# Patient Record
Sex: Male | Born: 1945
Health system: Southern US, Community
[De-identification: ages and names within clinical notes are randomized; demographics above are authoritative.]

## PROBLEM LIST (undated history)

## (undated) DIAGNOSIS — J189 Pneumonia, unspecified organism: Secondary | ICD-10-CM

## (undated) DIAGNOSIS — R39198 Other difficulties with micturition: Secondary | ICD-10-CM

## (undated) DIAGNOSIS — M1990 Unspecified osteoarthritis, unspecified site: Secondary | ICD-10-CM

## (undated) DIAGNOSIS — E119 Type 2 diabetes mellitus without complications: Secondary | ICD-10-CM

## (undated) DIAGNOSIS — I7121 Aneurysm of the ascending aorta, without rupture: Secondary | ICD-10-CM

## (undated) DIAGNOSIS — I2510 Atherosclerotic heart disease of native coronary artery without angina pectoris: Secondary | ICD-10-CM

## (undated) DIAGNOSIS — E785 Hyperlipidemia, unspecified: Secondary | ICD-10-CM

## (undated) DIAGNOSIS — I10 Essential (primary) hypertension: Secondary | ICD-10-CM

## (undated) DIAGNOSIS — G4730 Sleep apnea, unspecified: Secondary | ICD-10-CM

## (undated) DIAGNOSIS — I272 Pulmonary hypertension, unspecified: Secondary | ICD-10-CM

## (undated) DIAGNOSIS — Z8639 Personal history of other endocrine, nutritional and metabolic disease: Secondary | ICD-10-CM

## (undated) DIAGNOSIS — R413 Other amnesia: Secondary | ICD-10-CM

## (undated) DIAGNOSIS — I712 Thoracic aortic aneurysm, without rupture: Secondary | ICD-10-CM

## (undated) DIAGNOSIS — K219 Gastro-esophageal reflux disease without esophagitis: Secondary | ICD-10-CM

## (undated) DIAGNOSIS — Z87442 Personal history of urinary calculi: Secondary | ICD-10-CM

## (undated) HISTORY — DX: Hyperlipidemia, unspecified: E78.5

## (undated) HISTORY — DX: Gastro-esophageal reflux disease without esophagitis: K21.9

## (undated) HISTORY — DX: Atherosclerotic heart disease of native coronary artery without angina pectoris: I25.10

## (undated) HISTORY — DX: Other amnesia: R41.3

## (undated) HISTORY — DX: Essential (primary) hypertension: I10

## (undated) HISTORY — DX: Type 2 diabetes mellitus without complications: E11.9

## (undated) HISTORY — DX: Other difficulties with micturition: R39.198

## (undated) HISTORY — PX: LITHOTRIPSY: SUR834

---

## 1999-07-31 ENCOUNTER — Encounter: Payer: Self-pay | Admitting: Orthopedic Surgery

## 1999-07-31 ENCOUNTER — Ambulatory Visit (HOSPITAL_COMMUNITY): Admission: RE | Admit: 1999-07-31 | Discharge: 1999-07-31 | Payer: Self-pay | Admitting: Orthopedic Surgery

## 1999-09-12 ENCOUNTER — Ambulatory Visit (HOSPITAL_COMMUNITY): Admission: RE | Admit: 1999-09-12 | Discharge: 1999-09-12 | Payer: Self-pay | Admitting: Orthopedic Surgery

## 2003-09-18 ENCOUNTER — Encounter: Admission: RE | Admit: 2003-09-18 | Discharge: 2003-12-17 | Payer: Self-pay | Admitting: Endocrinology

## 2004-01-10 ENCOUNTER — Encounter: Admission: RE | Admit: 2004-01-10 | Discharge: 2004-01-10 | Payer: Self-pay | Admitting: Endocrinology

## 2005-01-28 ENCOUNTER — Encounter: Admission: RE | Admit: 2005-01-28 | Discharge: 2005-04-28 | Payer: Self-pay | Admitting: Endocrinology

## 2005-08-08 ENCOUNTER — Ambulatory Visit (HOSPITAL_BASED_OUTPATIENT_CLINIC_OR_DEPARTMENT_OTHER): Admission: RE | Admit: 2005-08-08 | Discharge: 2005-08-08 | Payer: Self-pay | Admitting: Otolaryngology

## 2005-08-16 ENCOUNTER — Ambulatory Visit: Payer: Self-pay | Admitting: Internal Medicine

## 2005-09-11 ENCOUNTER — Encounter (INDEPENDENT_AMBULATORY_CARE_PROVIDER_SITE_OTHER): Payer: Self-pay | Admitting: *Deleted

## 2005-09-11 ENCOUNTER — Ambulatory Visit (HOSPITAL_COMMUNITY): Admission: RE | Admit: 2005-09-11 | Discharge: 2005-09-11 | Payer: Self-pay | Admitting: *Deleted

## 2007-08-10 ENCOUNTER — Observation Stay (HOSPITAL_COMMUNITY): Admission: AD | Admit: 2007-08-10 | Discharge: 2007-08-11 | Payer: Self-pay | Admitting: Otolaryngology

## 2007-08-24 ENCOUNTER — Encounter: Admission: RE | Admit: 2007-08-24 | Discharge: 2007-08-24 | Payer: Self-pay | Admitting: Otolaryngology

## 2008-10-05 ENCOUNTER — Ambulatory Visit (HOSPITAL_COMMUNITY): Admission: RE | Admit: 2008-10-05 | Discharge: 2008-10-05 | Payer: Self-pay | Admitting: Surgery

## 2008-10-10 ENCOUNTER — Ambulatory Visit (HOSPITAL_COMMUNITY): Admission: RE | Admit: 2008-10-10 | Discharge: 2008-10-10 | Payer: Self-pay | Admitting: Surgery

## 2008-10-12 ENCOUNTER — Encounter: Admission: RE | Admit: 2008-10-12 | Discharge: 2009-01-10 | Payer: Self-pay | Admitting: Surgery

## 2008-12-11 ENCOUNTER — Ambulatory Visit (HOSPITAL_COMMUNITY): Admission: RE | Admit: 2008-12-11 | Discharge: 2008-12-12 | Payer: Self-pay | Admitting: Surgery

## 2008-12-11 HISTORY — PX: LAPAROSCOPIC GASTRIC BANDING: SHX1100

## 2008-12-11 HISTORY — PX: GASTRIC RESTRICTION SURGERY: SHX653

## 2009-02-05 ENCOUNTER — Encounter: Admission: RE | Admit: 2009-02-05 | Discharge: 2009-05-06 | Payer: Self-pay | Admitting: Surgery

## 2009-06-03 ENCOUNTER — Encounter: Admission: RE | Admit: 2009-06-03 | Discharge: 2009-07-03 | Payer: Self-pay | Admitting: Surgery

## 2009-08-30 ENCOUNTER — Ambulatory Visit (HOSPITAL_COMMUNITY): Admission: EM | Admit: 2009-08-30 | Discharge: 2009-08-31 | Payer: Self-pay | Admitting: Emergency Medicine

## 2010-06-24 ENCOUNTER — Encounter
Admission: RE | Admit: 2010-06-24 | Discharge: 2010-06-24 | Payer: Self-pay | Source: Home / Self Care | Attending: Surgery | Admitting: Surgery

## 2010-09-26 LAB — DIFFERENTIAL
Basophils Absolute: 0.2 10*3/uL — ABNORMAL HIGH (ref 0.0–0.1)
Basophils Relative: 1 % (ref 0–1)
Basophils Relative: 2 % — ABNORMAL HIGH (ref 0–1)
Eosinophils Absolute: 0 10*3/uL (ref 0.0–0.7)
Eosinophils Relative: 1 % (ref 0–5)
Lymphocytes Relative: 17 % (ref 12–46)
Monocytes Absolute: 0.9 10*3/uL (ref 0.1–1.0)
Neutro Abs: 11.3 10*3/uL — ABNORMAL HIGH (ref 1.7–7.7)
Neutrophils Relative %: 73 % (ref 43–77)
Neutrophils Relative %: 84 % — ABNORMAL HIGH (ref 43–77)

## 2010-09-26 LAB — CBC
HCT: 39.8 % (ref 39.0–52.0)
HCT: 39.8 % (ref 39.0–52.0)
HCT: 46.2 % (ref 39.0–52.0)
Hemoglobin: 13.5 g/dL (ref 13.0–17.0)
Hemoglobin: 15.9 g/dL (ref 13.0–17.0)
MCHC: 33.8 g/dL (ref 30.0–36.0)
MCHC: 34.2 g/dL (ref 30.0–36.0)
MCV: 93.3 fL (ref 78.0–100.0)
MCV: 93.8 fL (ref 78.0–100.0)
MCV: 94.3 fL (ref 78.0–100.0)
Platelets: 170 10*3/uL (ref 150–400)
RBC: 4.27 MIL/uL (ref 4.22–5.81)
RBC: 4.92 MIL/uL (ref 4.22–5.81)
RDW: 11.5 % (ref 11.5–15.5)
RDW: 11.9 % (ref 11.5–15.5)
WBC: 9.4 10*3/uL (ref 4.0–10.5)

## 2010-09-26 LAB — BASIC METABOLIC PANEL
CO2: 25 mEq/L (ref 19–32)
CO2: 34 mEq/L — ABNORMAL HIGH (ref 19–32)
Calcium: 9.6 mg/dL (ref 8.4–10.5)
Chloride: 109 mEq/L (ref 96–112)
GFR calc Af Amer: >60 mL/min (ref >60)
Glucose, Bld: 139 mg/dL — ABNORMAL HIGH (ref 70–99)
Potassium: 4.3 mEq/L (ref 3.5–5.1)
Sodium: 148 mEq/L — ABNORMAL HIGH (ref 135–145)

## 2010-10-13 LAB — COMPREHENSIVE METABOLIC PANEL
AST: 23 U/L (ref 0–37)
Albumin: 4.2 g/dL (ref 3.5–5.2)
CO2: 27 mEq/L (ref 19–32)
Chloride: 105 mEq/L (ref 96–112)
Creatinine, Ser: 0.98 mg/dL (ref 0.4–1.5)
GFR calc Af Amer: >60 mL/min (ref >60)
Sodium: 138 mEq/L (ref 135–145)
Total Bilirubin: 0.6 mg/dL (ref 0.3–1.2)

## 2010-10-13 LAB — CBC
HCT: 37.3 % — ABNORMAL LOW (ref 39.0–52.0)
Hemoglobin: 12.5 g/dL — ABNORMAL LOW (ref 13.0–17.0)
MCHC: 33.2 g/dL (ref 30.0–36.0)
MCHC: 33.6 g/dL (ref 30.0–36.0)
MCV: 94.4 fL (ref 78.0–100.0)
Platelets: 194 10*3/uL (ref 150–400)
Platelets: 237 10*3/uL (ref 150–400)
RDW: 12.6 % (ref 11.5–15.5)
WBC: 6 10*3/uL (ref 4.0–10.5)

## 2010-10-13 LAB — HEMOGLOBIN AND HEMATOCRIT, BLOOD: Hemoglobin: 12.7 g/dL — ABNORMAL LOW (ref 13.0–17.0)

## 2010-10-13 LAB — GLUCOSE, CAPILLARY
Glucose-Capillary: 113 mg/dL — ABNORMAL HIGH (ref 70–99)
Glucose-Capillary: 141 mg/dL — ABNORMAL HIGH (ref 70–99)
Glucose-Capillary: 89 mg/dL (ref 70–99)

## 2010-10-13 LAB — DIFFERENTIAL
Basophils Relative: 0 % (ref 0–1)
Basophils Relative: 0 % (ref 0–1)
Lymphocytes Relative: 8 % — ABNORMAL LOW (ref 12–46)
Lymphs Abs: 0.7 10*3/uL (ref 0.7–4.0)
Monocytes Absolute: 0.5 10*3/uL (ref 0.1–1.0)
Monocytes Absolute: 0.5 10*3/uL (ref 0.1–1.0)
Neutrophils Relative %: 65 % (ref 43–77)

## 2010-11-14 ENCOUNTER — Other Ambulatory Visit: Payer: Self-pay | Admitting: Surgery

## 2010-11-14 DIAGNOSIS — K56609 Unspecified intestinal obstruction, unspecified as to partial versus complete obstruction: Secondary | ICD-10-CM

## 2010-11-18 ENCOUNTER — Ambulatory Visit
Admission: RE | Admit: 2010-11-18 | Discharge: 2010-11-18 | Disposition: A | Discharge status: Home / Self Care | Payer: Medicare Other | Source: Ambulatory Visit | Attending: Surgery | Admitting: Surgery

## 2010-11-18 ENCOUNTER — Other Ambulatory Visit: Payer: Self-pay | Admitting: Surgery

## 2010-11-18 DIAGNOSIS — K56609 Unspecified intestinal obstruction, unspecified as to partial versus complete obstruction: Secondary | ICD-10-CM

## 2010-11-18 NOTE — Op Note (Signed)
NAME:  [name] [name], [name] [name]                 ACCOUNT NO.:  [id number]   MEDICAL RECORD NO.:  [id number]          PATIENT TYPE:  INP   LOCATION:  2550                         FACILITY:  [facility]   PHYSICIAN:  Kristine Garbe. Ezzard Standing, M.D.DATE OF BIRTH:  September 14, 1945   DATE OF PROCEDURE:  08/10/2007  DATE OF DISCHARGE:                               OPERATIVE REPORT   PREOPERATIVE DIAGNOSES:  1. Septal deformity with turbinate hypertrophy and rhinitis      medicamentosa with chronic nasal obstruction.  2. Obstructive sleep apnea.   POSTOPERATIVE DIAGNOSES:  1. Septal deformity with turbinate hypertrophy and rhinitis      medicamentosa with chronic nasal obstruction.  2. Obstructive sleep apnea.   OPERATION PERFORMED:  1. Septoplasty.  2. Bilateral inferior turbinate reductions.  3. Steroid injection 80 mg of Depo-Medrol.   SURGEON:  Narda Bonds, M.D.   ANESTHESIA:  General endotracheal.   COMPLICATIONS:  None.   CLINICAL NOTE:  [name] is a 65 year old gentleman who has severe  obstructive sleep apnea.  He had been using nasal CPAP and sleeping  better, but has had chronic problems with nasal obstruction.  He is  presently addicted to decongestant nasal spray using it three to four  times a day in order to breathe.  He had a moderate septal deflection to  the right with enlarged turbinates and diffusely enlarged nasal mucosa  consistent with a rhinitis medicamentosa.  He is taken to operating room  at this time for septoplasty, turbinate ductions and steroid injection.   DESCRIPTION OF PROCEDURE:  After adequate endotracheal anesthesia the  nose was prepped with cotton pledgets soaked in Afrin for decongestive  the nose.  The patient had poor response to decongestant therapy and  diffuse mucosal swelling of all the nasal mucosa making visualization of  the middle turbinates impossible.  The septum and turbinates were  injected with Xylocaine with epinephrine for hemostasis.  After  opening  the nose with a long nasal speculums the middle meatus and middle  turbinates were evaluated.  There no polyps although they were diffusely  edematous.  Nasal passages were otherwise clear with no obstructing  lesions noted.  He did have a moderate septal deflection to the right.  Next a hemitransfixion incision was performed on the right side of the  caudal edge of the septum.  Mucoperiosteal and mucoperichondrial flaps  elevated posteriorly.  A portion of the cartilaginous and bony septum  deviated to the right was removed after elevating mucoperiosteal  mucoperichondrial flaps either side.  This completed the septoplasty  portion of procedure.  Next the inferior turbinates were reduced.  Using  turbinate scissors the lower one half of the turbinates were amputated  bilaterally.  Suction cautery was used to cauterize the mucosa  posteriorly that was bleeding and the remaining turbinate tissue was  outfractured.  Following this 80 mg of Depo-Medrol were injected into  the middle turbinate and lateral nasal mucosa.  This completed the  procedure.  The hemitransfixion incision was closed with interrupted 4-0  chromic sutures.  The septum was basted with a 3-0 chromic  suture.  Splints were secured either side of the septum with a 4-0 nylon suture  and the nose was packed with Telfa soaked in bacitracin ointment  bilaterally.  Loyde tolerated this well was awoke from anesthesia and  transferred recovery room postop doing well.   DISPOSITION:  Gurveer will be observed overnight for 24-hour observation  and plan on discharge in morning after removing the nasal packs.           ______________________________  Kristine Garbe Ezzard Standing, M.D.     CEN/MEDQ  D:  08/10/2007  T:  08/11/2007  Job:  [id number]

## 2010-11-18 NOTE — Op Note (Signed)
NAME:  [name] [name], [name] [name]                 ACCOUNT NO.:  [id number]   MEDICAL RECORD NO.:  [id number]          PATIENT TYPE:  INP   LOCATION:  1527                         FACILITY:  [facility]   PHYSICIAN:  Thornton Park. Daphine Deutscher, MD  DATE OF BIRTH:  27-Jul-1945   DATE OF PROCEDURE:  DATE OF DISCHARGE:                               OPERATIVE REPORT   PREOPERATIVE DIAGNOSIS:  Morbid obesity.   POSTOPERATIVE DIAGNOSIS:  Morbid obesity with a history of GERD.   This is a 65 year old white male with diabetes, GERD, BMI initially is  about 45, who presents for laparoscopic adjustable gastric banding.  He  was taken to room 1 on the afternoon of Tuesday, December 11, 2008, and given  general anesthesia.  The abdomen was prepped with Technicare equivalent  and draped sterilely.  Access to the abdomen was gained through the left  upper quadrant with a 0-degree OptiVu scope without difficulty.  Once  the abdomen was insufflated standard ports were used including a 15 in  the right upper quadrant.  All ports were placed obliquely.  Liver  retractor was placed and this provided good visualization of the upper  abdomen.  First I dissected his left side on the left crus and then went  over on the right side and he had a very fatty apron between his  gastrohepatic window and a large vein that coursing across that.  I went  beneath that and went up, and because of his history of GERD, went ahead  and mobilized the retroesophageal region.  He had evidence of soft of a  sticky esophagitis and I went ahead and dissected that.  There was a lot  of wall of fat there.  We used the tubing with a balloon to demonstrate  the esophagus and assess his hiatal hernia.  He had an upper GI that did  showed a small sliding hiatal hernia but more poorly he had indicated me  that he had symptoms.  I went ahead and isolated the right and left  crura and then placed a single suture with the Endostitch and I secured  it with a tie knot.   I then used the band passer.  __________ placed my ports away above his  umbilicus. Band passer went around.  I had to press in a little bit but  I was able to get it around.  I could see it coming through the free  spaces that I had previously cleared off and then it came on through.  I  introduced the APL band system and threaded it through the band passer  with __________ stitch in place to help pull it around.  Once that was  around I secured it into the buckle, snapped it over the tubing.  The  tubing was withdrawn.  The band was then secured anteriorly by plicated  the stomach up to the proximal pouch with three sutures held in place  with tie knots.  There was essentially no bleeding.  I went ahead and  retrieved the tip of the tubing, brought it out to the lower  port on the  right and placed to  the lap band, Port-A-Cath port which had some mesh placed on the  backside it.  This was tucked in the subcutaneous pocket.  All wounds  were irrigated and closed 4-0 Vicryl, Benzoin Steri-Strips.  The patient  seemed to tolerate the procedure well and was taken to recovery room in  satisfactory condition.      Thornton Park Daphine Deutscher, MD  Electronically Signed     MBM/MEDQ  D:  12/11/2008  T:  12/12/2008  Job:  [id number]   cc:   Claudette Stapler  Fax: [phone]   Gaspar Garbe, M.D.  Fax: [phone]

## 2010-11-21 NOTE — Procedures (Signed)
NAME:  [name] [name], [name] [name]                 ACCOUNT NO.:  [id number]   MEDICAL RECORD NO.:  [id number]          PATIENT TYPE:  OUT   LOCATION:  SLEEP CENTER                 FACILITY:  [facility]   PHYSICIAN:  Clinton D. Maple Hudson, M.D. DATE OF BIRTH:  May 08, 1946   DATE OF STUDY:  08/08/2005                              NOCTURNAL POLYSOMNOGRAM   REFERRING PHYSICIAN:  Dr. Dillard Cannon.   DATE OF STUDY:  August 08, 2005.   INDICATION FOR STUDY:  Hypersomnia with sleep apnea.   EPWORTH SLEEPINESS SCORE:  11/24.   BMI:  43.   WEIGHT:  320 pounds.   HOME MEDICATIONS:  Aspirin, Prevacid, Limitrol.   SLEEP ARCHITECTURE:  Total sleep time 413 minutes with sleep efficiency 88%.  Stage I was 19%, stage II 61%, stages III and IV 1%, REM 20% of total sleep  time. Sleep latency 6 minutes, REM latency 125 minutes, awake after sleep  onset 53 minutes, arousal index 33. No bedtime medication taken.   RESPIRATORY DATA:  Split study protocol: Apnea/hypopnea index (AHI, RDI)  88.4 obstructive events per hour indicating severe obstructive sleep  apnea/hypopnea syndrome. This included 171 obstructive apneas and 56  hypopneas before C-PAP. Events were not positional. REM AHI 28.5. C-PAP  titration was taken up to 18 CWP. Best control appeared to be at 12 CWP, AHI  0 per hour. A small Respironics ComfortGel nasal mask was used. Higher  pressures were associated with nasal congestion and air leak requiring chin  strap.   OXYGEN DATA:  Moderately loud snoring with oxygen desaturation to a nadir of  53% before C-PAP control. After C-PAP saturation held 98-99% on room air.   CARDIAC DATA:  Normal sinus rhythm.   MOVEMENT/PARASOMNIA:  Frequent leg jerks but few were associated with  arousal or awakening. Bathroom x1.   IMPRESSION/RECOMMENDATIONS:  1.  Severe obstructive sleep apnea/hypopnea syndrome, AHI 88.4 per hour with      nonpositional events, moderately loud snoring and oxygen desaturation to  53%.  2.  Successful C-PAP titration at 12 CWP, AHI 0 per hour. Higher pressures      were tried but were associated with      leaks requiring chin strap and appeared to be associated with      progressive nasal congestion. A small Respironics ComfortGel nasal mask      was used with heated humidifier.      Clinton D. Maple Hudson, M.D.  Diplomate, Biomedical engineer of Sleep Medicine  Electronically Signed     CDY/MEDQ  D:  08/16/2005 09:07:00  T:  08/16/2005 22:18:35  Job:  [id number]

## 2010-11-21 NOTE — Op Note (Signed)
NAME:  [name] [name], [name] [name]                 ACCOUNT NO.:  [id number]   MEDICAL RECORD NO.:  [id number]          PATIENT TYPE:  AMB   LOCATION:  ENDO                         FACILITY:  [facility]   PHYSICIAN:  Georgiana Spinner, M.D.    DATE OF BIRTH:  1945/10/19   DATE OF PROCEDURE:  09/11/2005  DATE OF DISCHARGE:                                 OPERATIVE REPORT   PROCEDURE:  Colonoscopy.   ENDOSCOPIST:  Georgiana Spinner, M.D.   INDICATIONS:  Colon cancer screening.   ANESTHESIA:  None further given.   PROCEDURE:  With the patient mildly sedated in the left lateral decubitus  position, the Olympus videoscopic colonoscope was inserted into the rectum  and passed under direct vision to the cecum, identified by ileocecal valve  and appendiceal orifice, both which were photographed.  From this point, the  colonoscope was slowly withdrawn, taking circumferential views of the  colonic mucosa, stopping in the rectum, which appeared normal on direct and  retroflexed view.  The endoscope was straightened and withdrawn.  The  patient's vital signs and pulse oximetry remained stable.  The patient  tolerated the procedure well without apparent complications.   FINDINGS:  Unremarkable examination.   PLAN:  See endoscopy note for further details.           ______________________________  Georgiana Spinner, M.D.     GMO/MEDQ  D:  09/11/2005  T:  09/12/2005  Job:  [id number]

## 2010-11-21 NOTE — Op Note (Signed)
NAME:  Ferrall, Demoni                 ACCOUNT NO.:  [id number]   MEDICAL RECORD NO.:  [id number]          PATIENT TYPE:  AMB   LOCATION:  ENDO                         FACILITY:  [facility]   PHYSICIAN:  Georgiana Spinner, M.D.    DATE OF BIRTH:  03/03/1946   DATE OF PROCEDURE:  09/11/2005  DATE OF DISCHARGE:                                 OPERATIVE REPORT   PROCEDURE:  Upper endoscopy.   ENDOSCOPIST:  Georgiana Spinner, M.D.   INDICATIONS:  GERD.   ANESTHESIA:  Demerol 50 mg, Versed 4 mg.   PROCEDURE:  With the patient mildly sedated in the left lateral decubitus  position, the Olympus videoscopic endoscope was inserted in the mouth and  passed under direct vision through the esophagus, which appeared normal.  There was no evidence of Barrett's.  We entered into the stomach; fundus,  body and antrum were visualized and the antrum appeared slightly thickened  in the prepyloric so this was biopsied.  We advanced to the duodenal bulb  and second portion of duodenum, both of which appeared normal.  From this  point, the endoscope was slowly withdrawn, taking circumferential views of  the duodenal mucosa, until the endoscope had been pulled back into the  stomach and placed in retroflexion to view the stomach from below.  The  endoscope was then straightened and withdrawn, taking circumferential views  of the remaining gastric and esophageal mucosa.  The patient's vital signs  and pulse oximetry remained stable.  The patient tolerated the procedure  well without apparent complication.   FINDINGS:  Mildly thickened antrum, biopsied, await biopsy report.  The  patient will call me for results and follow up with me as an outpatient.  Proceed to colonoscopy.           ______________________________  Georgiana Spinner, M.D.     GMO/MEDQ  D:  09/11/2005  T:  09/12/2005  Job:  [id number]

## 2010-12-22 ENCOUNTER — Emergency Department (HOSPITAL_COMMUNITY): Payer: Medicare Other

## 2010-12-22 ENCOUNTER — Inpatient Hospital Stay (HOSPITAL_COMMUNITY)
Admission: EM | Admit: 2010-12-22 | Discharge: 2010-12-24 | DRG: 989 | Disposition: A | Discharge status: Home / Self Care | Payer: Medicare Other | Attending: General Surgery | Admitting: General Surgery

## 2010-12-22 DIAGNOSIS — E860 Dehydration: Secondary | ICD-10-CM | POA: Diagnosis present

## 2010-12-22 DIAGNOSIS — E669 Obesity, unspecified: Secondary | ICD-10-CM | POA: Diagnosis present

## 2010-12-22 DIAGNOSIS — K319 Disease of stomach and duodenum, unspecified: Secondary | ICD-10-CM | POA: Diagnosis present

## 2010-12-22 DIAGNOSIS — I10 Essential (primary) hypertension: Secondary | ICD-10-CM | POA: Diagnosis present

## 2010-12-22 DIAGNOSIS — K9509 Other complications of gastric band procedure: Principal | ICD-10-CM | POA: Diagnosis present

## 2010-12-22 DIAGNOSIS — Z7982 Long term (current) use of aspirin: Secondary | ICD-10-CM

## 2010-12-22 DIAGNOSIS — Z9884 Bariatric surgery status: Secondary | ICD-10-CM

## 2010-12-22 DIAGNOSIS — K219 Gastro-esophageal reflux disease without esophagitis: Secondary | ICD-10-CM | POA: Diagnosis present

## 2010-12-22 DIAGNOSIS — Z79899 Other long term (current) drug therapy: Secondary | ICD-10-CM

## 2010-12-22 LAB — CBC
HCT: 39.1 % (ref 39.0–52.0)
MCHC: 33.8 g/dL (ref 30.0–36.0)
MCV: 91.4 fL (ref 78.0–100.0)
Platelets: 172 10*3/uL (ref 150–400)
RDW: 12.2 % (ref 11.5–15.5)
WBC: 6.8 10*3/uL (ref 4.0–10.5)

## 2010-12-22 LAB — DIFFERENTIAL
Basophils Relative: 1 % (ref 0–1)
Eosinophils Absolute: 0.4 10*3/uL (ref 0.0–0.7)
Neutrophils Relative %: 57 % (ref 43–77)

## 2010-12-22 LAB — BASIC METABOLIC PANEL
BUN: 14 mg/dL (ref 6–23)
Calcium: 9.4 mg/dL (ref 8.4–10.5)
Chloride: 102 mEq/L (ref 96–112)
Creatinine, Ser: 0.56 mg/dL (ref 0.50–1.35)
GFR calc Af Amer: >60 mL/min (ref >60)
GFR calc non Af Amer: >60 mL/min (ref >60)

## 2010-12-22 LAB — GLUCOSE, CAPILLARY: Glucose-Capillary: 137 mg/dL — ABNORMAL HIGH (ref 70–99)

## 2010-12-23 LAB — CBC
MCH: 30.8 pg (ref 26.0–34.0)
MCHC: 33.2 g/dL (ref 30.0–36.0)
Platelets: 158 10*3/uL (ref 150–400)
RDW: 12.2 % (ref 11.5–15.5)

## 2010-12-23 LAB — BASIC METABOLIC PANEL
Calcium: 8.7 mg/dL (ref 8.4–10.5)
GFR calc Af Amer: >60 mL/min (ref >60)
GFR calc non Af Amer: >60 mL/min (ref >60)
Glucose, Bld: 92 mg/dL (ref 70–99)
Potassium: 3.8 mEq/L (ref 3.5–5.1)
Sodium: 141 mEq/L (ref 135–145)

## 2010-12-25 ENCOUNTER — Encounter (INDEPENDENT_AMBULATORY_CARE_PROVIDER_SITE_OTHER): Payer: Self-pay | Admitting: Surgery

## 2011-01-05 NOTE — H&P (Signed)
NAME:  [name] [name], [name] [name]                 ACCOUNT NO.:  [id number]  MEDICAL RECORD NO.:  [id number]  LOCATION:  1525                         FACILITY:  [facility]  PHYSICIAN:  Sharlet Salina T. Jessa Stinson, M.D.DATE OF BIRTH:  Jan 21, 1946  DATE OF ADMISSION:  12/22/2010 DATE OF DISCHARGE:  12/24/2010                             HISTORY & PHYSICAL   CHIEF COMPLAINT:  Abdominal pain.  HISTORY OF PRESENT ILLNESS:  [name] [name] is a 65 year old gentleman, who had a lap band in 2010, subsequently had a revision in 2011, who has had more recent issues with nausea and vomiting secondary to the stricture at the band site.  He had a previous upper GI in May that showed no change in position of the band, but some delayed passage through the band.  He is yet to follow up with Dr. Daphine Deutscher for that and was scheduled to in the upcoming weeks; however, over the past few days to a week, patient has had worsening nausea and poor toleration for even liquids. He has had increased abdominal discomfort and decreased urine output secondary to poor oral intake.  He was asked to come up to the emergency department due to the severity of his pain and symptoms, and we were asked to evaluate the patient here.  He otherwise denies any fever, chills.  He denies any chest pain or shortness of breath.  Denies any dysuria or hematuria.  PAST MEDICAL HISTORY:  Significant for: 1. Obesity. 2. Gastroesophageal reflux disease. 3. Hypertension.  PAST SURGICAL HISTORY:  As mentioned laparoscopic band in 2010, revision is 2011.  FAMILY HISTORY:  Noncontributory to the present case.  SOCIAL HISTORY:  Patient denies any alcohol, tobacco or illicit drug use.  He is married.  DRUG ALLERGIES:  No known drug or latex allergies.  MEDICATIONS:  Include: 1. Aspirin. 2. Micardis. 3. Multivitamin. 4. Lasix. 5. __________.  REVIEW OF SYSTEMS:  Please see history of present illness for pertinent findings, otherwise complete system review  found negative.  PHYSICAL EXAMINATION:  GENERAL:  Reveals a 65 year old gentleman, in no acute distress at present time, nontoxic-appearing. VITAL SIGNS:  Temperature of 98.4, heart rate of 63, respiratory rate of 20, blood pressure 161/92, oxygen saturation 99% on room air. ENT:  Unremarkable. NECK:  Supple without lymphadenopathy.  Trachea is midline.  No thyromegaly or masses. LUNGS:  Clear to auscultation.  No wheezes, rhonchi, or rales.  Normal respiratory effort without use of accessory muscles. HEART:  Regular rate and rhythm.  No murmurs, gallops, or rubs. Carotids are 2+ and brisk without bruits.  Peripheral pulses intact and symmetrical. ABDOMEN:  Soft, nondistended.  Patient has a palpable port in the right mid abdomen consistent with surgical history.  This is nontender.  No mass effect or hernias are appreciated. RECTAL:  Deferred. SKIN:  Otherwise warm and dry with good turgor.  No rashes, lesions, or nodules. NEUROLOGIC:  The patient is alert and oriented.  DIAGNOSTIC DATA:  CBC shows a white blood cell count of 6.8, hemoglobin of 13.2, hematocrit of 39.1, platelet count of 172,000.  Metabolic panel shows a sodium of 139, potassium of 3.8, chloride of 102, CO2 of 27, BUN of 14, creatinine  of 0.5, glucose of 101.  DIAGNOSTIC STUDIES:  Plain films of the abdomen and pelvis show significant malpositioning of laparoscopic band compared with previous imaging.  IMPRESSION: 1. Abdominal pain and nausea and vomiting. 2. Dehydration secondary to abdominal pain, nausea and vomiting. 3. Band slip.  PLAN:  We will admit the patient and begin IV fluid rehydration and discussed this case with Dr. Johna Sheriff, who agrees with admission.  We will discuss the possible surgical intervention with the patient.     Brayton El, PA-C   ______________________________ Lorne Skeens. Laryn Venning, M.D.    Corky Downs  D:  12/24/2010  T:  12/24/2010  Job:  [id number]  Electronically Signed  by Brayton El  on 12/29/2010 02:44:11 PM Electronically Signed by Glenna Fellows M.D. on 01/05/2011 09:44:51 AM

## 2011-01-05 NOTE — Op Note (Signed)
NAME:  [name] [name], [name] [name]                 ACCOUNT NO.:  [id number]  MEDICAL RECORD NO.:  [id number]  LOCATION:  1525                         FACILITY:  [facility]  PHYSICIAN:  Sharlet Salina T. Damiya Sandefur, M.D.DATE OF BIRTH:  [birth year]  DATE OF PROCEDURE:  12/22/2010 DATE OF DISCHARGE:                              OPERATIVE REPORT   PREOPERATIVE DIAGNOSIS:  Slipped lap band with obstruction.  POSTOPERATIVE DIAGNOSIS:  Slipped lap band with obstruction.  SURGICAL PROCEDURE:  Laparoscopy and unbuckling of slipped lap band.  SURGEON:  Lorne Skeens. Aija Scarfo, M.D.  ANESTHESIA:  General.  BRIEF HISTORY:  [name] [name] is a 66 year old male status post lap band placement by Dr. Daphine Deutscher in 2010.  He subsequently developed a slip and underwent revision and replication of his slipped lap band by Dr. Daphine Deutscher in February 2011.  In the last several months, he has had progressive problems with dysphagia.  He has had upper GI series showing some degree of slip with partial obstruction.  All the fluid was removed from his band several weeks ago, but he now presents with a week persistently worsening and now constant nausea, vomiting, intolerance of fluids, and some epigastric pain.  KUB today shows significant change in the position of the band since his last x-ray.  He is felt to have progressive slip with now complete obstruction and I have recommended urgent laparoscopy.  We have discussed options including removal, reciting, and unbuckling of his lap band.  He prefers to try to save the band if possible.  I have recommended unbuckling as he has had one previous revision and with acute obstruction, inflammation, and edema expected I felt that likely now was not the time to try to revise completely his band.  We discussed that the final decision would be based on operative findings.  Risks of bleeding, infection, intestinal injury, anesthetic complications were discussed and understood.  He is now brought to the  operating room for this procedure.  DESCRIPTION OF OPERATION:  The patient was brought to the operating room and placed supine position on the operating table.  General endotracheal anesthesia was induced.  The abdomen was widely sterilely prepped and draped.  He received preoperative IV antibiotics.  Correct patient and procedure were verified.  Access was obtained without difficulty with a 5-mm trocar in the left upper quadrant and pneumoperitoneum established. Under direct vision, using a previous trocar site incision, the 5 mm trocar was placed above the left umbilicus for the camera and two 5 mm trocars in the right upper quadrant.  Through a 5-mm subxiphoid site the [facility] retractor was placed and the left lobe of the liver elevated. There were no adhesions between the liver and the stomach and there was an excellent exposure.  There was an obvious large slip with a large tense portion of the stomach probably about a quarter of the stomach in the left upper quadrant and the band tightly constricting this again may be a quarter to a third of the way down on the stomach.  The stomach below the band was carefully retracted and the tubing grasped.  The imbrication sutures could be seen near the tubing and with careful sharp dissection  the previous imbrication was taken down.  I started medial along the tubing and then there were very dense adhesions in this area and the exposure was difficult and I was able to go more laterally where there was a thin membrane over the band and incised this and then worked back medially.  Suture material and time offs were seen as I separated the imbrication from the pouch.  It appeared that the imbrication was medial and that the slip was anterolateral with a large portion of the fundus and lateral stomach up above the band.  With careful tedious dissection, I was able to completely free the imbrication and expose the band, buckle, and tubing without  injury to the stomach or the band and then the band was unbuckled.  This appeared to release the blockage as I was unable to somewhat desufflate the upper herniated stomach pouch into the lower stomach.  However, the band did not slip easily up toward the upper stomach and as I examined along the greater curve it appeared that there was a fair amount of greater curve above the stomach laterally and that ideally to reposition the band I think it likely needs to be recited with the same entry site along the lesser curve, but working more up toward the angle of His.  I could see that there was an old cicatrix or scar across the upper stomach where the band likely was originally with a normal size pouch above this, but quite a lot of fundus and greater curve below this down to the current band site.  I therefore elected as I discussed with the patient to leave the band in place unbuckled with possible return in weeks to months after the distention and inflammation of the stomach had resolved for reciting if he chooses.  The abdomen was irrigated.  There was no evidence of bleeding or gastric injury.  The [employer] tractor was removed.  All CO2 evacuated and trocars were removed.  Skin incisions were closed with subcuticular Monocryl and Steri-Strips.  Sponge, needle, and instrument counts were correct.  The patient was taken to recovery in good condition.     Lorne Skeens. Amyrie Illingworth, M.D.     Tory Emerald  D:  12/22/2010  T:  12/23/2010  Job:  [id number]  Electronically Signed by Glenna Fellows M.D. on 01/05/2011 09:44:40 AM

## 2011-01-14 ENCOUNTER — Encounter (INDEPENDENT_AMBULATORY_CARE_PROVIDER_SITE_OTHER): Payer: Self-pay | Admitting: General Surgery

## 2011-01-15 ENCOUNTER — Encounter (INDEPENDENT_AMBULATORY_CARE_PROVIDER_SITE_OTHER): Payer: Self-pay | Admitting: Surgery

## 2011-01-15 ENCOUNTER — Ambulatory Visit (INDEPENDENT_AMBULATORY_CARE_PROVIDER_SITE_OTHER): Payer: Medicare Other | Admitting: Surgery

## 2011-01-15 VITALS — BP 158/92 | HR 62 | Ht 72.0 in | Wt 268.0 lb

## 2011-01-15 DIAGNOSIS — K602 Anal fissure, unspecified: Secondary | ICD-10-CM

## 2011-01-15 NOTE — Progress Notes (Signed)
Patient returns today having had his Lapband unbuckled by Dr. Johna Sheriff.  He had an anterior slip.   His main complaint today is rectal pain.  On exam he has edematous skin tags and clinically an anal fissure.   Will try both RECTIV (nitroglycerin 0.4%) and Diltiazem 2% to try to relieve his anal spasm.   Will need re siting of his Lapband in September

## 2011-01-19 NOTE — Discharge Summary (Addendum)
  NAME:  [name] [name], [name] [name]                 ACCOUNT NO.:  [id number]  MEDICAL RECORD NO.:  [id number]  LOCATION:  1525                         FACILITY:  [facility]  PHYSICIAN:  Sharlet Salina T. Hoxworth, M.D.DATE OF BIRTH:  1945-11-29  DATE OF ADMISSION:  12/22/2010 DATE OF DISCHARGE:  12/24/2010                              DISCHARGE SUMMARY   HISTORY OF PRESENT ILLNESS:  [name] [name] is a 65 year old gentleman who had lap-band in 2010, subsequently had revision in 2011.  He has had some recent issues with nausea and vomiting secondary to possible stricture at the band site.  He was being followed up by Dr. Daphine Deutscher, however, developed worsening nausea and poor toleration of p.o. intake. He presented to the emergency department due to the severity of his pain and upon plain film he was found have a significant band slip.  Dr. Johna Sheriff was asked to consult on this patient since he is familiar with the bariatric procedure.  SUMMARY OF HOSPITAL COURSE:  After admission from the emergency department, Dr. Johna Sheriff assumed the management of the patient, took him to the operating room on December 22, 2010, underwent laparoscopy with unbuckling of the slipped band.  Post procedure the patient had significant improvement in his symptoms.  Postop day #1, he was started on liquid diet as he was feeling significantly better.  On the next day, he was tolerating a little bit more liquids and Dr. Johna Sheriff felt he was appropriate for discharge home at that time.  DISCHARGE DIAGNOSES: 1. Band slip status post laparoscopy and unbuckling of band. 2. Dehydration secondary to nausea and vomiting - resolved.  DISCHARGE MEDICATIONS:  The patient will resume his home medications including aspirin, Micardis, multivitamin, and Lasix.     Brayton El, PA-C   ______________________________ Lorne Skeens. Hoxworth, M.D.    KB/MEDQ  D:  01/14/2011  T:  01/14/2011  Job:  [id number]  Electronically Signed by Glenna Fellows  M.D. on 01/19/2011 03:38:38 PM Electronically Signed by Brayton El  on 01/20/2011 04:14:05 PM

## 2011-03-02 IMAGING — RF DG UGI W/ KUB
14 series · 14 of 14 positions shown · non-contrast
Comparison: Upper GI dated 08/31/2009 and radiographs dated
08/30/2009 and 12/12/2008

CLINICAL DATA: Dysphagia.  Previous lap band surgery.

UPPER GI SERIES WITH KUB
TECHNIQUE: Routine upper GI series was performed with thin and
high density barium.
Fluoroscopy Time: 2.1 minutes slow pulsed fluoroscopy

[Series 1: run · 1 of 1 slices shown (1 of 13)]
[im 1/1]
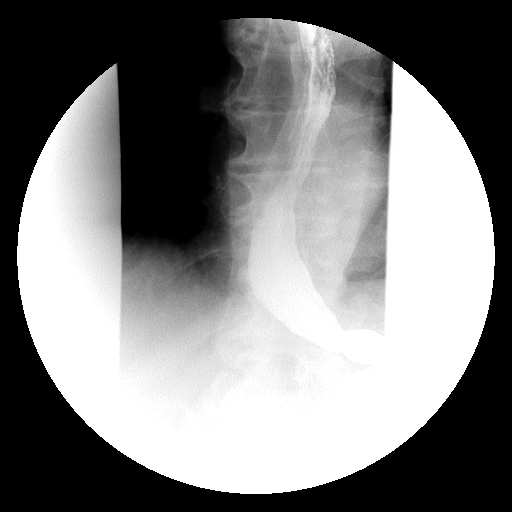

[Series 2: run · 1 of 1 slices shown (2 of 13)]
[im 1/1]
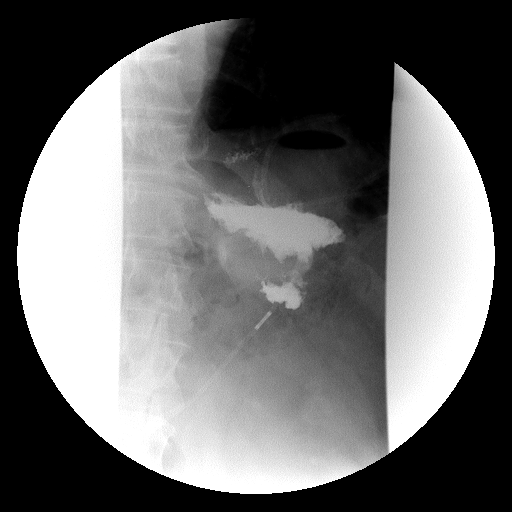

[Series 3: run · 1 of 1 slices shown (3 of 13)]
[im 1/1]
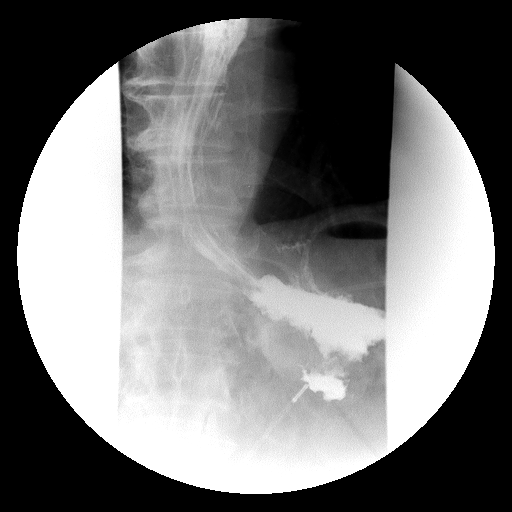

[Series 4: run · 1 of 1 slices shown (4 of 13)]
[im 1/1]
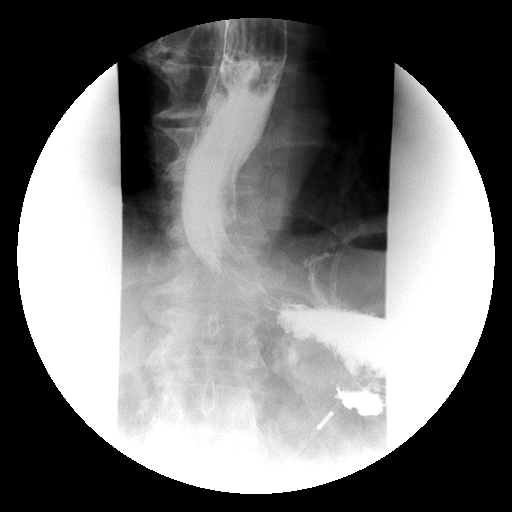

[Series 5: run · 1 of 1 slices shown (5 of 13)]
[im 1/1]
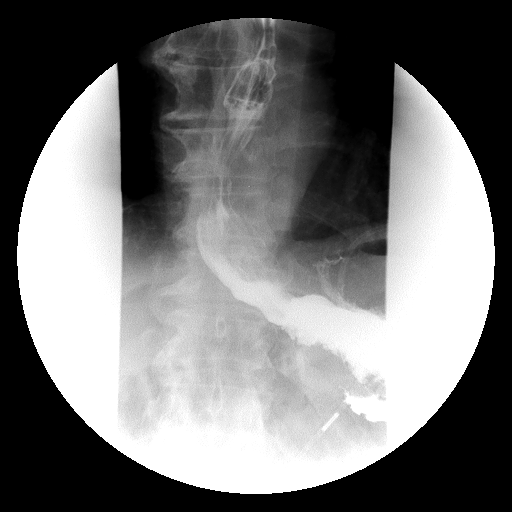

[Series 6: run · 1 of 1 slices shown (6 of 13)]
[im 1/1]
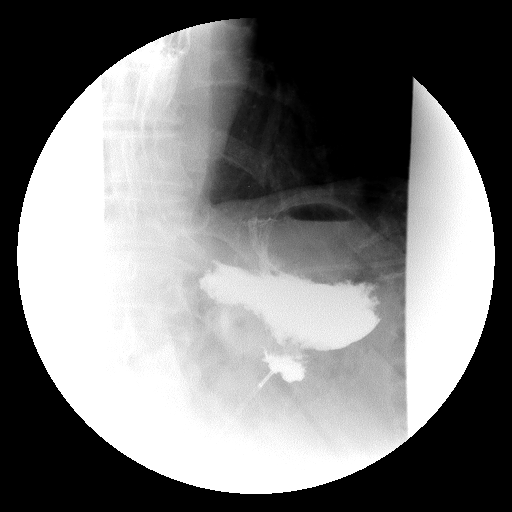

[Series 7: run · 1 of 1 slices shown (7 of 13)]
[im 1/1]
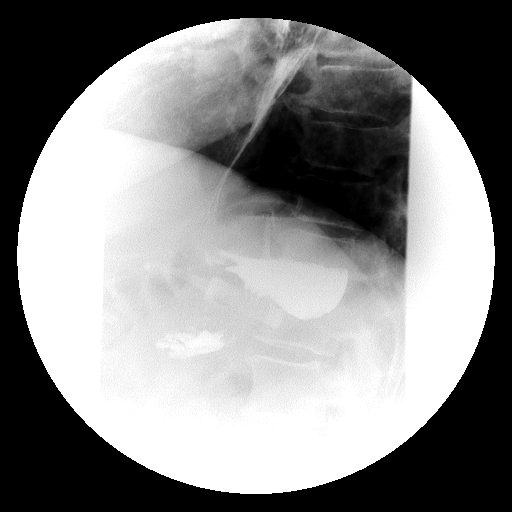

[Series 8: run · 1 of 1 slices shown (8 of 13)]
[im 1/1]
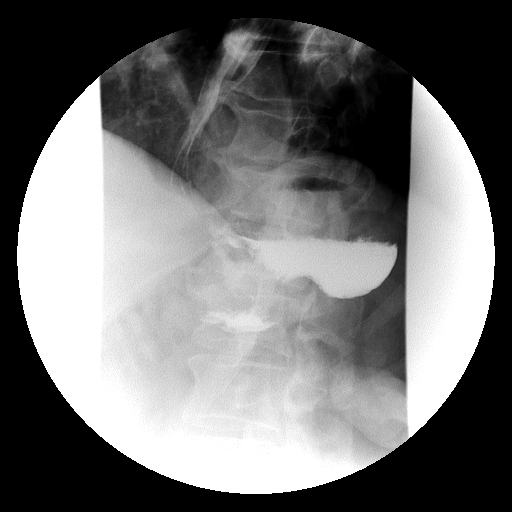

[Series 9: run · 1 of 1 slices shown (9 of 13)]
[im 1/1]
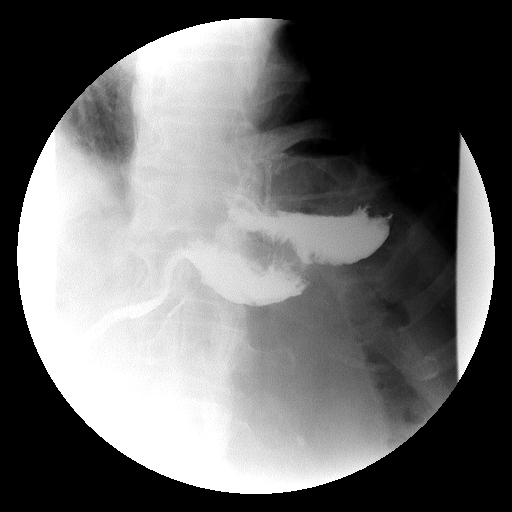

[Series 10: run · 1 of 1 slices shown (10 of 13)]
[im 1/1]
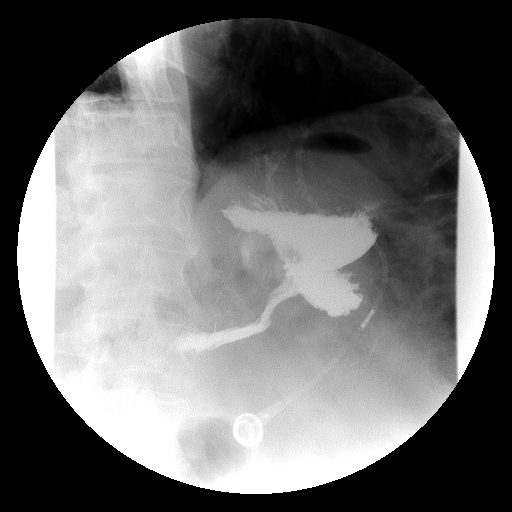

[Series 11: run · 1 of 1 slices shown (11 of 13)]
[im 1/1]
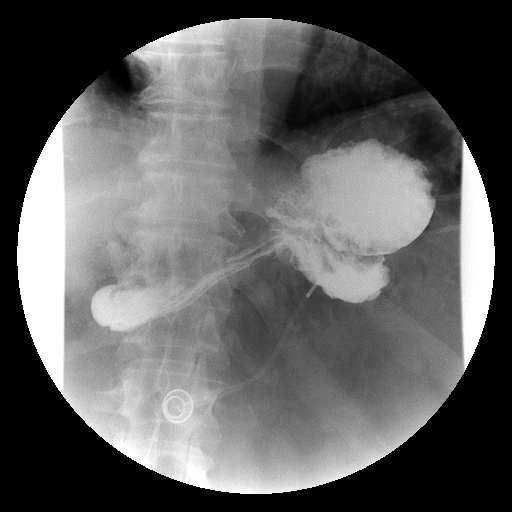

[Series 12: run · 1 of 1 slices shown (12 of 13)]
[im 1/1]
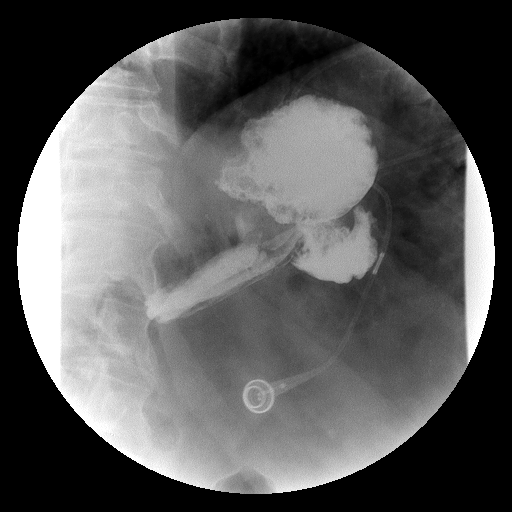

[Series 13: run · 1 of 1 slices shown (13 of 13)]
[im 1/1]
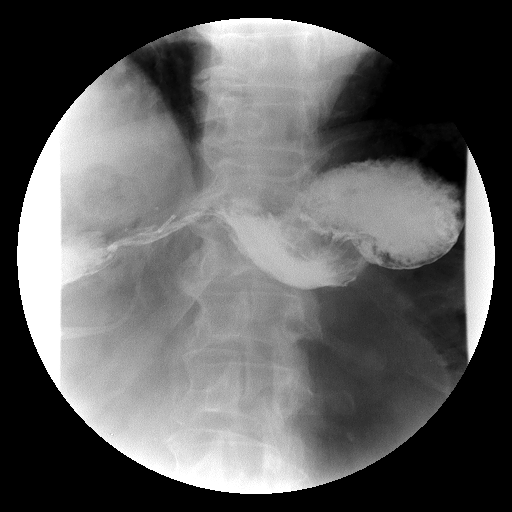

[Series 1001: view not recorded · 0.20mm/px · 1 of 1 slices shown]
[im 1/1]
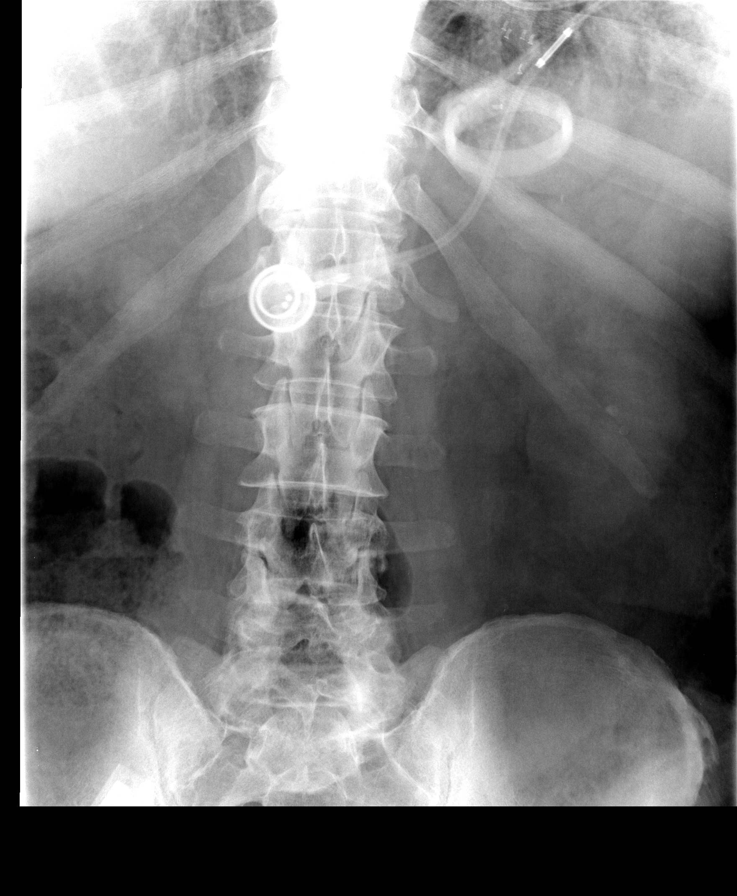

[14 of 14 positions shown; findings below may reference images not displayed]

FINDINGS: The KUB and  the upper GI images demonstrate an abnormal
orientation of the lap band.  The band has slipped distally along
the body of the stomach.  Contrast does flow through the band
without delay.  The distal esophagus appears normal.
IMPRESSION: The lap band has slipped distally along the body of the stomach.

## 2011-03-25 ENCOUNTER — Other Ambulatory Visit (INDEPENDENT_AMBULATORY_CARE_PROVIDER_SITE_OTHER): Payer: Self-pay | Admitting: Surgery

## 2011-03-25 ENCOUNTER — Encounter (HOSPITAL_COMMUNITY): Payer: Medicare Other

## 2011-03-25 LAB — CBC
Hemoglobin: 13.8 g/dL (ref 13.0–17.0)
MCH: 30.6 pg (ref 26.0–34.0)
Platelets: 165 10*3/uL (ref 150–400)
RBC: 4.51 MIL/uL (ref 4.22–5.81)

## 2011-03-25 LAB — BASIC METABOLIC PANEL
CO2: 28 mEq/L (ref 19–32)
Calcium: 9.4 mg/dL (ref 8.4–10.5)
GFR calc non Af Amer: >60 mL/min (ref >60)
Glucose, Bld: 115 mg/dL — ABNORMAL HIGH (ref 70–99)
Potassium: 3.9 mEq/L (ref 3.5–5.1)
Sodium: 139 mEq/L (ref 135–145)

## 2011-03-25 LAB — SURGICAL PCR SCREEN
MRSA, PCR: NEGATIVE
Staphylococcus aureus: POSITIVE — AB

## 2011-03-27 LAB — CBC
Platelets: 217
RBC: 4.34
WBC: 8.8

## 2011-03-27 LAB — BASIC METABOLIC PANEL
BUN: 19
Creatinine, Ser: 1.04
GFR calc Af Amer: >60
GFR calc non Af Amer: >60
Potassium: 4.3

## 2011-03-27 LAB — APTT: aPTT: 28

## 2011-03-27 LAB — PROTIME-INR: Prothrombin Time: 14.5

## 2011-03-31 ENCOUNTER — Inpatient Hospital Stay (HOSPITAL_COMMUNITY)
Admission: RE | Admit: 2011-03-31 | Discharge: 2011-04-01 | DRG: 989 | Disposition: A | Discharge status: Home / Self Care | Payer: Medicare Other | Source: Ambulatory Visit | Attending: Surgery | Admitting: Surgery

## 2011-03-31 DIAGNOSIS — K9509 Other complications of gastric band procedure: Secondary | ICD-10-CM

## 2011-03-31 DIAGNOSIS — Z01812 Encounter for preprocedural laboratory examination: Secondary | ICD-10-CM

## 2011-03-31 DIAGNOSIS — Y831 Surgical operation with implant of artificial internal device as the cause of abnormal reaction of the patient, or of later complication, without mention of misadventure at the time of the procedure: Secondary | ICD-10-CM | POA: Diagnosis present

## 2011-03-31 DIAGNOSIS — I10 Essential (primary) hypertension: Secondary | ICD-10-CM | POA: Diagnosis present

## 2011-04-01 ENCOUNTER — Inpatient Hospital Stay (HOSPITAL_COMMUNITY): Payer: Medicare Other

## 2011-04-01 LAB — CBC
Hemoglobin: 12.9 g/dL — ABNORMAL LOW (ref 13.0–17.0)
Platelets: 153 10*3/uL (ref 150–400)
RBC: 4.16 MIL/uL — ABNORMAL LOW (ref 4.22–5.81)
WBC: 7.6 10*3/uL (ref 4.0–10.5)

## 2011-04-01 LAB — DIFFERENTIAL
Basophils Absolute: 0 10*3/uL (ref 0.0–0.1)
Basophils Relative: 1 % (ref 0–1)
Eosinophils Absolute: 0.2 10*3/uL (ref 0.0–0.7)
Neutro Abs: 5 10*3/uL (ref 1.7–7.7)
Neutrophils Relative %: 66 % (ref 43–77)

## 2011-04-01 MED ORDER — IOHEXOL 300 MG/ML  SOLN
20.0000 mL | Freq: Once | INTRAMUSCULAR | Status: AC | PRN
  Administered 2011-04-01: 20 mL via ORAL

## 2011-04-01 NOTE — Op Note (Signed)
NAME:  [name], [name]                 ACCOUNT NO.:  [id number]  MEDICAL RECORD NO.:  [id number]  LOCATION:  1526                         FACILITY:  [facility]  PHYSICIAN:  Thornton Park. [name] Deutscher, MD  DATE OF BIRTH:  Feb 01, 1946  DATE OF PROCEDURE: DATE OF DISCHARGE:                              OPERATIVE REPORT   PREOPERATIVE DIAGNOSIS:  Laparoscopic adjustable gastric band with previous anterior slip with previous unbuckling of band in June 2012.  PROCEDURE:  Laparoscopic re-siding of lap band and replication.  DESCRIPTION OF PROCEDURE:  The patient was taken to room 11 on Tuesday, March 31, 2011.  The abdomen was prepped with PCMX and draped sterilely.  I entered the abdomen through the left upper quadrant using 0 degree 5 mm Optiview and placed a second 5 mm on the left side going a bit higher.  To the right above his band port, I placed a 12 and then lateral to that another 5.  Nathanson retractor was placed in the upper midline and the liver was retracted.  The band was obviously unbuckled when we got there and there were no other unusual features to things at this point.  We used scissors to take down the plication over the remnant of the band and they dissected more freely.  We then cut it up near its entrance point toward the port at the junction point and at that point we then unbuckled the band and I pulled it on through.  Band passer was then passed through the same entrance point, but I created a new spot up, higher up, on the left crus and did some dissection.  I had to kind of actually do a retrograde dissection from lateral up to free that up.  I felt this was scar and we seemed to stay well out of the stomach.  I then passed the band passer through this port through this hole from the lower port on the right and then we put a tip on the band tubing and went ahead and reengaged and brought it around to its new home slightly higher.  It was then buckled and Dr. Johna Sheriff  held it down.  We buckled it over sizing tubing, which we passed.  We then plicated first placing an S stitch on the very far corner and tacking it to the left crus and then it was plicated stomach to stomach down for 3 more sutures and then a 5th U stitch was made to grab along the lesser curvature plicated portion of the band and then up over to the proximal pouch.  It nicely secure the band.  Tubing was then brought up along with the other end of the tubing from the port where we were able to splice those by rejoining them outside of the wound and dropping back in without having to do anything to the previous port.  The patient tolerated the procedure well.  The ports were injected with some Exparel.  We closed 4-0 Vicryl and some 4-0 Monocryl, Benzoin, Steri-Strips.  The patient was taken to recovery room in satisfactory condition.     Thornton Park [name] Deutscher, MD    MBM/MEDQ  D:  03/31/2011  T:  04/01/2011  Job:  [id number]  Electronically Signed by Luretha Murphy MD on 04/01/2011 07:34:27 AM

## 2011-04-03 ENCOUNTER — Telehealth (INDEPENDENT_AMBULATORY_CARE_PROVIDER_SITE_OTHER): Payer: Self-pay

## 2011-04-03 NOTE — Telephone Encounter (Signed)
Pt's wife called to report his BP is still elevated at 162/94.  He had problems during surgery w/ high BP.  I recommended she call their PCP (Dr. Juleen China), to follow up - pt  May need to be seen.

## 2011-04-17 ENCOUNTER — Encounter (INDEPENDENT_AMBULATORY_CARE_PROVIDER_SITE_OTHER): Payer: Medicare Other | Admitting: Surgery

## 2011-04-17 ENCOUNTER — Encounter (INDEPENDENT_AMBULATORY_CARE_PROVIDER_SITE_OTHER): Payer: Self-pay | Admitting: Surgery

## 2011-04-17 ENCOUNTER — Ambulatory Visit (INDEPENDENT_AMBULATORY_CARE_PROVIDER_SITE_OTHER): Payer: Medicare Other | Admitting: Surgery

## 2011-04-17 VITALS — BP 144/77 | HR 72 | Temp 97.8°F | Resp 14 | Ht 72.0 in | Wt 273.2 lb

## 2011-04-17 DIAGNOSIS — Z9884 Bariatric surgery status: Secondary | ICD-10-CM

## 2011-04-17 NOTE — Progress Notes (Signed)
[name] [name] had a resiting of his APL band. This was done on March 31, 2011. It involves completely on buckling and removing the vein and and then passing it through a different tract was higher. I also employed a super stitch. This was popularized by a Primary school teacher and he involved suturing the fundus to the left crus. He had multiple extra plication sutures numbering about 5.  He is doing very well today and his incisions have healed nicely. He is going pleasant timing at the end of October. When he returns in November will be about the time to do a first refill on him. We'll see him at that time for instillation of fluid to his lap band.

## 2011-05-14 ENCOUNTER — Encounter (INDEPENDENT_AMBULATORY_CARE_PROVIDER_SITE_OTHER): Payer: Self-pay | Admitting: Surgery

## 2011-05-15 ENCOUNTER — Ambulatory Visit (INDEPENDENT_AMBULATORY_CARE_PROVIDER_SITE_OTHER): Payer: Medicare Other | Admitting: Physician Assistant

## 2011-05-15 ENCOUNTER — Encounter (INDEPENDENT_AMBULATORY_CARE_PROVIDER_SITE_OTHER): Payer: Medicare Other

## 2011-05-15 ENCOUNTER — Encounter (INDEPENDENT_AMBULATORY_CARE_PROVIDER_SITE_OTHER): Payer: Self-pay

## 2011-05-15 VITALS — BP 148/84 | HR 64 | Temp 97.8°F | Resp 20 | Ht 72.0 in | Wt 286.2 lb

## 2011-05-15 DIAGNOSIS — Z4651 Encounter for fitting and adjustment of gastric lap band: Secondary | ICD-10-CM

## 2011-05-15 NOTE — Patient Instructions (Signed)
Take clear liquids for the next 48 hours. Thin protein shakes are ok to start on Saturday evening. Call us if you have persistent vomiting or regurgitation, night cough or reflux symptoms. Return as scheduled or sooner if you notice no changes in hunger/portion sizes.   

## 2011-05-15 NOTE — Progress Notes (Signed)
  HISTORY: [name] [name] is a 65 y.o.male who received an AP-Large lap-band in June 2010 by Dr. Daphine Deutscher with two revisions since, the latest being two months ago. He has not had an adjustment since his surgery and says he feels no restriction whatsoever. He's ready to get back on track with his weight loss.  VITAL SIGNS: Filed Vitals:   05/15/11 1344  BP: 148/84  Pulse: 64  Temp: 97.8 F (36.6 C)  Resp: 20    PHYSICAL EXAM: Physical exam reveals a very well-appearing 65 y.o.male in no apparent distress Neurologic: Awake, alert, oriented Psych: Bright affect, conversant Respiratory: Breathing even and unlabored. No stridor or wheezing Abdomen: Soft, nontender, nondistended to palpation. Incisions well-healed. No incisional hernias. Port easily palpated. Extremities: Atraumatic, good range of motion.  ASSESMENT: 65 y.o.  male  s/p AP-Large lap-band with revision x 2 for slip.   PLAN: The patient's port was accessed with a 20G Huber needle without difficulty. Clear fluid was aspirated and 2 mL saline was added to the port to give a total predicted volume of 5 mL. The patient was able to swallow water without difficulty following the procedure and was instructed to take clear liquids for the next 24-48 hours and advance slowly as tolerated.

## 2011-06-17 ENCOUNTER — Ambulatory Visit (INDEPENDENT_AMBULATORY_CARE_PROVIDER_SITE_OTHER): Payer: Medicare Other | Admitting: Surgery

## 2011-06-17 ENCOUNTER — Encounter (INDEPENDENT_AMBULATORY_CARE_PROVIDER_SITE_OTHER): Payer: Self-pay | Admitting: Surgery

## 2011-06-17 VITALS — BP 146/80 | HR 68 | Temp 97.5°F | Resp 18 | Ht 72.0 in | Wt 292.1 lb

## 2011-06-17 DIAGNOSIS — K602 Anal fissure, unspecified: Secondary | ICD-10-CM

## 2011-06-17 DIAGNOSIS — K921 Melena: Secondary | ICD-10-CM

## 2011-06-17 DIAGNOSIS — Z9884 Bariatric surgery status: Secondary | ICD-10-CM

## 2011-06-17 NOTE — Patient Instructions (Signed)
Will need to schedule colonscopy

## 2011-06-17 NOTE — Progress Notes (Signed)
Last Friday [name] [name] had hematochezia on numerous occasions on the weekend. He actually looks a little bit pale today. He didn't call anyone until Sunday as he thought this was his hemorrhoids. They're rectal exam on him and as high as I could feel a could not feel any rectal masses. He has a posterior fissure. He did we did do a revision of his lapband and theoretically could have had an upper GI bleed from where his band had slipped although that was sometime ago. More  important to rule out colonic bleed from a neoplasm. He had his last colonoscopy March 2007 by Dr. Sabino Gasser.  I must see if I can get up with Dr. Matthias Hughs about one of him either him or his partners scoping [name].

## 2011-06-19 ENCOUNTER — Encounter (INDEPENDENT_AMBULATORY_CARE_PROVIDER_SITE_OTHER): Payer: Medicare Other

## 2011-06-22 ENCOUNTER — Other Ambulatory Visit (INDEPENDENT_AMBULATORY_CARE_PROVIDER_SITE_OTHER): Payer: Self-pay | Admitting: General Surgery

## 2011-06-22 DIAGNOSIS — K921 Melena: Secondary | ICD-10-CM

## 2011-06-22 DIAGNOSIS — K602 Anal fissure, unspecified: Secondary | ICD-10-CM

## 2011-06-24 ENCOUNTER — Encounter (INDEPENDENT_AMBULATORY_CARE_PROVIDER_SITE_OTHER): Payer: Self-pay | Admitting: Surgery

## 2011-06-26 ENCOUNTER — Encounter (INDEPENDENT_AMBULATORY_CARE_PROVIDER_SITE_OTHER): Payer: Medicare Other

## 2011-07-16 ENCOUNTER — Other Ambulatory Visit: Payer: Self-pay | Admitting: Gastroenterology

## 2011-07-16 DIAGNOSIS — D126 Benign neoplasm of colon, unspecified: Secondary | ICD-10-CM | POA: Diagnosis not present

## 2011-07-17 DIAGNOSIS — K5730 Diverticulosis of large intestine without perforation or abscess without bleeding: Secondary | ICD-10-CM | POA: Diagnosis not present

## 2011-07-17 DIAGNOSIS — K648 Other hemorrhoids: Secondary | ICD-10-CM | POA: Diagnosis not present

## 2011-07-17 DIAGNOSIS — K921 Melena: Secondary | ICD-10-CM | POA: Diagnosis not present

## 2011-07-17 DIAGNOSIS — K644 Residual hemorrhoidal skin tags: Secondary | ICD-10-CM | POA: Diagnosis not present

## 2011-07-17 DIAGNOSIS — D126 Benign neoplasm of colon, unspecified: Secondary | ICD-10-CM | POA: Diagnosis not present

## 2011-07-17 HISTORY — PX: COLONOSCOPY: SHX174

## 2011-07-18 ENCOUNTER — Encounter (HOSPITAL_COMMUNITY): Payer: Self-pay | Admitting: Anesthesiology

## 2011-07-18 ENCOUNTER — Encounter (HOSPITAL_COMMUNITY): Payer: Self-pay | Admitting: *Deleted

## 2011-07-18 ENCOUNTER — Emergency Department (HOSPITAL_COMMUNITY): Payer: Medicare Other | Admitting: Anesthesiology

## 2011-07-18 ENCOUNTER — Emergency Department (HOSPITAL_COMMUNITY): Payer: Medicare Other

## 2011-07-18 ENCOUNTER — Inpatient Hospital Stay (HOSPITAL_COMMUNITY)
Admission: EM | Admit: 2011-07-18 | Discharge: 2011-07-21 | DRG: 690 | Disposition: A | Discharge status: Home / Self Care | Payer: Medicare Other | Attending: Urology | Admitting: Urology

## 2011-07-18 ENCOUNTER — Encounter (HOSPITAL_COMMUNITY)
Admission: EM | Disposition: A | Discharge status: Home / Self Care | Payer: Self-pay | Source: Home / Self Care | Attending: Urology

## 2011-07-18 ENCOUNTER — Other Ambulatory Visit: Payer: Self-pay

## 2011-07-18 DIAGNOSIS — N201 Calculus of ureter: Secondary | ICD-10-CM | POA: Diagnosis present

## 2011-07-18 DIAGNOSIS — N12 Tubulo-interstitial nephritis, not specified as acute or chronic: Secondary | ICD-10-CM | POA: Diagnosis not present

## 2011-07-18 DIAGNOSIS — R109 Unspecified abdominal pain: Secondary | ICD-10-CM | POA: Diagnosis not present

## 2011-07-18 DIAGNOSIS — I517 Cardiomegaly: Secondary | ICD-10-CM | POA: Diagnosis not present

## 2011-07-18 DIAGNOSIS — I10 Essential (primary) hypertension: Secondary | ICD-10-CM | POA: Diagnosis present

## 2011-07-18 DIAGNOSIS — A498 Other bacterial infections of unspecified site: Secondary | ICD-10-CM | POA: Diagnosis not present

## 2011-07-18 DIAGNOSIS — R1032 Left lower quadrant pain: Secondary | ICD-10-CM | POA: Diagnosis not present

## 2011-07-18 DIAGNOSIS — N390 Urinary tract infection, site not specified: Secondary | ICD-10-CM | POA: Diagnosis not present

## 2011-07-18 DIAGNOSIS — R1084 Generalized abdominal pain: Secondary | ICD-10-CM | POA: Diagnosis not present

## 2011-07-18 DIAGNOSIS — N1330 Unspecified hydronephrosis: Secondary | ICD-10-CM | POA: Diagnosis not present

## 2011-07-18 DIAGNOSIS — R110 Nausea: Secondary | ICD-10-CM | POA: Diagnosis not present

## 2011-07-18 HISTORY — PX: CYSTOSCOPY W/ URETERAL STENT PLACEMENT: SHX1429

## 2011-07-18 LAB — URINE CULTURE
Colony Count: >=100000
Culture  Setup Time: 201301130331

## 2011-07-18 LAB — DIFFERENTIAL
Basophils Absolute: 0 10*3/uL (ref 0.0–0.1)
Lymphocytes Relative: 3 % — ABNORMAL LOW (ref 12–46)
Lymphs Abs: 0.5 10*3/uL — ABNORMAL LOW (ref 0.7–4.0)
Monocytes Absolute: 0.2 10*3/uL (ref 0.1–1.0)
Neutro Abs: 14.1 10*3/uL — ABNORMAL HIGH (ref 1.7–7.7)

## 2011-07-18 LAB — URINALYSIS, ROUTINE W REFLEX MICROSCOPIC
Ketones, ur: NEGATIVE mg/dL
Leukocytes, UA: NEGATIVE
Nitrite: POSITIVE — AB
Protein, ur: 30 mg/dL — AB
Urobilinogen, UA: 0.2 mg/dL (ref 0.0–1.0)

## 2011-07-18 LAB — BASIC METABOLIC PANEL
Calcium: 9.1 mg/dL (ref 8.4–10.5)
Chloride: 101 mEq/L (ref 96–112)
Creatinine, Ser: 0.99 mg/dL (ref 0.50–1.35)
GFR calc Af Amer: >90 mL/min (ref >90)
GFR calc non Af Amer: 83 mL/min — ABNORMAL LOW (ref >90)

## 2011-07-18 LAB — CBC
HCT: 39.2 % (ref 39.0–52.0)
Hemoglobin: 13.3 g/dL (ref 13.0–17.0)
RBC: 4.25 MIL/uL (ref 4.22–5.81)
RDW: 12.5 % (ref 11.5–15.5)
WBC: 14.8 10*3/uL — ABNORMAL HIGH (ref 4.0–10.5)

## 2011-07-18 SURGERY — CYSTOSCOPY, FLEXIBLE, WITH STENT REPLACEMENT
Anesthesia: General | Site: Ureter | Laterality: Left | Wound class: Clean Contaminated

## 2011-07-18 MED ORDER — IOHEXOL 300 MG/ML  SOLN
INTRAMUSCULAR | Status: AC
  Filled 2011-07-18: qty 1

## 2011-07-18 MED ORDER — AMLODIPINE BESYLATE 5 MG PO TABS
5.0000 mg | ORAL_TABLET | Freq: Every day | ORAL | Status: DC
Start: 2011-07-19 — End: 2011-07-21
  Administered 2011-07-19 – 2011-07-20 (×2): 5 mg via ORAL
  Filled 2011-07-18 (×3): qty 1

## 2011-07-18 MED ORDER — PROMETHAZINE HCL 25 MG/ML IJ SOLN: 6.2500 mg | INTRAMUSCULAR | Status: DC | PRN

## 2011-07-18 MED ORDER — CISATRACURIUM BESYLATE 2 MG/ML IV SOLN
INTRAVENOUS | Status: DC | PRN
  Administered 2011-07-18: 10 mg via INTRAVENOUS

## 2011-07-18 MED ORDER — ALFUZOSIN HCL ER 10 MG PO TB24
10.0000 mg | ORAL_TABLET | Freq: Every day | ORAL | Status: DC
  Administered 2011-07-18 – 2011-07-20 (×3): 10 mg via ORAL
  Filled 2011-07-18 (×4): qty 1

## 2011-07-18 MED ORDER — ONDANSETRON HCL 4 MG/2ML IJ SOLN
4.0000 mg | Freq: Once | INTRAMUSCULAR | Status: AC
  Administered 2011-07-18: 4 mg via INTRAVENOUS
  Filled 2011-07-18: qty 2

## 2011-07-18 MED ORDER — SENNOSIDES-DOCUSATE SODIUM 8.6-50 MG PO TABS
2.0000 | ORAL_TABLET | Freq: Two times a day (BID) | ORAL | Status: DC
  Administered 2011-07-18 – 2011-07-20 (×5): 2 via ORAL
  Filled 2011-07-18 (×7): qty 2

## 2011-07-18 MED ORDER — HYDROMORPHONE HCL PF 1 MG/ML IJ SOLN
1.0000 mg | Freq: Once | INTRAMUSCULAR | Status: AC
  Administered 2011-07-18: 1 mg via INTRAVENOUS
  Filled 2011-07-18: qty 1

## 2011-07-18 MED ORDER — HYDROMORPHONE HCL PF 1 MG/ML IJ SOLN
0.5000 mg | INTRAMUSCULAR | Status: DC | PRN
  Administered 2011-07-18 – 2011-07-19 (×4): 1 mg via INTRAVENOUS
  Filled 2011-07-18 (×4): qty 1

## 2011-07-18 MED ORDER — TAMSULOSIN HCL 0.4 MG PO CAPS: 0.4000 mg | ORAL_CAPSULE | Freq: Every day | ORAL | Status: DC

## 2011-07-18 MED ORDER — DEXTROSE 5 % IV SOLN
1.0000 g | INTRAVENOUS | Status: DC
  Administered 2011-07-19 – 2011-07-20 (×2): 1 g via INTRAVENOUS
  Filled 2011-07-18 (×3): qty 10

## 2011-07-18 MED ORDER — FENTANYL CITRATE 0.05 MG/ML IJ SOLN
INTRAMUSCULAR | Status: DC | PRN
  Administered 2011-07-18: 100 ug via INTRAVENOUS

## 2011-07-18 MED ORDER — OLMESARTAN MEDOXOMIL 20 MG PO TABS
20.0000 mg | ORAL_TABLET | Freq: Every day | ORAL | Status: DC
  Administered 2011-07-18: 20 mg via ORAL
  Filled 2011-07-18 (×2): qty 1

## 2011-07-18 MED ORDER — DEXTROSE 5 % IV SOLN
1.0000 g | Freq: Once | INTRAVENOUS | Status: AC
  Administered 2011-07-18: 1 g via INTRAVENOUS
  Filled 2011-07-18: qty 10

## 2011-07-18 MED ORDER — LIDOCAINE HCL (CARDIAC) 20 MG/ML IV SOLN
INTRAVENOUS | Status: DC | PRN
  Administered 2011-07-18: 40 mg via INTRAVENOUS

## 2011-07-18 MED ORDER — GLYCOPYRROLATE 0.2 MG/ML IJ SOLN
INTRAMUSCULAR | Status: DC | PRN
  Administered 2011-07-18: .6 mg via INTRAVENOUS

## 2011-07-18 MED ORDER — ONDANSETRON HCL 4 MG/2ML IJ SOLN
4.0000 mg | INTRAMUSCULAR | Status: DC | PRN
  Administered 2011-07-19: 4 mg via INTRAVENOUS
  Filled 2011-07-18: qty 2

## 2011-07-18 MED ORDER — MIDAZOLAM HCL 5 MG/5ML IJ SOLN
INTRAMUSCULAR | Status: DC | PRN
  Administered 2011-07-18: 2 mg via INTRAVENOUS

## 2011-07-18 MED ORDER — FENTANYL CITRATE 0.05 MG/ML IJ SOLN: 25.0000 ug | INTRAMUSCULAR | Status: DC | PRN

## 2011-07-18 MED ORDER — FUROSEMIDE 40 MG PO TABS
40.0000 mg | ORAL_TABLET | Freq: Every day | ORAL | Status: DC
  Administered 2011-07-19 – 2011-07-20 (×2): 40 mg via ORAL
  Filled 2011-07-18 (×3): qty 1

## 2011-07-18 MED ORDER — ONDANSETRON HCL 4 MG/2ML IJ SOLN
INTRAMUSCULAR | Status: DC | PRN
  Administered 2011-07-18 (×2): 2 mg via INTRAVENOUS

## 2011-07-18 MED ORDER — OXYCODONE-ACETAMINOPHEN 5-325 MG PO TABS
1.0000 | ORAL_TABLET | ORAL | Status: DC | PRN
  Administered 2011-07-18 – 2011-07-21 (×9): 2 via ORAL
  Filled 2011-07-18 (×9): qty 2

## 2011-07-18 MED ORDER — LACTATED RINGERS IV SOLN
INTRAVENOUS | Status: DC | PRN
  Administered 2011-07-18: 16:00:00 via INTRAVENOUS

## 2011-07-18 MED ORDER — PROPOFOL 10 MG/ML IV EMUL
INTRAVENOUS | Status: DC | PRN
  Administered 2011-07-18: 200 mg via INTRAVENOUS

## 2011-07-18 MED ORDER — OXYCODONE-ACETAMINOPHEN 5-325 MG PO TABS: 1.0000 | ORAL_TABLET | ORAL | Status: AC | PRN

## 2011-07-18 MED ORDER — LIDOCAINE HCL 2 % EX GEL
CUTANEOUS | Status: AC
  Filled 2011-07-18: qty 10

## 2011-07-18 MED ORDER — ACETAMINOPHEN 650 MG RE SUPP
975.0000 mg | Freq: Once | RECTAL | Status: AC
  Administered 2011-07-18: 975 mg via RECTAL
  Filled 2011-07-18: qty 1

## 2011-07-18 MED ORDER — SUCCINYLCHOLINE CHLORIDE 20 MG/ML IJ SOLN
INTRAMUSCULAR | Status: DC | PRN
  Administered 2011-07-18: 180 mg via INTRAVENOUS

## 2011-07-18 MED ORDER — ACETAMINOPHEN 325 MG PO TABS
650.0000 mg | ORAL_TABLET | ORAL | Status: DC | PRN
  Administered 2011-07-19: 650 mg via ORAL
  Filled 2011-07-18: qty 2

## 2011-07-18 MED ORDER — SODIUM CHLORIDE 0.9 % IJ SOLN
INTRAMUSCULAR | Status: DC | PRN
  Administered 2011-07-18: 17:00:00

## 2011-07-18 MED ORDER — KCL IN DEXTROSE-NACL 10-5-0.45 MEQ/L-%-% IV SOLN
INTRAVENOUS | Status: DC
  Administered 2011-07-18 – 2011-07-21 (×6): via INTRAVENOUS
  Filled 2011-07-18 (×12): qty 1000

## 2011-07-18 MED ORDER — NEOSTIGMINE METHYLSULFATE 1 MG/ML IJ SOLN
INTRAMUSCULAR | Status: DC | PRN
  Administered 2011-07-18: 5 mg via INTRAVENOUS

## 2011-07-18 MED ORDER — STERILE WATER FOR IRRIGATION IR SOLN
Status: DC | PRN
  Administered 2011-07-18: 3000 mL

## 2011-07-18 MED ORDER — IOHEXOL 300 MG/ML  SOLN
100.0000 mL | Freq: Once | INTRAMUSCULAR | Status: AC | PRN
  Administered 2011-07-18: 100 mL via INTRAVENOUS

## 2011-07-18 MED ORDER — CEPHALEXIN 500 MG PO CAPS
500.0000 mg | ORAL_CAPSULE | Freq: Once | ORAL | Status: AC
  Administered 2011-07-18: 500 mg via ORAL
  Filled 2011-07-18: qty 1

## 2011-07-18 MED ORDER — CEPHALEXIN 500 MG PO CAPS: 500.0000 mg | ORAL_CAPSULE | Freq: Four times a day (QID) | ORAL | Status: AC

## 2011-07-18 SURGICAL SUPPLY — 10 items
BAG URO CATCHER STRL LF (DRAPE) ×1 IMPLANT
CATH INTERMIT  6FR 70CM (CATHETERS) ×1 IMPLANT
CLOTH BEACON ORANGE TIMEOUT ST (SAFETY) ×1 IMPLANT
GLOVE BIOGEL M STRL SZ7.5 (GLOVE) ×1 IMPLANT
GOWN STRL NON-REIN LRG LVL3 (GOWN DISPOSABLE) ×1 IMPLANT
GUIDEWIRE STR DUAL SENSOR (WIRE) ×1 IMPLANT
MANIFOLD NEPTUNE II (INSTRUMENTS) ×1 IMPLANT
PACK CYSTO (CUSTOM PROCEDURE TRAY) ×1 IMPLANT
STENT CONTOUR URETERAL (STENTS) ×1 IMPLANT
TUBING CONNECTING 10 (TUBING) ×1 IMPLANT

## 2011-07-18 NOTE — Brief Op Note (Signed)
07/18/2011  5:37 PM  PATIENT:  [name] [name]  66 y.o. male  PRE-OPERATIVE DIAGNOSIS:  calculus left ureter  POST-OPERATIVE DIAGNOSIS:  calculus left ureter  PROCEDURE:  Procedure(s): CYSTOSCOPY WITH STENT REPLACEMENT  SURGEON:  Surgeon(s): Sebastian Ache   ANESTHESIA:   general  EBL:   nil  BLOOD ADMINISTERED:none  DRAINS: none   LOCAL MEDICATIONS USED:  NONE  SPECIMEN:  No Specimen  DISPOSITION OF SPECIMEN:  N/A  COUNTS:  YES  TOURNIQUET:  * No tourniquets in log *  DICTATION: .Other Dictation: Dictation Number [id number]  PLAN OF CARE: Admit to inpatient   PATIENT DISPOSITION:  PACU - hemodynamically stable.   Delay start of Pharmacological VTE agent (>24hrs) due to surgical blood loss or risk of bleeding:  {YES/NO/NOT APPLICABLE:20182

## 2011-07-18 NOTE — H&P (Signed)
[name] [name] is an 66 y.o. male.   Chief Complaint: Left Flank / Groin Pain HPI:  66 yo WM with 18 hours of left abdominal and flank pain, fevers, chills, malaise and CT evidence of obstructing 6mm left ureteral stone. Also with leukocytosis and urine studies c/w infection. Pain is 7/10, better with pain meds, colicky and associated with nausea. Had colonosopy yesterday w/o complications. No prior stones or GU infections.    PMH significant for mild LUTS, obesity s/p lap band and revision. No other surgeries. No h/o CV disease.  Past Medical History  Diagnosis Date  . Hypertension   . Difficulty urinating   . Abdominal pain   . GERD (gastroesophageal reflux disease)     Past Surgical History  Procedure Date  . Gastric restriction surgery 12/11/2008    lap band  . Laparoscopic gastric banding 12/11/2008    Family History  Problem Relation Age of Onset  . Cancer Mother     ovarian  . Other Father     MVA   Social History:  reports that he has never smoked. He has never used smokeless tobacco. He reports that he does not drink alcohol or use illicit drugs.  Allergies: No Known Allergies  Medications Prior to Admission  Medication Dose Route Frequency Provider Last Rate Last Dose  . acetaminophen (TYLENOL) suppository 975 mg  975 mg Rectal Once Joya Gaskins, MD   975 mg at 07/18/11 1608  . cefTRIAXone (ROCEPHIN) 1 g in dextrose 5 % 50 mL IVPB  1 g Intravenous Once Joya Gaskins, MD      . cephALEXin [facility]) capsule 500 mg  500 mg Oral Once Joya Gaskins, MD   500 mg at 07/18/11 1505  . HYDROmorphone (DILAUDID) injection 1 mg  1 mg Intravenous Once Joya Gaskins, MD   1 mg at 07/18/11 1015  . HYDROmorphone (DILAUDID) injection 1 mg  1 mg Intravenous Once Joya Gaskins, MD   1 mg at 07/18/11 1203  . iohexol (OMNIPAQUE) 300 MG/ML solution 100 mL  100 mL Intravenous Once PRN Torah Pinnock   100 mL at 07/18/11 1302  . ondansetron (ZOFRAN) injection 4 mg  4 mg  Intravenous Once Joya Gaskins, MD   4 mg at 07/18/11 1014  . ondansetron (ZOFRAN) injection 4 mg  4 mg Intravenous Once Sebastian Ache   4 mg at 07/18/11 1204   Medications Prior to Admission  Medication Sig Dispense Refill  . alfuzosin (UROXATRAL) 10 MG 24 hr tablet Take 10 mg by mouth daily.        Marland Kitchen amLODipine (NORVASC) 10 MG tablet       . Calcium Carb-Ergocalciferol (CHEWABLE CALCIUM/D PO) Take by mouth.        . furosemide (LASIX) 40 MG tablet       . Pediatric Multiple Vitamins (CHEWABLE MULTIPLE VITAMINS PO) Take by mouth.        . Telmisartan (MICARDIS PO) Take by mouth.          Results for orders placed during the hospital encounter of 07/18/11 (from the past 48 hour(s))  CBC     Status: Abnormal   Collection Time   07/18/11 10:06 AM      Component Value Range Comment   WBC 14.8 (*) 4.0 - 10.5 (K/uL)    RBC 4.25  4.22 - 5.81 (MIL/uL)    Hemoglobin 13.3  13.0 - 17.0 (g/dL)    HCT 78.2  95.6 - 21.3 (%)  MCV 92.2  78.0 - 100.0 (fL)    MCH 31.3  26.0 - 34.0 (pg)    MCHC 33.9  30.0 - 36.0 (g/dL)    RDW 16.1  09.6 - 04.5 (%)    Platelets 190  150 - 400 (K/uL)   DIFFERENTIAL     Status: Abnormal   Collection Time   07/18/11 10:06 AM      Component Value Range Comment   Neutrophils Relative 96 (*) 43 - 77 (%)    Neutro Abs 14.1 (*) 1.7 - 7.7 (K/uL)    Lymphocytes Relative 3 (*) 12 - 46 (%)    Lymphs Abs 0.5 (*) 0.7 - 4.0 (K/uL)    Monocytes Relative 1 (*) 3 - 12 (%)    Monocytes Absolute 0.2  0.1 - 1.0 (K/uL)    Eosinophils Relative 0  0 - 5 (%)    Eosinophils Absolute 0.0  0.0 - 0.7 (K/uL)    Basophils Relative 0  0 - 1 (%)    Basophils Absolute 0.0  0.0 - 0.1 (K/uL)   BASIC METABOLIC PANEL     Status: Abnormal   Collection Time   07/18/11 10:06 AM      Component Value Range Comment   Sodium 138  135 - 145 (mEq/L)    Potassium 3.7  3.5 - 5.1 (mEq/L)    Chloride 101  96 - 112 (mEq/L)    CO2 25  19 - 32 (mEq/L)    Glucose, Bld 155 (*) 70 - 99 (mg/dL)    BUN 13   6 - 23 (mg/dL)    Creatinine, Ser 4.09  0.50 - 1.35 (mg/dL)    Calcium 9.1  8.4 - 10.5 (mg/dL)    GFR calc non Af Amer 83 (*) >90 (mL/min)    GFR calc Af Amer >90  >90 (mL/min)   URINALYSIS, ROUTINE W REFLEX MICROSCOPIC     Status: Abnormal   Collection Time   07/18/11  1:43 PM      Component Value Range Comment   Color, Urine YELLOW  YELLOW     APPearance CLEAR  CLEAR     Specific Gravity, Urine 1.017  1.005 - 1.030     pH 5.5  5.0 - 8.0     Glucose, UA NEGATIVE  NEGATIVE (mg/dL)    Hgb urine dipstick TRACE (*) NEGATIVE     Bilirubin Urine NEGATIVE  NEGATIVE     Ketones, ur NEGATIVE  NEGATIVE (mg/dL)    Protein, ur 30 (*) NEGATIVE (mg/dL)    Urobilinogen, UA 0.2  0.0 - 1.0 (mg/dL)    Nitrite POSITIVE (*) NEGATIVE     Leukocytes, UA NEGATIVE  NEGATIVE    URINE MICROSCOPIC-ADD ON     Status: Abnormal   Collection Time   07/18/11  1:43 PM      Component Value Range Comment   Squamous Epithelial / LPF RARE  RARE     WBC, UA 3-6  <3 (WBC/hpf)    RBC / HPF 0-2  <3 (RBC/hpf)    Bacteria, UA MANY (*) RARE     Ct Abdomen Pelvis W Contrast  07/18/2011  *RADIOLOGY REPORT*  Clinical Data: Abdominal pain, status post colonoscopy.  Nausea. Colonoscopy yesterday.  Elevated blood pressure.  CT ABDOMEN AND PELVIS WITH CONTRAST  Technique:  Multidetector CT imaging of the abdomen and pelvis was performed following the standard protocol during bolus administration of intravenous contrast.  Contrast: OMNIPAQUE IOHEXOL 300 MG/ML IV SOLN  Comparison: 04/01/2011  upper GI.  No prior CT.  Findings:  A subpleural 4 mm left lower lobe lung nodule on image 13.  Mild cardiomegaly without pericardial or pleural effusion.  Suspect mild hepatic steatosis.  No focal liver lesion.  Normal spleen.  Status post lap band.  Normal distal stomach, pancreas, gallbladder, biliary tract, adrenal glands, right kidney. Moderate left-sided urinary tract obstruction secondary to a 6 mm proximal left ureteric calculus on  transverse image 49 and coronal image 38.  No distal hydroureter or ureteric stone. Delayed images demonstrate delayed excretion of contrast by the obstructed left kidney.  No retroperitoneal or retrocrural adenopathy.  Scattered colonic diverticula.  Normal terminal ileum and appendix. Mild increased density in the ileocolic mesenteric fat on image 57. Small bowel otherwise normal. No ascites.  No pelvic adenopathy. Normal urinary bladder.  Mild prostatomegaly. No significant free fluid.  Degenerative disc disease at the lumbosacral junction. Transitional S1 vertebral body.  IMPRESSION:  1.  6 mm proximal left ureteric obstructive calculus. 2.  Status post lap band, without acute complication identified. 3.  Subtle ileocolic mesenteric findings which could represent mesenteric panniculitis. 4.  4 mm left lower lobe lung nodule. If the patient is at high risk for bronchogenic carcinoma, follow-up chest CT at 1 year is recommended.  If the patient is at low risk, no follow-up is needed.   This recommendation follows the consensus statement: "Guidelines for Management of Small Pulmonary Nodules Detected on CT Scans:  A Statement from the Fleischner Society" as published in Radiology 2005; 237:395-400.  Available online at: DietDisorder.cz.  Original Report Authenticated By: Consuello Bossier, M.D.   Dg Chest Port 1 View  07/18/2011  *RADIOLOGY REPORT*  Clinical Data: Left lower quadrant abdominal pain following colonoscopy yesterday.  Nausea.  History of smoking, lap band.  PORTABLE CHEST - 1 VIEW  Comparison: 06/17/2010  Findings: Heart is enlarged.  There is interstitial edema.  No overt edema.  No focal consolidations.  No evidence for free intraperitoneal air. Lap band is partially imaged.  IMPRESSION: Cardiomegaly and interstitial edema.  Original Report Authenticated By: Patterson Hammersmith, M.D.    Review of Systems  Constitutional: Positive for fever, chills and  malaise/fatigue.  HENT: Negative.   Eyes: Negative.   Respiratory: Negative.  Negative for cough.   Cardiovascular: Negative.  Negative for chest pain.  Gastrointestinal: Positive for nausea. Negative for vomiting.  Genitourinary: Positive for flank pain. Negative for urgency, frequency and hematuria.  Musculoskeletal: Negative.   Skin: Negative.   Neurological: Negative.   Endo/Heme/Allergies: Negative.   Psychiatric/Behavioral: Negative.     Blood pressure 110/50, pulse 92, temperature 100.4 F (38 C), temperature source Oral, resp. rate 24, SpO2 98.00%. Physical Exam  Constitutional: He is oriented to person, place, and time. He appears well-developed and well-nourished.       Family in room  HENT:  Head: Normocephalic and atraumatic.  Eyes: EOM are normal.  Neck: Normal range of motion. Neck supple.  Cardiovascular: Normal rate and regular rhythm.   Respiratory: Effort normal and breath sounds normal.  GI: Soft. Bowel sounds are normal.       Old port sites from prior lap surgery. No hernias. Obese.  Genitourinary: Penis normal.  Musculoskeletal: Normal range of motion.  Neurological: He is alert and oriented to person, place, and time.  Skin: Skin is warm and dry.  Psychiatric: He has a normal mood and affect. His behavior is normal. Judgment and thought content normal.     Assessment/Plan  1 - Left Ureteral Stone - Pt with evidence of obstruction (delayed nephrogram, proximal hydro), pain, and s/s systemic infection. Needs renal drainage, ABX, and delayed stone treatment when infection subsided. Discussed options of renal drainage with neph tube vs. Ureteral stent. Pt wants stent.   Consented / Posted for urgent left ureteral stent. Risks including bleeding, infection, damage to kidney / ureter / bladder / urethra as well as stent failure, stent colic, need for neph tube discussed. General risks including mortality, DVT/PE discussed as well.  2 - Pyelonephritis - Strongest  indication for urgent drainage. Received first dose IV ABX in ER and plan to continue post-op with hospital admission.  Luevenia Mcavoy 07/18/2011, 4:08 PM

## 2011-07-18 NOTE — Anesthesia Postprocedure Evaluation (Signed)
  Anesthesia Post-op Note  Patient: [name] [name]  Procedure(s) Performed:  CYSTOSCOPY WITH STENT REPLACEMENT  Patient Location: PACU  Anesthesia Type: General  Level of Consciousness: awake and alert   Airway and Oxygen Therapy: Patient Spontanous Breathing  Post-op Pain: mild  Post-op Assessment: Post-op Vital signs reviewed, Patient's Cardiovascular Status Stable, Respiratory Function Stable, Patent Airway and No signs of Nausea or vomiting  Post-op Vital Signs: stable  Complications: No apparent anesthesia complications

## 2011-07-18 NOTE — Transfer of Care (Signed)
Immediate Anesthesia Transfer of Care Note  Patient: [name] [name]  Procedure(s) Performed:  CYSTOSCOPY WITH STENT REPLACEMENT  Patient Location: PACU  Anesthesia Type: General  Level of Consciousness: awake and alert   Airway & Oxygen Therapy: Patient Spontanous Breathing and Patient connected to face mask  Post-op Assessment: Report given to PACU RN and Post -op Vital signs reviewed and stable  Post vital signs: Reviewed and stable Filed Vitals:   07/18/11 1600  BP: 101/45  Pulse: 96  Temp:   Resp: 19    Complications: No apparent anesthesia complications

## 2011-07-18 NOTE — Anesthesia Preprocedure Evaluation (Addendum)
Anesthesia Evaluation  Patient identified by MRN, date of birth, ID band Patient awake    Reviewed: Allergy & Precautions, H&P , NPO status , Patient's Chart, lab work & pertinent test results  Airway Mallampati: II TM Distance: >3 FB Neck ROM: Full    Dental No notable dental hx.    Pulmonary neg pulmonary ROS,  clear to auscultation  Pulmonary exam normal       Cardiovascular hypertension, neg cardio ROS Regular Normal    Neuro/Psych Negative Neurological ROS  Negative Psych ROS   GI/Hepatic negative GI ROS, Neg liver ROS, GERD-  ,  Endo/Other  Negative Endocrine ROSMorbid obesity  Renal/GU negative Renal ROS  Genitourinary negative   Musculoskeletal negative musculoskeletal ROS (+)   Abdominal   Peds negative pediatric ROS (+)  Hematology negative hematology ROS (+)   Anesthesia Other Findings   Reproductive/Obstetrics negative OB ROS                           Anesthesia Physical Anesthesia Plan  ASA: III  Anesthesia Plan: General   Post-op Pain Management:    Induction: Intravenous  Airway Management Planned: Oral ETT  Additional Equipment:   Intra-op Plan:   Post-operative Plan: Extubation in OR  Informed Consent: I have reviewed the patients History and Physical, chart, labs and discussed the procedure including the risks, benefits and alternatives for the proposed anesthesia with the patient or authorized representative who has indicated his/her understanding and acceptance.   Dental advisory given  Plan Discussed with: CRNA  Anesthesia Plan Comments:         Anesthesia Quick Evaluation  

## 2011-07-18 NOTE — ED Provider Notes (Signed)
History     CSN: [id number]  Arrival date & time 07/18/11  2130   First MD Initiated Contact with Patient 07/18/11 1010      Chief Complaint  Patient presents with  . Abdominal Pain  . Nausea     Patient is a 66 y.o. male presenting with abdominal pain. The history is provided by the patient and a relative.  Abdominal Pain The primary symptoms of the illness include abdominal pain and nausea. The primary symptoms of the illness do not include shortness of breath, vomiting or diarrhea. The current episode started 6 to 12 hours ago. The onset of the illness was gradual. The problem has been rapidly worsening.  Additional symptoms associated with the illness include chills. Symptoms associated with the illness do not include back pain.  pt with colonoscopy yesterday No immediate complications Then in the night he began to have diffuse abd pain with nausea No cp/sob reported No fever recorded He has not any further BM since colonoscopy  Past Medical History  Diagnosis Date  . Hypertension   . Difficulty urinating   . Abdominal pain   . GERD (gastroesophageal reflux disease)     Past Surgical History  Procedure Date  . Gastric restriction surgery 12/11/2008    lap band  . Laparoscopic gastric banding 12/11/2008    Family History  Problem Relation Age of Onset  . Cancer Mother     ovarian  . Other Father     MVA    History  Substance Use Topics  . Smoking status: Never Smoker   . Smokeless tobacco: Never Used  . Alcohol Use: No      Review of Systems  Constitutional: Positive for chills.  Respiratory: Negative for shortness of breath.   Gastrointestinal: Positive for nausea and abdominal pain. Negative for vomiting and diarrhea.  Musculoskeletal: Negative for back pain.  All other systems reviewed and are negative.    Allergies  Review of patient's allergies indicates no known allergies.  Home Medications   Current Outpatient Rx  Name Route Sig Dispense  Refill  . ALFUZOSIN HCL ER 10 MG PO TB24 Oral Take 10 mg by mouth daily.      Marland Kitchen AMLODIPINE BESYLATE 10 MG PO TABS      . CHEWABLE CALCIUM/D PO Oral Take by mouth.      . FUROSEMIDE 40 MG PO TABS      . CHEWABLE MULTIPLE VITAMINS PO Oral Take by mouth.      Marland Kitchen MICARDIS PO Oral Take by mouth.        BP 174/99  Pulse 80  Temp(Src) 98.2 F (36.8 C) (Oral)  Resp 16  SpO2 98%  Physical Exam CONSTITUTIONAL: Well developed/well nourished HEAD AND FACE: Normocephalic/atraumatic EYES: EOMI/PERRL ENMT: Mucous membranes moist NECK: supple no meningeal signs SPINE:entire spine nontender CV: S1/S2 noted, no murmurs/rubs/gallops noted LUNGS: Lungs are clear to auscultation bilaterally, no apparent distress ABDOMEN: soft, diffuse tenderness noted, no rebound/guarding is noted, +BS noted GU:no cva tenderness, no hernia noted, no testicular tenderness NEURO: Pt is awake/alert, moves all extremitiesx4 EXTREMITIES: pulses normal, full ROM SKIN: warm, color normal PSYCH: pt anxious, uncomfortable appearing  ED Course  Procedures  Labs Reviewed  CBC - Abnormal; Notable for the following:    WBC 14.8 (*)    All other components within normal limits  DIFFERENTIAL - Abnormal; Notable for the following:    Neutrophils Relative 96 (*)    Neutro Abs 14.1 (*)    Lymphocytes Relative  3 (*)    Lymphs Abs 0.5 (*)    Monocytes Relative 1 (*)    All other components within normal limits  BASIC METABOLIC PANEL   40:98 AM Pt s/p colonoscopy, now with diffuse abd pain/nausea Will need CT imaging Will follow closely I did order CXR as he did have some brief hypoxia while I was in room  11:36 AM Pt reports he is improving Awaiting CT imaging  1:37 PM Discussed ct findings, no complication He does have ureteral stone Pt improved, abd soft He and his wife were informed of lung nodule, need f/u CT chest in 6 months They are agreeable  D/w dr Berneice Heinrich on for urology, will start flomax/pain meds and  outpatient followup   3:17 PM Pt now febrile uti noted Will need admission and likely ureteral stent D/w dr Berneice Heinrich, will see patient  MDM  Nursing notes reviewed and considered in documentation All labs/vitals reviewed and considered Previous records reviewed and considered xrays reviewed and considered        Date: 07/18/2011  Rate: 81  Rhythm: normal sinus rhythm  QRS Axis: normal  Intervals: normal  ST/T Wave abnormalities: nonspecific ST changes  Conduction Disutrbances:none  Narrative Interpretation:   Old EKG Reviewed: unchanged    Joya Gaskins, MD 07/18/11 [phone]

## 2011-07-18 NOTE — Interval H&P Note (Signed)
History and Physical Interval Note:  07/18/2011 4:54 PM  [name] [name]  has presented today for surgery, with the diagnosis of calculus  The various methods of treatment have been discussed with the patient and family. After consideration of risks, benefits and other options for treatment, the patient has consented to  Procedure(s): CYSTOSCOPY WITH STENT REPLACEMENT as a surgical intervention .  The patients' history has been reviewed, patient examined, no change in status, stable for surgery.  I have reviewed the patients' chart and labs.  Questions were answered to the patient's satisfaction.     [name] [name]  No Interval change to above.

## 2011-07-18 NOTE — ED Notes (Signed)
Patient is alert and oriented x3.  He is complaining of nausea and abdominal pain  S/P colonoscopy yesterday.  Pain started at 1:30 and has been increasing since then. Elevated BP due to not taking medications prior to colonoscopy

## 2011-07-19 DIAGNOSIS — N12 Tubulo-interstitial nephritis, not specified as acute or chronic: Secondary | ICD-10-CM | POA: Diagnosis not present

## 2011-07-19 DIAGNOSIS — N201 Calculus of ureter: Secondary | ICD-10-CM | POA: Diagnosis not present

## 2011-07-19 LAB — CBC
HCT: 33.2 % — ABNORMAL LOW (ref 39.0–52.0)
MCHC: 34 g/dL (ref 30.0–36.0)
MCV: 93.5 fL (ref 78.0–100.0)
RDW: 12.9 % (ref 11.5–15.5)
WBC: 25.9 10*3/uL — ABNORMAL HIGH (ref 4.0–10.5)

## 2011-07-19 LAB — BASIC METABOLIC PANEL
BUN: 22 mg/dL (ref 6–23)
Chloride: 98 mEq/L (ref 96–112)
GFR calc non Af Amer: 52 mL/min — ABNORMAL LOW (ref >90)
Glucose, Bld: 149 mg/dL — ABNORMAL HIGH (ref 70–99)
Potassium: 3.6 mEq/L (ref 3.5–5.1)
Sodium: 133 mEq/L — ABNORMAL LOW (ref 135–145)

## 2011-07-19 MED ORDER — ACETAMINOPHEN 650 MG RE SUPP
RECTAL | Status: AC
  Administered 2011-07-19: 650 mg
  Filled 2011-07-19: qty 1

## 2011-07-19 NOTE — Op Note (Signed)
NAME:  [name] [name], [name] [name]                 ACCOUNT NO.:  [id number]  MEDICAL RECORD NO.:  [id number]  LOCATION:  1445                         FACILITY:  [facility]  PHYSICIAN:  Sebastian Ache, MD     DATE OF BIRTH:  05-Mar-1946  DATE OF PROCEDURE: DATE OF DISCHARGE:                              OPERATIVE REPORT   PREOPERATIVE DIAGNOSIS:  Left ureteral stone and pyelonephritis.  PROCEDURES: 1. Cystoscopy. 2. Left retrograde pyelogram with interpretation. 3. Insertion of left ureteral stent.  FINDINGS: 1. Unremarkable urinary bladder and urethra. 2. Mild proximal hydroureteronephrosis consistent with known proximal     stone, efflux of cloudy-appearing urine following decompression and     stenting.  STENT:  6 x 26 double-J in the left, no tether.  ESTIMATED BLOOD LOSS:  Nil.  COMPLICATIONS:  None.  DRAINS:  None.  INDICATIONS FOR PROCEDURE:  [name] is a 66 year old gentleman with approximately 18 hours of left flank and groin pain.  He was found on CT imaging to have a left proximal ureteral stone as well as infectious parameters including leukocytosis and urine with positive nitrites. This clinical picture was worrisome for an infected obstructed stone. Options for renal decompression were discussed including ureteral stenting versus nephrostomy tube and the patient wishes to proceed with a trial of ureteral stenting.  Informed consent was obtained and placed in the medical record.  PROCEDURE IN DETAIL:  Patient being [name], verified; procedure being left ureteral stent was confirmed.  The procedure was carried Out, intravenous antibiotics were administered. General anesthesia was introduced.  Patient was placed into a low lithotomy position.  A sterile field was created by prepping and draping the patient's penis, perineum and proximal thighs using iodine x3.  Next, a cystourethroscopy was performed using a 22-French rigid cystoscope with 30-degree lens.  Inspection of  the anterior and posterior urethra was unremarkable.  There was mild bilobar prostatic hypertrophy with an intravesical component of median lobe.  Urinary bladder revealed no diverticula, calcifications, or papillary lesions. Ureteral orifices were in the normal anatomic position.  The left ureteral orifice was gently cannulated using a 6-French angle catheter and left retrograde pyelogram was seen.  Left retrograde pyelogram demonstrated a single left ureter, that was decompressed distally.  There was a filling defect proximally, consistent with known stone with proximal mild hydronephrosis.  A sensor type wire was then advanced over the left renal pelvis, over which a new 6 x 26 double-J stent was placed using cystoscopic and fluoroscopic guidance.  Good proximal and distal curls were noted.  Efflux of cloudy- appearing urine was seen through and around the distal end of the stent. Bladder was emptied per cystoscope.  Procedure was then terminated. Patient tolerated the procedure well.  There were no immediate periprocedural complications.  Patient was taken to postanesthesia care unit in stable condition.          ______________________________ Sebastian Ache, MD     TM/MEDQ  D:  07/18/2011  T:  07/19/2011  Job:  [id number]

## 2011-07-19 NOTE — Progress Notes (Addendum)
Patient fever 100.7.  Gave patient tylenol suppository and reduce to 98.6.  NT rechecked temperature 10 minutes later and it spiked to 101.2.

## 2011-07-19 NOTE — Progress Notes (Signed)
1 Day Post-Op  Subjective: No events overnight. Feeling much better. Mild stent discomfort when voiding. Afebrile.  1 - Left Ureteral Stone / Pyelo - s/p left ureteral stent on day of admission, now on IV ABX (rocephin).  Objective: Vital signs in last 24 hours: Temp:  [98 F (36.7 C)-100.5 F (38.1 C)] 98.7 F (37.1 C) (01/13 0614) Pulse Rate:  [72-97] 75  (01/13 0614) Resp:  [16-24] 20  (01/13 0614) BP: (101-174)/(45-99) 123/71 mmHg (01/13 0614) SpO2:  [93 %-100 %] 98 % (01/13 0614) Weight:  [132.45 kg (292 lb)] 132.45 kg (292 lb) (01/12 1849) Last BM Date: 07/17/11  Intake/Output from previous day: 01/12 0701 - 01/13 0700 In: 2920 [P.O.:720; I.V.:2200] Out: 505 [Urine:500; Blood:5] Intake/Output this shift:    General appearance: cooperative and no distress Head: Normocephalic, without obvious abnormality, atraumatic Resp: clear to auscultation bilaterally Cardio: regular rate and rhythm, S1, S2 normal, no murmur, click, rub or gallop GI: soft, non-tender; bowel sounds normal; no masses,  no organomegaly Male genitalia: normal, penis: no lesions or discharge. testes: no masses or tenderness. no hernias Extremities: extremities normal, atraumatic, no cyanosis or edema Pulses: 2+ and symmetric Neurologic: Grossly normal  Lab Results:   Basename 07/19/11 0400 07/18/11 1006  WBC 25.9* 14.8*  HGB 11.3* 13.3  HCT 33.2* 39.2  PLT 145* 190   BMET  Basename 07/19/11 0400 07/18/11 1006  NA 133* 138  K 3.6 3.7  CL 98 101  CO2 28 25  GLUCOSE 149* 155*  BUN 22 13  CREATININE 1.37* 0.99  CALCIUM 8.1* 9.1   PT/INR No results found for this basename: LABPROT:2,INR:2 in the last 72 hours ABG No results found for this basename: PHART:2,PCO2:2,PO2:2,HCO3:2 in the last 72 hours  Studies/Results: Ct Abdomen Pelvis W Contrast  07/18/2011  *RADIOLOGY REPORT*  Clinical Data: Abdominal pain, status post colonoscopy.  Nausea. Colonoscopy yesterday.  Elevated blood pressure.   CT ABDOMEN AND PELVIS WITH CONTRAST  Technique:  Multidetector CT imaging of the abdomen and pelvis was performed following the standard protocol during bolus administration of intravenous contrast.  Contrast: OMNIPAQUE IOHEXOL 300 MG/ML IV SOLN  Comparison: 04/01/2011 upper GI.  No prior CT.  Findings:  A subpleural 4 mm left lower lobe lung nodule on image 13.  Mild cardiomegaly without pericardial or pleural effusion.  Suspect mild hepatic steatosis.  No focal liver lesion.  Normal spleen.  Status post lap band.  Normal distal stomach, pancreas, gallbladder, biliary tract, adrenal glands, right kidney. Moderate left-sided urinary tract obstruction secondary to a 6 mm proximal left ureteric calculus on transverse image 49 and coronal image 38.  No distal hydroureter or ureteric stone. Delayed images demonstrate delayed excretion of contrast by the obstructed left kidney.  No retroperitoneal or retrocrural adenopathy.  Scattered colonic diverticula.  Normal terminal ileum and appendix. Mild increased density in the ileocolic mesenteric fat on image 57. Small bowel otherwise normal. No ascites.  No pelvic adenopathy. Normal urinary bladder.  Mild prostatomegaly. No significant free fluid.  Degenerative disc disease at the lumbosacral junction. Transitional S1 vertebral body.  IMPRESSION:  1.  6 mm proximal left ureteric obstructive calculus. 2.  Status post lap band, without acute complication identified. 3.  Subtle ileocolic mesenteric findings which could represent mesenteric panniculitis. 4.  4 mm left lower lobe lung nodule. If the patient is at high risk for bronchogenic carcinoma, follow-up chest CT at 1 year is recommended.  If the patient is at low risk, no follow-up is  needed.   This recommendation follows the consensus statement: "Guidelines for Management of Small Pulmonary Nodules Detected on CT Scans:  A Statement from the Fleischner Society" as published in Radiology 2005; 237:395-400.  Available  online at: DietDisorder.cz.  Original Report Authenticated By: Consuello Bossier, M.D.   Dg Chest Port 1 View  07/18/2011  *RADIOLOGY REPORT*  Clinical Data: Left lower quadrant abdominal pain following colonoscopy yesterday.  Nausea.  History of smoking, lap band.  PORTABLE CHEST - 1 VIEW  Comparison: 06/17/2010  Findings: Heart is enlarged.  There is interstitial edema.  No overt edema.  No focal consolidations.  No evidence for free intraperitoneal air. Lap band is partially imaged.  IMPRESSION: Cardiomegaly and interstitial edema.  Original Report Authenticated By: Patterson Hammersmith, M.D.    Anti-infectives: Anti-infectives     Start     Dose/Rate Route Frequency Ordered Stop   07/19/11 1500   cefTRIAXone (ROCEPHIN) 1 g in dextrose 5 % 50 mL IVPB        1 g 100 mL/hr over 30 Minutes Intravenous Every 24 hours 07/18/11 1821     07/18/11 1530   cefTRIAXone (ROCEPHIN) 1 g in dextrose 5 % 50 mL IVPB        1 g 100 mL/hr over 30 Minutes Intravenous  Once 07/18/11 1517 07/18/11 1644   07/18/11 1500   cephALEXin (KEFLEX) capsule 500 mg        500 mg Oral  Once 07/18/11 1447 07/18/11 1505   07/18/11 0000   cephALEXin (KEFLEX) 500 MG capsule        500 mg Oral 4 times daily 07/18/11 1447 07/28/11 2359          Assessment/Plan:  LOS: 1 day   1 - Left Ureteral Stone - solitary stone, first episode. S/p stenting for renal decompression and passive ureteral dilation. Will need definitive therapy with URS or SWL after infection cleared.  2 - Pyelonephritis - On emperic Rocephin, awaiting final UCX, continue.  Leukocytosis today typical and likely "bounce", fever curve improved.   3 - Hold benicar for slight rise in Cr. Check Daily labs tomorrow AM.  4 - Remain in house. Plan for D/C when afebrile x 24 hours.   [facility], [name] [name] 07/19/2011

## 2011-07-19 NOTE — Progress Notes (Signed)
07-18-11  NSG:  Pt reveals that he has a history Sleep Apnea and used to wear c-pap at home.  It has improved with his weight loss.  Placed on continuous pulse ox and on 2l/m [place] he is sating 94% consistently (even after Dilaudid IV).  Instructed on DB&C, IS, walking, turning, and leg excercises.  Urine is tea colored, c flecks of mucous and scant amount of sediment.  We are straining his urine and will place sediment in collection container for md to observe.

## 2011-07-19 NOTE — Progress Notes (Addendum)
Paged MD regarding temperature of 101.2 (per order instructions) .  No new orders received.

## 2011-07-20 ENCOUNTER — Encounter (HOSPITAL_COMMUNITY): Payer: Self-pay | Admitting: Urology

## 2011-07-20 DIAGNOSIS — N201 Calculus of ureter: Secondary | ICD-10-CM | POA: Diagnosis not present

## 2011-07-20 DIAGNOSIS — N12 Tubulo-interstitial nephritis, not specified as acute or chronic: Secondary | ICD-10-CM | POA: Diagnosis not present

## 2011-07-20 LAB — BASIC METABOLIC PANEL
BUN: 17 mg/dL (ref 6–23)
Calcium: 8.1 mg/dL — ABNORMAL LOW (ref 8.4–10.5)
GFR calc non Af Amer: 63 mL/min — ABNORMAL LOW (ref >90)
Glucose, Bld: 148 mg/dL — ABNORMAL HIGH (ref 70–99)
Sodium: 134 mEq/L — ABNORMAL LOW (ref 135–145)

## 2011-07-20 LAB — CBC
Hemoglobin: 10.4 g/dL — ABNORMAL LOW (ref 13.0–17.0)
MCH: 30.8 pg (ref 26.0–34.0)
MCHC: 32.8 g/dL (ref 30.0–36.0)

## 2011-07-20 NOTE — Progress Notes (Signed)
07-19-11 NSG: Urine is now dark amber, we have been straining urine all shift and no stones or sediment noted.  Pt continues ot have occassional intense sharp l flank pain that is controlled with percocet or dilaudid and temps too

## 2011-07-21 DIAGNOSIS — N201 Calculus of ureter: Secondary | ICD-10-CM | POA: Diagnosis not present

## 2011-07-21 DIAGNOSIS — N12 Tubulo-interstitial nephritis, not specified as acute or chronic: Secondary | ICD-10-CM | POA: Diagnosis not present

## 2011-07-21 LAB — BASIC METABOLIC PANEL
Calcium: 8 mg/dL — ABNORMAL LOW (ref 8.4–10.5)
GFR calc Af Amer: >90 mL/min (ref >90)
GFR calc non Af Amer: 88 mL/min — ABNORMAL LOW (ref >90)
Sodium: 133 mEq/L — ABNORMAL LOW (ref 135–145)

## 2011-07-21 LAB — CBC
MCH: 31.6 pg (ref 26.0–34.0)
MCHC: 33.8 g/dL (ref 30.0–36.0)
Platelets: 122 10*3/uL — ABNORMAL LOW (ref 150–400)
RBC: 3.2 MIL/uL — ABNORMAL LOW (ref 4.22–5.81)
RDW: 12.7 % (ref 11.5–15.5)

## 2011-07-21 MED ORDER — OXYCODONE-ACETAMINOPHEN 5-325 MG PO TABS: 1.0000 | ORAL_TABLET | ORAL | Status: AC | PRN

## 2011-07-21 MED ORDER — CIPROFLOXACIN HCL 500 MG PO TABS: 500.0000 mg | ORAL_TABLET | Freq: Two times a day (BID) | ORAL | Status: AC

## 2011-07-21 NOTE — Progress Notes (Signed)
3 Days Post-Op Subjective: Patient reports that he is feeling much better. He is tolerating food well. He has had no significant pain although some dysuria with the staff..  Objective: Vital signs in last 24 hours: Temp:  [98.7 F (37.1 C)-99.1 F (37.3 C)] 98.9 F (37.2 C) (01/14 2208) Pulse Rate:  [78-88] 78  (01/14 2208) Resp:  [16-18] 16  (01/14 2208) BP: (102-136)/(61-75) 102/61 mmHg (01/14 2208) SpO2:  [92 %-99 %] 92 % (01/14 2208)  Intake/Output from previous day: 01/14 0701 - 01/15 0700 In: 2440 [P.O.:440; I.V.:2000] Out: 2200 [Urine:2200] Intake/Output this shift: Total I/O In: 500 [I.V.:500] Out: 700 [Urine:700]  Physical Exam:  General:the patient appears comfortable.he is in no distress.  Lab Results:  Basename 07/21/11 0500 07/20/11 0550 07/19/11 0400  HGB 10.1* 10.4* 11.3*  HCT 29.9* 31.7* 33.2*   BMET  Basename 07/21/11 0500 07/20/11 0550  NA 133* 134*  K 3.7 3.8  CL 100 99  CO2 26 27  GLUCOSE 160* 148*  BUN 12 17  CREATININE 0.86 1.16  CALCIUM 8.0* 8.1*   No results found for this basename: LABPT:3,INR:3 in the last 72 hours No results found for this basename: LABURIN:1 in the last 72 hours Results for orders placed during the hospital encounter of 07/18/11  URINE CULTURE     Status: Normal   Collection Time   07/18/11  2:46 PM      Component Value Range Status Comment   Specimen Description URINE, CLEAN CATCH   Final    Special Requests NONE   Final    Setup Time [id number]   Final    Colony Count >=100,000 COLONIES/ML   Final    Culture ESCHERICHIA COLI   Final    Report Status 07/21/2011 FINAL   Final    Organism ID, Bacteria ESCHERICHIA COLI   Final     Studies/Results: No results found.  Assessment/Plan  S/p ureteral stenting for infected/obstructing stone, doing well. He will be discharged today.   LOS: 3 days   Marcine Matar M 07/21/2011, 5:56 AM

## 2011-07-21 NOTE — Discharge Summary (Signed)
Physician Discharge Summary  Patient ID: [name] [name] MRN: [id number] DOB/AGE: 1945/12/13 66 y.o.  Admit date: 07/18/2011 Discharge date: 07/21/2011  Admission Diagnoses:  Discharge Diagnoses:  Active Problems:  Left Ureteral stone  Pyelonephritis   Discharged Condition: good  Hospital Course: the patient was admitted urgently for cystoscopy and stent placement for treatment of an infected, obstructing stone. He did well postoperatively, defervesced appropriately. Urine culture finally grew Escherichia coli which was sensitive to ciprofloxacin, which he will be sent home on. Consults:none  Significant Diagnostic Studies: microbiology: urine culture: positive for Escherichia coli, sensitive to Cipro  Treatments: cystoscopy, double-J stent placement  Discharge Exam: Blood pressure 102/61, pulse 78, temperature 98.9 F (37.2 C), temperature source Oral, resp. rate 16, height 6' (1.829 m), weight 132.45 kg (292 lb), SpO2 92.00%. He appeared comfortable and in no distress.   Disposition: Home or Self Care   Medication List  As of 07/21/2011  6:04 AM   TAKE these medications         alfuzosin 10 MG 24 hr tablet   Commonly known as: UROXATRAL   Take 10 mg by mouth daily.      amLODipine 10 MG tablet   Commonly known as: NORVASC      cephALEXin 500 MG capsule   Commonly known as: KEFLEX   Take 1 capsule (500 mg total) by mouth 4 (four) times daily.      CHEWABLE CALCIUM/D PO   Take by mouth.      CHEWABLE MULTIPLE VITAMINS PO   Take by mouth.      ciprofloxacin 500 MG tablet   Commonly known as: CIPRO   Take 1 tablet (500 mg total) by mouth 2 (two) times daily.      furosemide 40 MG tablet   Commonly known as: LASIX      MICARDIS PO   Take by mouth.      oxyCODONE-acetaminophen 5-325 MG per tablet   Commonly known as: PERCOCET   Take 1 tablet by mouth every 4 (four) hours as needed for pain.      oxyCODONE-acetaminophen 5-325 MG per tablet   Commonly known  as: PERCOCET   Take 1-2 tablets by mouth every 4 (four) hours as needed for pain.      Tamsulosin HCl 0.4 MG Caps   Commonly known as: FLOMAX   Take 1 capsule (0.4 mg total) by mouth at bedtime.           Follow-up Information    Follow up with Clarence Dunsmore, Bertram Millard, MD in 2 days. (we will call you)    Contact information:   [address] [place] [address] [phone]       Follow up with Michiel Sites, MD .         Signed: Chelsea Aus 07/21/2011, 6:04 AM

## 2011-07-27 ENCOUNTER — Encounter (INDEPENDENT_AMBULATORY_CARE_PROVIDER_SITE_OTHER): Payer: Self-pay | Admitting: Endocrinology

## 2011-07-31 ENCOUNTER — Encounter (HOSPITAL_COMMUNITY): Payer: Self-pay | Admitting: *Deleted

## 2011-07-31 ENCOUNTER — Other Ambulatory Visit: Payer: Self-pay | Admitting: Urology

## 2011-07-31 DIAGNOSIS — N200 Calculus of kidney: Secondary | ICD-10-CM | POA: Diagnosis not present

## 2011-07-31 DIAGNOSIS — N12 Tubulo-interstitial nephritis, not specified as acute or chronic: Secondary | ICD-10-CM | POA: Diagnosis not present

## 2011-08-03 ENCOUNTER — Encounter (HOSPITAL_COMMUNITY)
Admission: RE | Disposition: A | Discharge status: Home / Self Care | Payer: Self-pay | Source: Ambulatory Visit | Attending: Urology

## 2011-08-03 ENCOUNTER — Ambulatory Visit (HOSPITAL_COMMUNITY): Payer: Medicare Other

## 2011-08-03 ENCOUNTER — Ambulatory Visit (HOSPITAL_COMMUNITY)
Admission: RE | Admit: 2011-08-03 | Discharge: 2011-08-03 | Disposition: A | Discharge status: Home / Self Care | Payer: Medicare Other | Source: Ambulatory Visit | Attending: Urology | Admitting: Urology

## 2011-08-03 ENCOUNTER — Encounter (HOSPITAL_COMMUNITY): Payer: Self-pay | Admitting: *Deleted

## 2011-08-03 DIAGNOSIS — N201 Calculus of ureter: Secondary | ICD-10-CM | POA: Insufficient documentation

## 2011-08-03 DIAGNOSIS — R109 Unspecified abdominal pain: Secondary | ICD-10-CM | POA: Diagnosis not present

## 2011-08-03 DIAGNOSIS — Z09 Encounter for follow-up examination after completed treatment for conditions other than malignant neoplasm: Secondary | ICD-10-CM | POA: Diagnosis not present

## 2011-08-03 DIAGNOSIS — I10 Essential (primary) hypertension: Secondary | ICD-10-CM | POA: Insufficient documentation

## 2011-08-03 DIAGNOSIS — Z79899 Other long term (current) drug therapy: Secondary | ICD-10-CM | POA: Insufficient documentation

## 2011-08-03 DIAGNOSIS — N200 Calculus of kidney: Secondary | ICD-10-CM | POA: Insufficient documentation

## 2011-08-03 DIAGNOSIS — K219 Gastro-esophageal reflux disease without esophagitis: Secondary | ICD-10-CM | POA: Diagnosis not present

## 2011-08-03 SURGERY — LITHOTRIPSY, ESWL
Anesthesia: LOCAL | Laterality: Left

## 2011-08-03 MED ORDER — DIPHENHYDRAMINE HCL 25 MG PO CAPS
ORAL_CAPSULE | ORAL | Status: AC
  Filled 2011-08-03: qty 2

## 2011-08-03 MED ORDER — LEVOFLOXACIN 500 MG PO TABS
500.0000 mg | ORAL_TABLET | Freq: Once | ORAL | Status: AC
  Administered 2011-08-03: 500 mg via ORAL
  Filled 2011-08-03 (×2): qty 1

## 2011-08-03 MED ORDER — DIPHENHYDRAMINE HCL 25 MG PO TABS
25.0000 mg | ORAL_TABLET | Freq: Once | ORAL | Status: AC
  Administered 2011-08-03: 25 mg via ORAL

## 2011-08-03 MED ORDER — DEXTROSE-NACL 5-0.45 % IV SOLN
Freq: Once | INTRAVENOUS | Status: AC
  Administered 2011-08-03: 08:00:00 via INTRAVENOUS

## 2011-08-03 MED ORDER — DIAZEPAM 5 MG PO TABS
10.0000 mg | ORAL_TABLET | Freq: Once | ORAL | Status: AC
  Administered 2011-08-03: 10 mg via ORAL

## 2011-08-03 MED ORDER — DIAZEPAM 5 MG PO TABS
ORAL_TABLET | ORAL | Status: AC
  Filled 2011-08-03: qty 2

## 2011-08-03 NOTE — H&P (Signed)
Urology Admission H&P  Chief Complaint: kidney stone Lithotripsy of a left renal stone History of Present Illness:  This 66 year old gentleman comes in today for followup. He was admitted to [facility] approximately 2 weeks ago with an infected, obstructive left upper ureteral stone. He was treated emergently with cystoscopy and double-J stent placement. He left the hospital approximately 2 days following that. He has not been bothered too much by the stent. He denies significant gross hematuria. He has had no further left flank pain. He denies any prior history of kidney stones.  CT at the time of admission revealed a 6 mm proximal left ureteral stone without any other renal calculi.  Followup last week in the office revealed that the stone was now in the left lower pole. He presents now for lithotripsy.   Past Medical History  Diagnosis Date  . Difficulty urinating   . Abdominal pain   . Hypertension     resolved with lap band 2 years ago  . GERD (gastroesophageal reflux disease)     resolved with lap band 2 years ago   Past Surgical History  Procedure Date  . Gastric restriction surgery 12/11/2008    lap band  . Laparoscopic gastric banding 12/11/2008  . Cystoscopy w/ ureteral stent placement 07/18/2011    Procedure: CYSTOSCOPY WITH STENT REPLACEMENT;  Surgeon: Sebastian Ache;  Location: [facility];  Service: Urology;  Laterality: Left;    Home Medications:  Prescriptions prior to admission  Medication Sig Dispense Refill  . alfuzosin (UROXATRAL) 10 MG 24 hr tablet Take 10 mg by mouth daily.       Marland Kitchen amLODipine (NORVASC) 10 MG tablet Take 10 mg by mouth daily.       Marland Kitchen aspirin 325 MG EC tablet Take 325 mg by mouth daily.      Marland Kitchen docusate sodium (COLACE) 100 MG capsule Take 100 mg by mouth 2 (two) times daily.      . Pediatric Multiple Vitamins (CHEWABLE MULTIPLE VITAMINS PO) Take by mouth.        . Telmisartan (MICARDIS PO) Take 80 mg by mouth once.       . Calcium  Carb-Ergocalciferol (CHEWABLE CALCIUM/D PO) Take by mouth.        . furosemide (LASIX) 40 MG tablet Take 40 mg by mouth daily.       . Tamsulosin HCl (FLOMAX) 0.4 MG CAPS Take 1 capsule (0.4 mg total) by mouth at bedtime.  7 capsule  0   Allergies: No Known Allergies  Family History  Problem Relation Age of Onset  . Cancer Mother     ovarian  . Other Father     MVA   Social History:  reports that he quit smoking about 53 years ago. His smoking use included Cigarettes. He has a 6 pack-year smoking history. He quit smokeless tobacco use about 41 years ago. He reports that he does not drink alcohol or use illicit drugs.  Review of Systems  Genitourinary: Positive for urgency and frequency.  All other systems reviewed and are negative.    Physical Exam:  Vital signs in last 24 hours: Temp:  [98.5 F (36.9 C)] 98.5 F (36.9 C) (01/28 0651) Pulse Rate:  [72] 72  (01/28 0651) Resp:  [18] 18  (01/28 0651) BP: (141)/(83) 141/83 mmHg (01/28 0651) SpO2:  [97 %] 97 % (01/28 0651) Weight:  [124.739 kg (275 lb)] 124.739 kg (275 lb) (01/28 0753) Physical Exam  Constitutional: He is oriented to person, place,  and time. He appears well-developed and well-nourished.  HENT:  Head: Normocephalic and atraumatic.  Eyes: Conjunctivae are normal.  Neck: Normal range of motion.  Cardiovascular: Normal rate and regular rhythm.   Respiratory: Effort normal and breath sounds normal.  GI: Soft.       Obese  Musculoskeletal: Normal range of motion.  Neurological: He is alert and oriented to person, place, and time.  Skin: Skin is warm and dry.  Psychiatric: He has a normal mood and affect. Thought content normal.    Laboratory Data:  No results found for this or any previous visit (from the past 24 hour(s)). No results found for this or any previous visit (from the past 240 hour(s)). Creatinine: No results found for this basename: CREATININE:7 in the last 168 hours Baseline Creatinine:     Impression/Assessment:  Left renal stone  Plan:  Left lithotripsy  Marcine Matar M 08/03/2011, 8:55 AM

## 2011-08-17 DIAGNOSIS — N200 Calculus of kidney: Secondary | ICD-10-CM | POA: Diagnosis not present

## 2011-08-28 ENCOUNTER — Encounter (INDEPENDENT_AMBULATORY_CARE_PROVIDER_SITE_OTHER): Payer: Self-pay

## 2011-08-28 ENCOUNTER — Ambulatory Visit (INDEPENDENT_AMBULATORY_CARE_PROVIDER_SITE_OTHER): Payer: Medicare Other | Admitting: Physician Assistant

## 2011-08-28 VITALS — BP 136/88 | HR 68 | Temp 97.6°F | Resp 16 | Ht 72.0 in | Wt 287.6 lb

## 2011-08-28 DIAGNOSIS — Z4651 Encounter for fitting and adjustment of gastric lap band: Secondary | ICD-10-CM

## 2011-08-28 NOTE — Progress Notes (Signed)
  HISTORY: [name] [name] is a 66 y.o.male who received an AP-Large with revision x 2 lap-band in June 2010 by Dr. Daphine Deutscher. He is getting back on track with his adjustments. He's had no untoward symptoms with regard to his band but unfortunately has had a kidney stone treated with ESWL last week.  VITAL SIGNS: Filed Vitals:   08/28/11 1059  BP: 136/88  Pulse: 68  Temp: 97.6 F (36.4 C)  Resp: 16    PHYSICAL EXAM: Physical exam reveals a very well-appearing 65 y.o.male in no apparent distress Neurologic: Awake, alert, oriented Psych: Bright affect, conversant Respiratory: Breathing even and unlabored. No stridor or wheezing Abdomen: Soft, nontender, nondistended to palpation. Incisions well-healed. No incisional hernias. Port easily palpated. Extremities: Atraumatic, good range of motion.  ASSESMENT: 66 y.o.  male  s/p AP-Large lap-band.   PLAN: The patient's port was accessed with a 20G Huber needle without difficulty. Clear fluid was aspirated and 1.5 mL saline was added to the port to give a total predicted volume of 6.5 mL. The patient was able to swallow water without difficulty following the procedure and was instructed to take clear liquids for the next 24-48 hours and advance slowly as tolerated.

## 2011-08-28 NOTE — Patient Instructions (Signed)
Take clear liquids for the next 48 hours. Thin protein shakes are ok to start on Saturday evening. You may begin foods with the consistency of yogurt, cottage cheese, cream soups, etc. on Sunday. Call us if you have persistent vomiting or regurgitation, night cough or reflux symptoms. Return as scheduled or sooner if you notice no changes in hunger/portion sizes.   

## 2011-09-07 DIAGNOSIS — N201 Calculus of ureter: Secondary | ICD-10-CM | POA: Diagnosis not present

## 2011-09-07 DIAGNOSIS — N200 Calculus of kidney: Secondary | ICD-10-CM | POA: Diagnosis not present

## 2011-10-08 ENCOUNTER — Encounter (INDEPENDENT_AMBULATORY_CARE_PROVIDER_SITE_OTHER): Payer: Medicare Other

## 2011-10-14 ENCOUNTER — Encounter (INDEPENDENT_AMBULATORY_CARE_PROVIDER_SITE_OTHER): Payer: Medicare Other | Admitting: Surgery

## 2011-10-15 ENCOUNTER — Encounter (INDEPENDENT_AMBULATORY_CARE_PROVIDER_SITE_OTHER): Payer: Medicare Other

## 2011-11-18 DIAGNOSIS — Z125 Encounter for screening for malignant neoplasm of prostate: Secondary | ICD-10-CM | POA: Diagnosis not present

## 2011-11-18 DIAGNOSIS — E789 Disorder of lipoprotein metabolism, unspecified: Secondary | ICD-10-CM | POA: Diagnosis not present

## 2011-11-18 DIAGNOSIS — I10 Essential (primary) hypertension: Secondary | ICD-10-CM | POA: Diagnosis not present

## 2011-11-18 DIAGNOSIS — E119 Type 2 diabetes mellitus without complications: Secondary | ICD-10-CM | POA: Diagnosis not present

## 2011-11-18 DIAGNOSIS — E291 Testicular hypofunction: Secondary | ICD-10-CM | POA: Diagnosis not present

## 2011-11-19 ENCOUNTER — Encounter (INDEPENDENT_AMBULATORY_CARE_PROVIDER_SITE_OTHER): Payer: Self-pay

## 2011-11-19 ENCOUNTER — Ambulatory Visit (INDEPENDENT_AMBULATORY_CARE_PROVIDER_SITE_OTHER): Payer: Medicare Other | Admitting: Physician Assistant

## 2011-11-19 NOTE — Patient Instructions (Signed)
Return in two months or sooner if needed. 

## 2011-11-19 NOTE — Progress Notes (Signed)
  HISTORY: [name] [name] is a 66 y.o.male who received an AP-Large lap-band in June 2010 by Dr. Daphine Deutscher. He comes in today without complaints of hunger or large portion sizes. He wanted to check in on his progress. Unfortunately he's gained about six pounds since February, which he attributes to eating poorly. He does not want an adjustment today as he feels like the band is tight enough.  VITAL SIGNS: Filed Vitals:   11/19/11 0851  BP: 140/86  Pulse: 64  Temp: 98.6 F (37 C)  Resp: 18    PHYSICAL EXAM: Physical exam reveals a very well-appearing 65 y.o.male in no apparent distress Neurologic: Awake, alert, oriented Psych: Bright affect, conversant Respiratory: Breathing even and unlabored. No stridor or wheezing Extremities: Atraumatic, good range of motion. Skin: Warm, Dry, no rashes Musculoskeletal: Normal gait, Joints normal  ASSESMENT: 66 y.o.  male  s/p AP-large lap-band.   PLAN: We discussed modifying eating habits. He also complained about being tired in the afternoon so he's scheduled to see his primary care physician regarding possible sleep apnea treatment (he has a previous history of same). He does not believe he needs nutrition consultation as he has a good grasp on what to eat/not to eat. He'll also consider eating smaller meals throughout the day. We'll have him return in a couple of months for a progress check.

## 2011-11-26 DIAGNOSIS — G589 Mononeuropathy, unspecified: Secondary | ICD-10-CM | POA: Diagnosis not present

## 2011-11-26 DIAGNOSIS — N400 Enlarged prostate without lower urinary tract symptoms: Secondary | ICD-10-CM | POA: Diagnosis not present

## 2011-11-26 DIAGNOSIS — I10 Essential (primary) hypertension: Secondary | ICD-10-CM | POA: Diagnosis not present

## 2011-11-26 DIAGNOSIS — L919 Hypertrophic disorder of the skin, unspecified: Secondary | ICD-10-CM | POA: Diagnosis not present

## 2011-12-02 DIAGNOSIS — G4733 Obstructive sleep apnea (adult) (pediatric): Secondary | ICD-10-CM | POA: Diagnosis not present

## 2011-12-23 DIAGNOSIS — G4733 Obstructive sleep apnea (adult) (pediatric): Secondary | ICD-10-CM | POA: Diagnosis not present

## 2011-12-23 DIAGNOSIS — R209 Unspecified disturbances of skin sensation: Secondary | ICD-10-CM | POA: Diagnosis not present

## 2011-12-30 ENCOUNTER — Other Ambulatory Visit: Payer: Self-pay | Admitting: Dermatology

## 2011-12-30 DIAGNOSIS — L980 Pyogenic granuloma: Secondary | ICD-10-CM | POA: Diagnosis not present

## 2012-01-28 DIAGNOSIS — M1710 Unilateral primary osteoarthritis, unspecified knee: Secondary | ICD-10-CM | POA: Diagnosis not present

## 2012-01-28 DIAGNOSIS — M25569 Pain in unspecified knee: Secondary | ICD-10-CM | POA: Diagnosis not present

## 2012-02-18 DIAGNOSIS — L905 Scar conditions and fibrosis of skin: Secondary | ICD-10-CM | POA: Diagnosis not present

## 2012-02-18 DIAGNOSIS — D230 Other benign neoplasm of skin of lip: Secondary | ICD-10-CM | POA: Diagnosis not present

## 2012-03-03 ENCOUNTER — Ambulatory Visit (INDEPENDENT_AMBULATORY_CARE_PROVIDER_SITE_OTHER): Payer: Medicare Other | Admitting: Physician Assistant

## 2012-03-03 ENCOUNTER — Encounter (INDEPENDENT_AMBULATORY_CARE_PROVIDER_SITE_OTHER): Payer: Self-pay

## 2012-03-03 VITALS — BP 134/70 | HR 62 | Ht 72.0 in | Wt 301.4 lb

## 2012-03-03 DIAGNOSIS — Z4651 Encounter for fitting and adjustment of gastric lap band: Secondary | ICD-10-CM

## 2012-03-03 NOTE — Patient Instructions (Signed)
Take clear liquids tonight. Thin protein shakes are ok to start tomorrow morning. Slowly advance your diet thereafter. Call us if you have persistent vomiting or regurgitation, night cough or reflux symptoms. Return as scheduled or sooner if you notice no changes in hunger/portion sizes.  

## 2012-03-03 NOTE — Progress Notes (Signed)
  HISTORY: [name] [name] is a 66 y.o.male who received an AP-Large (w/revision secondary to slip) lap-band in June 2010 by Dr. Daphine Deutscher. He comes in with increased weight, hunger and portion sizes since his last visit. He has said he's eating more slider foods recently more out of convenience. He's having more knee pain which is keeping him from exercising. He wants a fill today to get weight loss back on track. He denies persistent regurgitation or reflux.  VITAL SIGNS: Filed Vitals:   03/03/12 0935  BP: 134/70  Pulse: 62    PHYSICAL EXAM: Physical exam reveals a very well-appearing 65 y.o.male in no apparent distress Neurologic: Awake, alert, oriented Psych: Bright affect, conversant Respiratory: Breathing even and unlabored. No stridor or wheezing Abdomen: Soft, nontender, nondistended to palpation. Incisions well-healed. No incisional hernias. Port easily palpated. Extremities: Atraumatic, good range of motion.  ASSESMENT: 66 y.o.  male  s/p AP-Large lap-band.   PLAN: The patient's port was accessed with a 20G Huber needle without difficulty. Clear fluid was aspirated and 1.0 mL saline was added to the port to give a total predicted volume of 7.5 mL. The patient was able to swallow water without difficulty following the procedure and was instructed to take clear liquids for the next 24-48 hours and advance slowly as tolerated.

## 2012-03-15 DIAGNOSIS — G4733 Obstructive sleep apnea (adult) (pediatric): Secondary | ICD-10-CM | POA: Diagnosis not present

## 2012-04-14 ENCOUNTER — Encounter (INDEPENDENT_AMBULATORY_CARE_PROVIDER_SITE_OTHER): Payer: Self-pay

## 2012-04-14 ENCOUNTER — Ambulatory Visit (INDEPENDENT_AMBULATORY_CARE_PROVIDER_SITE_OTHER): Payer: Medicare Other | Admitting: Physician Assistant

## 2012-04-14 VITALS — BP 138/74 | HR 80 | Temp 98.8°F | Resp 18 | Ht 72.0 in | Wt 278.4 lb

## 2012-04-14 DIAGNOSIS — Z4651 Encounter for fitting and adjustment of gastric lap band: Secondary | ICD-10-CM

## 2012-04-14 NOTE — Patient Instructions (Signed)
Return in 3 months. Focus on good food choices as well as physical activity. Return sooner if you have an increase in hunger, portion sizes or weight. Return also for difficulty swallowing, night cough, reflux. 

## 2012-04-14 NOTE — Progress Notes (Signed)
  HISTORY: [name] [name] is a 66 y.o.male who received an AP-Large lap-band in June 2010 by Dr. Daphine Deutscher. He comes in with progressive dysphagia since his last visit. He said he was doing well but recently noticed an increase in restriction to the point that liquids are sometimes difficult to get down. He's very pleased with his 23 lb weight loss but wants to be able to eat obviously.  VITAL SIGNS: Filed Vitals:   04/14/12 0908  BP: 138/74  Pulse: 80  Temp: 98.8 F (37.1 C)  Resp: 18    PHYSICAL EXAM: Physical exam reveals a very well-appearing 65 y.o.male in no apparent distress Neurologic: Awake, alert, oriented Psych: Bright affect, conversant Respiratory: Breathing even and unlabored. No stridor or wheezing Abdomen: Soft, nontender, nondistended to palpation. Incisions well-healed. No incisional hernias. Port easily palpated. Extremities: Atraumatic, good range of motion.  ASSESMENT: 66 y.o.  male  s/p AP-Large lap-band.   PLAN: The patient's port was accessed with a 20G Huber needle without difficulty. Clear fluid was aspirated and 0.5 mL saline was removed from the port to give a total predicted volume of 7 mL. The patient was able to swallow water without difficulty following the procedure.

## 2012-04-20 DIAGNOSIS — Z87442 Personal history of urinary calculi: Secondary | ICD-10-CM | POA: Diagnosis not present

## 2012-04-20 DIAGNOSIS — N12 Tubulo-interstitial nephritis, not specified as acute or chronic: Secondary | ICD-10-CM | POA: Diagnosis not present

## 2012-04-20 DIAGNOSIS — N201 Calculus of ureter: Secondary | ICD-10-CM | POA: Diagnosis not present

## 2012-05-24 DIAGNOSIS — E789 Disorder of lipoprotein metabolism, unspecified: Secondary | ICD-10-CM | POA: Diagnosis not present

## 2012-05-27 DIAGNOSIS — N529 Male erectile dysfunction, unspecified: Secondary | ICD-10-CM | POA: Diagnosis not present

## 2012-05-27 DIAGNOSIS — N400 Enlarged prostate without lower urinary tract symptoms: Secondary | ICD-10-CM | POA: Diagnosis not present

## 2012-05-30 DIAGNOSIS — N400 Enlarged prostate without lower urinary tract symptoms: Secondary | ICD-10-CM | POA: Diagnosis not present

## 2012-05-30 DIAGNOSIS — G4730 Sleep apnea, unspecified: Secondary | ICD-10-CM | POA: Diagnosis not present

## 2012-05-31 DIAGNOSIS — G4733 Obstructive sleep apnea (adult) (pediatric): Secondary | ICD-10-CM | POA: Diagnosis not present

## 2012-06-09 ENCOUNTER — Ambulatory Visit (INDEPENDENT_AMBULATORY_CARE_PROVIDER_SITE_OTHER): Payer: Medicare Other | Admitting: Physician Assistant

## 2012-06-09 ENCOUNTER — Encounter (INDEPENDENT_AMBULATORY_CARE_PROVIDER_SITE_OTHER): Payer: Self-pay

## 2012-06-09 ENCOUNTER — Encounter (INDEPENDENT_AMBULATORY_CARE_PROVIDER_SITE_OTHER): Payer: Medicare Other

## 2012-06-09 VITALS — BP 124/80 | HR 60 | Resp 16 | Ht 72.0 in | Wt 281.0 lb

## 2012-06-09 DIAGNOSIS — Z4651 Encounter for fitting and adjustment of gastric lap band: Secondary | ICD-10-CM

## 2012-06-09 NOTE — Patient Instructions (Signed)
Return in six weeks. Focus on good food choices as well as physical activity. Return sooner if you have an increase in hunger, portion sizes or weight. Return also for difficulty swallowing, night cough, reflux.   

## 2012-06-09 NOTE — Progress Notes (Signed)
  HISTORY: [name] [name] is a 66 y.o.male who received an AP-Large lap-band in June 2010 by Dr. Daphine Deutscher. He comes in with complaints of occasional solid food dysphagia despite having some fluid removed at his last visit in October. He's requesting further fluid be removed as a holiday from the band.  VITAL SIGNS: Filed Vitals:   06/09/12 0842  BP: 124/80  Pulse: 60  Resp: 16    PHYSICAL EXAM: Physical exam reveals a very well-appearing 66 y.o.male in no apparent distress Neurologic: Awake, alert, oriented Psych: Bright affect, conversant Respiratory: Breathing even and unlabored. No stridor or wheezing Abdomen: Soft, nontender, nondistended to palpation. Incisions well-healed. No incisional hernias. Port easily palpated. Extremities: Atraumatic, good range of motion.  ASSESMENT: 66 y.o.  male  s/p AP-Large lap-band.   PLAN: The patient's port was accessed with a 20G Huber needle without difficulty. Clear fluid was aspirated and 2 mL saline was removed from the port to give a total predicted volume of 5 mL. Consumption of low-fat/low-carb foods was reinforced. We'll have him return in late January.

## 2012-06-21 DIAGNOSIS — H5200 Hypermetropia, unspecified eye: Secondary | ICD-10-CM | POA: Diagnosis not present

## 2012-06-21 DIAGNOSIS — H43819 Vitreous degeneration, unspecified eye: Secondary | ICD-10-CM | POA: Diagnosis not present

## 2012-06-21 DIAGNOSIS — H52229 Regular astigmatism, unspecified eye: Secondary | ICD-10-CM | POA: Diagnosis not present

## 2012-06-21 DIAGNOSIS — H524 Presbyopia: Secondary | ICD-10-CM | POA: Diagnosis not present

## 2012-07-12 DIAGNOSIS — N401 Enlarged prostate with lower urinary tract symptoms: Secondary | ICD-10-CM | POA: Diagnosis not present

## 2012-07-12 DIAGNOSIS — N529 Male erectile dysfunction, unspecified: Secondary | ICD-10-CM | POA: Diagnosis not present

## 2012-07-12 DIAGNOSIS — G4733 Obstructive sleep apnea (adult) (pediatric): Secondary | ICD-10-CM | POA: Diagnosis not present

## 2012-08-04 ENCOUNTER — Encounter (INDEPENDENT_AMBULATORY_CARE_PROVIDER_SITE_OTHER): Payer: Self-pay | Admitting: Physician Assistant

## 2012-08-04 ENCOUNTER — Ambulatory Visit (INDEPENDENT_AMBULATORY_CARE_PROVIDER_SITE_OTHER): Payer: Medicare Other | Admitting: Physician Assistant

## 2012-08-04 VITALS — BP 150/82 | HR 60 | Temp 97.1°F | Resp 16 | Wt 302.6 lb

## 2012-08-04 DIAGNOSIS — Z4651 Encounter for fitting and adjustment of gastric lap band: Secondary | ICD-10-CM

## 2012-08-04 NOTE — Patient Instructions (Signed)
Take clear liquids tonight. Thin protein shakes are ok to start tomorrow morning. Slowly advance your diet thereafter. Call us if you have persistent vomiting or regurgitation, night cough or reflux symptoms. Return as scheduled or sooner if you notice no changes in hunger/portion sizes.  

## 2012-08-04 NOTE — Progress Notes (Signed)
  HISTORY: FLORA RATZ is a 67 y.o.male who received an AP-Large lap-band in June 2010 by Dr. Daphine Deutscher with revisions for slips. He comes in having had 2 mL removed at the beginning of December and unfortunately has gained 21 lbs due to hunger and overeating. He denies further regurgitation. He wants a fill this morning.  VITAL SIGNS: Filed Vitals:   08/04/12 0837  BP: 150/82  Pulse: 60  Temp: 97.1 F (36.2 C)  Resp: 16    PHYSICAL EXAM: Physical exam reveals a very well-appearing 66 y.o.male in no apparent distress Neurologic: Awake, alert, oriented Psych: Bright affect, conversant Respiratory: Breathing even and unlabored. No stridor or wheezing Abdomen: Soft, nontender, nondistended to palpation. Incisions well-healed. No incisional hernias. Port easily palpated. Extremities: Atraumatic, good range of motion.  ASSESMENT: 67 y.o.  male  s/p AP-Large lap-band.   PLAN: The patient's port was accessed with a 20G Huber needle without difficulty. Clear fluid was aspirated and 1.5 mL saline was added to the port to give a total predicted volume of 6.5 mL. The patient was able to swallow water without difficulty following the procedure and was instructed to take clear liquids for the next 24-48 hours and advance slowly as tolerated.

## 2012-09-08 ENCOUNTER — Encounter (INDEPENDENT_AMBULATORY_CARE_PROVIDER_SITE_OTHER): Payer: Medicare Other

## 2013-03-30 ENCOUNTER — Ambulatory Visit (INDEPENDENT_AMBULATORY_CARE_PROVIDER_SITE_OTHER): Payer: Medicare Other | Admitting: Physician Assistant

## 2013-03-30 ENCOUNTER — Encounter (INDEPENDENT_AMBULATORY_CARE_PROVIDER_SITE_OTHER): Payer: Self-pay

## 2013-03-30 VITALS — BP 130/74 | HR 65 | Temp 98.0°F | Resp 18 | Ht 72.0 in | Wt 306.0 lb

## 2013-03-30 DIAGNOSIS — Z4651 Encounter for fitting and adjustment of gastric lap band: Secondary | ICD-10-CM

## 2013-03-30 NOTE — Progress Notes (Signed)
  HISTORY: [name] [name] is a 67 y.o.male who received an AP-Large lap-band in June 2010 with 2 revisions for slips by Dr. Daphine Deutscher. He comes in with 3.4 lbs weight gain since his last visit in January. He's within 6 lbs of his pre-op weight. He admits to eating poorly, mostly with sweet snacks between meals. 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 He wants to get back on track. He's had one recent episode of regurgitation due to fibrous dry meat. Otherwise he has no obstructive complaints.  VITAL SIGNS: Filed Vitals:   03/30/13 0858  BP: 130/74  Pulse: 65  Temp: 98 F (36.7 C)  Resp: 18    PHYSICAL EXAM: Physical exam reveals a very well-appearing 66 y.o.male in no apparent distress Neurologic: Awake, alert, oriented Psych: Bright affect, conversant Respiratory: Breathing even and unlabored. No stridor or wheezing Abdomen: Soft, nontender, nondistended to palpation. Incisions well-healed. No incisional hernias. Port easily palpated. Extremities: Atraumatic, good range of motion.  ASSESMENT: 67 y.o.  male  s/p AP-large lap-band.   PLAN: The patient's port was accessed with a 20G Huber needle without difficulty. Clear fluid was aspirated and 0.5 mL saline was added to the port to give a total predicted volume of 7 mL. The patient was able to swallow water without difficulty following the procedure and was instructed to take clear liquids for the next 24-48 hours and advance slowly as tolerated.

## 2013-03-30 NOTE — Patient Instructions (Signed)

## 2013-04-27 DIAGNOSIS — E119 Type 2 diabetes mellitus without complications: Secondary | ICD-10-CM | POA: Diagnosis not present

## 2013-04-27 DIAGNOSIS — Z125 Encounter for screening for malignant neoplasm of prostate: Secondary | ICD-10-CM | POA: Diagnosis not present

## 2013-04-27 DIAGNOSIS — E789 Disorder of lipoprotein metabolism, unspecified: Secondary | ICD-10-CM | POA: Diagnosis not present

## 2013-05-03 DIAGNOSIS — N400 Enlarged prostate without lower urinary tract symptoms: Secondary | ICD-10-CM | POA: Diagnosis not present

## 2013-05-03 DIAGNOSIS — I10 Essential (primary) hypertension: Secondary | ICD-10-CM | POA: Diagnosis not present

## 2013-05-03 DIAGNOSIS — M6281 Muscle weakness (generalized): Secondary | ICD-10-CM | POA: Diagnosis not present

## 2013-05-03 DIAGNOSIS — Z23 Encounter for immunization: Secondary | ICD-10-CM | POA: Diagnosis not present

## 2013-05-11 DIAGNOSIS — Z23 Encounter for immunization: Secondary | ICD-10-CM | POA: Diagnosis not present

## 2013-05-25 ENCOUNTER — Encounter (INDEPENDENT_AMBULATORY_CARE_PROVIDER_SITE_OTHER): Payer: Medicare Other

## 2013-06-07 DIAGNOSIS — H43819 Vitreous degeneration, unspecified eye: Secondary | ICD-10-CM | POA: Diagnosis not present

## 2013-07-07 ENCOUNTER — Encounter (INDEPENDENT_AMBULATORY_CARE_PROVIDER_SITE_OTHER): Payer: Medicare Other

## 2013-07-13 ENCOUNTER — Encounter (INDEPENDENT_AMBULATORY_CARE_PROVIDER_SITE_OTHER): Payer: Self-pay

## 2013-07-13 ENCOUNTER — Ambulatory Visit (INDEPENDENT_AMBULATORY_CARE_PROVIDER_SITE_OTHER): Payer: Medicare Other | Admitting: Physician Assistant

## 2013-07-13 VITALS — BP 126/90 | HR 80 | Temp 98.4°F | Resp 14 | Ht 72.0 in | Wt 287.2 lb

## 2013-07-13 DIAGNOSIS — Z9884 Bariatric surgery status: Secondary | ICD-10-CM

## 2013-07-13 DIAGNOSIS — G4733 Obstructive sleep apnea (adult) (pediatric): Secondary | ICD-10-CM | POA: Diagnosis not present

## 2013-07-13 DIAGNOSIS — Z0000 Encounter for general adult medical examination without abnormal findings: Secondary | ICD-10-CM | POA: Diagnosis not present

## 2013-07-13 NOTE — Progress Notes (Signed)
  HISTORY: [name] [name] is a 68 y.o.male who received an AP-Large lap-band in June 2010 with two revisions for slips by Dr. Hassell Done. He comes in with close to 20 lbs weight loss since his last visit in September. He has occasional bouts of regurgitation when he doesn't chew adequately or if he eats too quickly. He continues to work to avoid these instances. Otherwise he's very pleased with his current fill volume as he isn't hungry and his portion sizes remain small.  VITAL SIGNS: Filed Vitals:   07/13/13 1119  BP: 126/90  Pulse: 80  Temp: 98.4 F (36.9 C)  Resp: 14    PHYSICAL EXAM: Physical exam reveals a very well-appearing 67 y.o.male in no apparent distress Neurologic: Awake, alert, oriented Psych: Bright affect, conversant Respiratory: Breathing even and unlabored. No stridor or wheezing Extremities: Atraumatic, good range of motion. Skin: Warm, Dry, no rashes Musculoskeletal: Normal gait, Joints normal  ASSESMENT: 68 y.o.  male  s/p AP-Large lap-band.   PLAN: As he's in the green zone, we deferred a fill today. I asked him to return to see [place] if he has persistent regurgitation or reflux. We have set him up to see Dr. Hassell Done for recurrent hemorrhoids which are again bothering him.

## 2013-07-13 NOTE — Patient Instructions (Signed)
Return to see Dr. Hassell Done. Focus on good food choices as well as physical activity. Be mindful of bite sizes and chewing adequately. Return sooner if you have an increase in hunger, portion sizes or weight. Return also for difficulty swallowing, night cough, reflux.

## 2013-07-24 DIAGNOSIS — I10 Essential (primary) hypertension: Secondary | ICD-10-CM | POA: Diagnosis not present

## 2013-07-24 DIAGNOSIS — R7309 Other abnormal glucose: Secondary | ICD-10-CM | POA: Diagnosis not present

## 2013-07-24 DIAGNOSIS — E789 Disorder of lipoprotein metabolism, unspecified: Secondary | ICD-10-CM | POA: Diagnosis not present

## 2013-07-24 DIAGNOSIS — Z79899 Other long term (current) drug therapy: Secondary | ICD-10-CM | POA: Diagnosis not present

## 2013-07-26 ENCOUNTER — Ambulatory Visit (INDEPENDENT_AMBULATORY_CARE_PROVIDER_SITE_OTHER): Payer: Medicare Other | Admitting: Surgery

## 2013-07-26 ENCOUNTER — Encounter (INDEPENDENT_AMBULATORY_CARE_PROVIDER_SITE_OTHER): Payer: Self-pay | Admitting: Surgery

## 2013-07-26 VITALS — BP 154/98 | HR 66 | Temp 97.2°F | Resp 18 | Ht 72.0 in | Wt 291.4 lb

## 2013-07-26 DIAGNOSIS — K649 Unspecified hemorrhoids: Secondary | ICD-10-CM

## 2013-07-26 NOTE — Progress Notes (Signed)
Chief Complaint:  Large prolapsed hemorrhoid with hygiene issues  History of Present Illness:  [name] [name] is an 68 y.o. 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  He has been having a lot of problems with anal hygiene and is hemorrhoids. On examination he has about a 2 cm long prolapsed hemorrhoid that has probably been chronically thrombosed and now represents for hygiene problem as it comes out of states that after a bowel movement. It would be difficult to clean. He was to go ahead and have this removed along with exam under anesthesia. A look for other skin tags or things that I could do to try to improve his anal hygiene.  Past Medical History  Diagnosis Date  . Difficulty urinating   . Abdominal pain   . Hypertension     resolved with lap band 2 years ago  . GERD (gastroesophageal reflux disease)     resolved with lap band 2 years ago    Past Surgical History  Procedure Laterality Date  . Gastric restriction surgery  12/11/2008    lap band  . Laparoscopic gastric banding  12/11/2008  . Cystoscopy w/ ureteral stent placement  07/18/2011    Procedure: CYSTOSCOPY WITH STENT REPLACEMENT;  Surgeon: Alexis Frock;  Location: [facility];  Service: Urology;  Laterality: Left;  . Colonoscopy  07/17/11    Current Outpatient Prescriptions  Medication Sig Dispense Refill  . alfuzosin (UROXATRAL) 10 MG 24 hr tablet Take 10 mg by mouth daily.       Marland Kitchen amLODipine (NORVASC) 10 MG tablet Take 10 mg by mouth daily.       Marland Kitchen aspirin 325 MG EC tablet Take 325 mg by mouth daily.      . Calcium Carb-Ergocalciferol (CHEWABLE CALCIUM/D PO) Take by mouth.        . docusate sodium (COLACE) 100 MG capsule Take 100 mg by mouth 2 (two) times daily.      . furosemide (LASIX) 40 MG tablet Take 40 mg by mouth daily.       . Pediatric Multiple Vitamins (CHEWABLE MULTIPLE VITAMINS PO) Take by mouth.        . tadalafil (CIALIS) 5 MG tablet Take 5 mg by mouth daily as needed for erectile  dysfunction.      . Tamsulosin HCl (FLOMAX) 0.4 MG CAPS Take 1 capsule (0.4 mg total) by mouth at bedtime.  7 capsule  0  . Telmisartan (MICARDIS PO) Take 80 mg by mouth once.        No current facility-administered medications for this visit.   Review of patient's allergies indicates no known allergies. Family History  Problem Relation Age of Onset  . Cancer Mother     ovarian  . Other Father     MVA   Social History:   reports that he quit smoking about 55 years ago. His smoking use included Cigarettes. He has a 6 pack-year smoking history. He quit smokeless tobacco use about 43 years ago. He reports that he does not drink alcohol or use illicit drugs.   REVIEW OF SYSTEMS - PERTINENT POSITIVES ONLY: Positive for lap band weight loss. He has had a prior band slip that required revision surgery.  Physical Exam:   Blood pressure 154/98, pulse 66, temperature 97.2 F (36.2 C), temperature source Temporal, resp. rate 18, height 6' (1.829 m), weight 291 lb 6.4 oz (132.178 kg). Body mass index is 39.51 kg/(m^2).  Gen:  WDWN white male NAD  Neurological: Alert and oriented to person, place, and time. Motor and sensory function is grossly intact  Head: Normocephalic and atraumatic.  Eyes: Conjunctivae are normal. Pupils are equal, round, and reactive to light. No scleral icterus.  Neck: Normal range of motion. Neck supple. No tracheal deviation or thyromegaly present.  Cardiovascular:  SR without murmurs or gallops.  No carotid bruits Respiratory: Effort normal.  No respiratory distress. No chest wall tenderness. Breath sounds normal.  No wheezes, rales or rhonchi.  Abdomen:  Somewhat protuberant GU:  Rectal reveals a 2 cm long chronically thrombosed hemorrhoid hanging out of his bottom. The stalk seems to emanate above or at the dentate line. Musculoskeletal: Normal range of motion. Extremities are nontender. No cyanosis, edema or clubbing noted Lymphadenopathy: No cervical,  preauricular, postauricular or axillary adenopathy is present Skin: Skin is warm and dry. No rash noted. No diaphoresis. No erythema. No pallor. Pscyh: Normal mood and affect. Behavior is normal. Judgment and thought content normal.   LABORATORY RESULTS: No results found for this or any previous visit (from the past 48 hour(s)).  RADIOLOGY RESULTS: No results found.  Problem List: Patient Active Problem List   Diagnosis Date Noted  . Left Ureteral stone 07/18/2011  . Pyelonephritis 07/18/2011  . Anal fissure 06/17/2011  . History of laparoscopic adjustable gastric banding 06/17/2011    Assessment & Plan: Hemorrhoidal problems with hygiene and for pruritus. Plan exam under exam anesthesia and hemorrhoidectomy    [name], MD, FACS  [facility] [phone] beeper [phone]  07/26/2013 10:01 AM     

## 2013-07-26 NOTE — Progress Notes (Signed)
Dr. Hassell Done - Please enter preop orders in Epic for [place] - he is coming to [facility] Monday 1/26 for his preop and labs.  Thanks.

## 2013-07-26 NOTE — Patient Instructions (Signed)
Hemorrhoidectomy Hemorrhoidectomy is surgery to remove hemorrhoids. Hemorrhoids are veins that have become swollen in the rectum. The rectum is the area from the bottom end of the intestines to the opening where bowel movements leave the body. Hemorrhoids can be uncomfortable. They can cause itching, bleeding and pain if a blood clot forms in them (thrombose). If hemorrhoids are small, surgery may not be needed. But if they cover a larger area, surgery is usually suggested.  LET YOUR CAREGIVER KNOW ABOUT:   Any allergies.  All medications you are taking, including:  Herbs, eyedrops, over-the-counter medications and creams.  Blood thinners (anticoagulants), aspirin or other drugs that could affect blood clotting.  Use of steroids (by mouth or as creams).  Previous problems with anesthetics, including local anesthetics.  Possibility of pregnancy, if this applies.  Any history of blood clots.  Any history of bleeding or other blood problems.  Previous surgery.  Smoking history.  Other health problems. RISKS AND COMPLICATIONS All surgery carries some risk. However, hemorrhoid surgery usually goes smoothly. Possible complications could include:  Urinary retention.  Bleeding.  Infection.  A painful incision.  A reaction to the anesthesia (this is not common). BEFORE THE PROCEDURE   Stop using aspirin and non-steroidal anti-inflammatory drugs (NSAIDs) for pain relief. This includes prescription drugs and over-the-counter drugs such as ibuprofen and naproxen. Also stop taking vitamin E. If possible, do this two weeks before your surgery.  If you take blood-thinners, ask your healthcare provider when you should stop taking them.  You will probably have blood and urine tests done several days before your surgery.  Do not eat or drink for about 8 hours before the surgery.  Arrive at least an hour before the surgery, or whenever your surgeon recommends. This will give you time to  check in and fill out any needed paperwork.  Hemorrhoidectomy is often an outpatient procedure. This means you will be able to go home the same day. Sometimes, though, people stay overnight in the hospital after the procedure. Ask your surgeon what to expect. Either way, make arrangements in advance for someone to drive you home. PROCEDURE   The preparation:  You will change into a hospital gown.  You will be given an IV. A needle will be inserted in your arm. Medication can flow directly into your body through this needle.  You might be given an enema to clear your rectum.  Once in the operating room, you will probably lie on your side or be repositioned later to lying on your stomach.  You will be given anesthesia (medication) so you will not feel anything during the surgery. The surgery often is done with local anesthesia (the area near the hemorrhoids will be numb and you will be drowsy but awake). Sometimes, general anesthesia is used (you will be asleep during the procedure).  The procedure:  There are a few different procedures for hemorrhoids. Be sure to ask you surgeon about the procedure, the risks and benefits.  Be sure to ask about what you need to do to take care of the wound, if there is one. AFTER THE PROCEDURE  You will stay in a recovery area until the anesthesia has worn off. Your blood pressure and pulse will be checked every so often.  You may feel a lot of pain in the area of the rectum.  Take all pain medication prescribed by your surgeon. Ask before taking any over-the-counter pain medicines.  Sometimes sitting in a warm bath can help relieve  your pain.  To make sure you have bowel movements without straining:  You will probably need to take stool softeners (usually a pill) for a few days.  You should drink 8 to 10 glasses of water each day.  Your activity will be restricted for awhile. Ask your caregiver for a list of what you should and should not do  while you recover. Document Released: 04/19/2009 Document Revised: 09/14/2011 Document Reviewed: 04/19/2009 [facility] Patient Information 2014 [place], [place].

## 2013-07-27 ENCOUNTER — Encounter (HOSPITAL_COMMUNITY): Payer: Self-pay | Admitting: Pharmacy Technician

## 2013-07-28 DIAGNOSIS — K219 Gastro-esophageal reflux disease without esophagitis: Secondary | ICD-10-CM | POA: Diagnosis not present

## 2013-07-28 DIAGNOSIS — N401 Enlarged prostate with lower urinary tract symptoms: Secondary | ICD-10-CM | POA: Diagnosis not present

## 2013-07-28 DIAGNOSIS — I10 Essential (primary) hypertension: Secondary | ICD-10-CM | POA: Diagnosis not present

## 2013-07-28 DIAGNOSIS — N138 Other obstructive and reflux uropathy: Secondary | ICD-10-CM | POA: Diagnosis not present

## 2013-07-28 DIAGNOSIS — D649 Anemia, unspecified: Secondary | ICD-10-CM | POA: Diagnosis not present

## 2013-07-31 ENCOUNTER — Encounter (HOSPITAL_COMMUNITY)
Admission: RE | Admit: 2013-07-31 | Discharge: 2013-07-31 | Disposition: A | Discharge status: Home / Self Care | Payer: Medicare Other | Source: Ambulatory Visit | Attending: Surgery | Admitting: Surgery

## 2013-07-31 ENCOUNTER — Ambulatory Visit (HOSPITAL_COMMUNITY)
Admission: RE | Admit: 2013-07-31 | Discharge: 2013-07-31 | Disposition: A | Discharge status: Home / Self Care | Payer: Medicare Other | Source: Ambulatory Visit | Attending: Surgery | Admitting: Surgery

## 2013-07-31 ENCOUNTER — Encounter (HOSPITAL_COMMUNITY): Payer: Self-pay

## 2013-07-31 DIAGNOSIS — Z01812 Encounter for preprocedural laboratory examination: Secondary | ICD-10-CM | POA: Diagnosis not present

## 2013-07-31 DIAGNOSIS — I10 Essential (primary) hypertension: Secondary | ICD-10-CM | POA: Diagnosis not present

## 2013-07-31 DIAGNOSIS — K649 Unspecified hemorrhoids: Secondary | ICD-10-CM | POA: Diagnosis not present

## 2013-07-31 DIAGNOSIS — Z01818 Encounter for other preprocedural examination: Secondary | ICD-10-CM | POA: Insufficient documentation

## 2013-07-31 DIAGNOSIS — D649 Anemia, unspecified: Secondary | ICD-10-CM | POA: Diagnosis not present

## 2013-07-31 DIAGNOSIS — Z01810 Encounter for preprocedural cardiovascular examination: Secondary | ICD-10-CM | POA: Insufficient documentation

## 2013-07-31 HISTORY — DX: Personal history of other endocrine, nutritional and metabolic disease: Z86.39

## 2013-07-31 HISTORY — DX: Sleep apnea, unspecified: G47.30

## 2013-07-31 LAB — BASIC METABOLIC PANEL
BUN: 13 mg/dL (ref 6–23)
CO2: 28 meq/L (ref 19–32)
Calcium: 8.6 mg/dL (ref 8.4–10.5)
Chloride: 103 mEq/L (ref 96–112)
Creatinine, Ser: 0.73 mg/dL (ref 0.50–1.35)
GFR calc Af Amer: >90 mL/min (ref >90)
GLUCOSE: 117 mg/dL — AB (ref 70–99)
POTASSIUM: 4.3 meq/L (ref 3.7–5.3)
SODIUM: 143 meq/L (ref 137–147)

## 2013-07-31 LAB — CBC
HCT: 39.6 % (ref 39.0–52.0)
HEMOGLOBIN: 13.4 g/dL (ref 13.0–17.0)
MCH: 31.8 pg (ref 26.0–34.0)
MCHC: 33.8 g/dL (ref 30.0–36.0)
MCV: 93.8 fL (ref 78.0–100.0)
PLATELETS: 174 10*3/uL (ref 150–400)
RBC: 4.22 MIL/uL (ref 4.22–5.81)
RDW: 12.6 % (ref 11.5–15.5)
WBC: 6.2 10*3/uL (ref 4.0–10.5)

## 2013-07-31 NOTE — Pre-Procedure Instructions (Addendum)
CBC, BMET, EKG AND CXR WERE DONE TODAY - PREOP AT [facility] - AS OER ANESTHESIOLOGIST'S GUIDELINES.

## 2013-07-31 NOTE — Patient Instructions (Addendum)
YOUR SURGERY IS SCHEDULED AT [facility]  ON:  Friday  1/30  REPORT TO  SHORT STAY CENTER AT:  10:15 AM      PHONE # FOR SHORT STAY IS [phone]  FOLLOW ANY RECTAL PREP INSTRUCTIONS FROM DR. MARTIN'S OFFICE.  DO NOT EAT  ANYTHING AFTER MIDNIGHT THE NIGHT BEFORE YOUR SURGERY.  YOU MAY BRUSH YOUR TEETH, RINSE OUT YOUR MOUTH--BUT  NO FOOD, NO CHEWING GUM, NO MINTS, NO CANDIES, NO CHEWING TOBACCO.  YOU MAY HAVE CLEAR LIQUIDS TO DRINK FROM MIDNIGHT UNTIL 6:00 AM THE DAY OF SURGERY   - LIKE WATER.  -- NOTHING TO DRINK AFTER 6:00 AM  PLEASE TAKE THE FOLLOWING MEDICATIONS THE AM OF YOUR SURGERY WITH A FEW SIPS OF WATER:  TAKE UROXATRAL AND AMLODIPINE   IF YOU HAVE SLEEP APNEA AND USE CPAP OR BIPAP--PLEASE BRING THE MASK AND THE TUBING.  DO NOT BRING YOUR MACHINE.  DO NOT BRING VALUABLES, MONEY, CREDIT CARDS.  DO NOT WEAR JEWELRY, MAKE-UP, NAIL POLISH AND NO METAL PINS OR CLIPS IN YOUR HAIR. CONTACT LENS, DENTURES / PARTIALS, GLASSES SHOULD NOT BE WORN TO SURGERY AND IN MOST CASES-HEARING AIDS WILL NEED TO BE REMOVED.  BRING YOUR GLASSES CASE, ANY EQUIPMENT NEEDED FOR YOUR CONTACT LENS. FOR PATIENTS ADMITTED TO THE HOSPITAL--CHECK OUT TIME THE DAY OF DISCHARGE IS 11:00 AM.  ALL INPATIENT ROOMS ARE PRIVATE - WITH BATHROOM, TELEPHONE, TELEVISION AND WIFI INTERNET.  IF YOU ARE BEING DISCHARGED THE SAME DAY OF YOUR SURGERY--YOU CAN NOT DRIVE YOURSELF HOME--AND SHOULD NOT GO HOME ALONE BY TAXI OR BUS.  NO DRIVING OR OPERATING MACHINERY, DO NOT MAKE LEGAL DECISIONS FOR 24 HOURS FOLLOWING ANESTHESIA / PAIN MEDICATIONS.  PLEASE MAKE ARRANGEMENTS FOR SOMEONE TO BE WITH YOU AT HOME THE FIRST 24 HOURS AFTER SURGERY. RESPONSIBLE DRIVER'S NAME / PHONE    WIFE [name]  [phone]                                                    FAILURE TO FOLLOW THESE INSTRUCTIONS MAY RESULT IN THE CANCELLATION OF YOUR SURGERY. PLEASE BE AWARE THAT YOU MAY NEED ADDITIONAL BLOOD DRAWN DAY OF YOUR SURGERY  PATIENT  SIGNATURE_________________________________

## 2013-08-03 ENCOUNTER — Other Ambulatory Visit (INDEPENDENT_AMBULATORY_CARE_PROVIDER_SITE_OTHER): Payer: Self-pay | Admitting: Surgery

## 2013-08-03 NOTE — Progress Notes (Signed)
Dr. Hassell Done - Please enter preop orders in Epic for [facility].  His surgery is tomorrow - Fri 1/30.  His preop appt at [facility] was 07-31-13 - cbc, bmet, ekg, cxr were done at that time as per anesthesiologist's guidelines.

## 2013-08-04 ENCOUNTER — Encounter (HOSPITAL_COMMUNITY): Payer: Self-pay | Admitting: *Deleted

## 2013-08-04 ENCOUNTER — Ambulatory Visit (HOSPITAL_COMMUNITY)
Admission: RE | Admit: 2013-08-04 | Discharge: 2013-08-04 | Disposition: A | Discharge status: Home / Self Care | Payer: Medicare Other | Source: Ambulatory Visit | Attending: Surgery | Admitting: Surgery

## 2013-08-04 ENCOUNTER — Encounter (HOSPITAL_COMMUNITY)
Admission: RE | Disposition: A | Discharge status: Home / Self Care | Payer: Self-pay | Source: Ambulatory Visit | Attending: Surgery

## 2013-08-04 ENCOUNTER — Ambulatory Visit (HOSPITAL_COMMUNITY): Payer: Medicare Other | Admitting: Anesthesiology

## 2013-08-04 ENCOUNTER — Encounter (HOSPITAL_COMMUNITY): Payer: Medicare Other | Admitting: Anesthesiology

## 2013-08-04 DIAGNOSIS — K648 Other hemorrhoids: Secondary | ICD-10-CM | POA: Diagnosis not present

## 2013-08-04 DIAGNOSIS — K649 Unspecified hemorrhoids: Secondary | ICD-10-CM | POA: Diagnosis not present

## 2013-08-04 DIAGNOSIS — N12 Tubulo-interstitial nephritis, not specified as acute or chronic: Secondary | ICD-10-CM | POA: Diagnosis not present

## 2013-08-04 DIAGNOSIS — K602 Anal fissure, unspecified: Secondary | ICD-10-CM | POA: Insufficient documentation

## 2013-08-04 DIAGNOSIS — I10 Essential (primary) hypertension: Secondary | ICD-10-CM | POA: Insufficient documentation

## 2013-08-04 DIAGNOSIS — Z9884 Bariatric surgery status: Secondary | ICD-10-CM | POA: Insufficient documentation

## 2013-08-04 DIAGNOSIS — Z87891 Personal history of nicotine dependence: Secondary | ICD-10-CM | POA: Insufficient documentation

## 2013-08-04 DIAGNOSIS — L290 Pruritus ani: Secondary | ICD-10-CM | POA: Insufficient documentation

## 2013-08-04 DIAGNOSIS — N201 Calculus of ureter: Secondary | ICD-10-CM | POA: Diagnosis not present

## 2013-08-04 DIAGNOSIS — K644 Residual hemorrhoidal skin tags: Secondary | ICD-10-CM | POA: Insufficient documentation

## 2013-08-04 DIAGNOSIS — Z79899 Other long term (current) drug therapy: Secondary | ICD-10-CM | POA: Insufficient documentation

## 2013-08-04 DIAGNOSIS — K219 Gastro-esophageal reflux disease without esophagitis: Secondary | ICD-10-CM | POA: Insufficient documentation

## 2013-08-04 DIAGNOSIS — R300 Dysuria: Secondary | ICD-10-CM | POA: Insufficient documentation

## 2013-08-04 HISTORY — PX: HEMORRHOID SURGERY: SHX153

## 2013-08-04 SURGERY — HEMORRHOIDECTOMY PROLAPSED
Anesthesia: General | Site: Rectum

## 2013-08-04 MED ORDER — MIDAZOLAM HCL 5 MG/5ML IJ SOLN
INTRAMUSCULAR | Status: DC | PRN
  Administered 2013-08-04: 2 mg via INTRAVENOUS

## 2013-08-04 MED ORDER — ACETAMINOPHEN 650 MG RE SUPP
650.0000 mg | RECTAL | Status: DC | PRN
  Filled 2013-08-04: qty 1

## 2013-08-04 MED ORDER — HEPARIN SODIUM (PORCINE) 5000 UNIT/ML IJ SOLN
5000.0000 [IU] | Freq: Once | INTRAMUSCULAR | Status: AC
  Administered 2013-08-04: 5000 [IU] via SUBCUTANEOUS
  Filled 2013-08-04: qty 1

## 2013-08-04 MED ORDER — METOCLOPRAMIDE HCL 5 MG/ML IJ SOLN
INTRAMUSCULAR | Status: AC
  Filled 2013-08-04: qty 2

## 2013-08-04 MED ORDER — PROMETHAZINE HCL 25 MG/ML IJ SOLN: 6.2500 mg | INTRAMUSCULAR | Status: DC | PRN

## 2013-08-04 MED ORDER — METOCLOPRAMIDE HCL 5 MG/ML IJ SOLN
INTRAMUSCULAR | Status: DC | PRN
  Administered 2013-08-04: 10 mg via INTRAVENOUS

## 2013-08-04 MED ORDER — MEPERIDINE HCL 50 MG/ML IJ SOLN: 6.2500 mg | INTRAMUSCULAR | Status: DC | PRN

## 2013-08-04 MED ORDER — 0.9 % SODIUM CHLORIDE (POUR BTL) OPTIME
TOPICAL | Status: DC | PRN
  Administered 2013-08-04: 1000 mL

## 2013-08-04 MED ORDER — PROPOFOL 10 MG/ML IV BOLUS
INTRAVENOUS | Status: DC | PRN
  Administered 2013-08-04: 200 mg via INTRAVENOUS

## 2013-08-04 MED ORDER — MIDAZOLAM HCL 2 MG/2ML IJ SOLN
INTRAMUSCULAR | Status: AC
  Filled 2013-08-04: qty 2

## 2013-08-04 MED ORDER — ONDANSETRON HCL 4 MG/2ML IJ SOLN: 4.0000 mg | Freq: Four times a day (QID) | INTRAMUSCULAR | Status: DC | PRN

## 2013-08-04 MED ORDER — OXYCODONE HCL 5 MG PO TABS: 5.0000 mg | ORAL_TABLET | ORAL | Status: DC | PRN

## 2013-08-04 MED ORDER — SODIUM CHLORIDE 0.9 % IV SOLN: 250.0000 mL | INTRAVENOUS | Status: DC | PRN

## 2013-08-04 MED ORDER — FENTANYL CITRATE 0.05 MG/ML IJ SOLN
INTRAMUSCULAR | Status: DC | PRN
  Administered 2013-08-04: 100 ug via INTRAVENOUS
  Administered 2013-08-04: 50 ug via INTRAVENOUS

## 2013-08-04 MED ORDER — LACTATED RINGERS IV SOLN
INTRAVENOUS | Status: DC | PRN
  Administered 2013-08-04: 12:00:00 via INTRAVENOUS

## 2013-08-04 MED ORDER — BUPIVACAINE LIPOSOME 1.3 % IJ SUSP
20.0000 mL | Freq: Once | INTRAMUSCULAR | Status: AC
  Administered 2013-08-04: 20 mL
  Filled 2013-08-04: qty 20

## 2013-08-04 MED ORDER — FENTANYL CITRATE 0.05 MG/ML IJ SOLN
INTRAMUSCULAR | Status: AC
Start: 2013-08-04 — End: 2013-08-04
  Filled 2013-08-04: qty 5

## 2013-08-04 MED ORDER — ACETAMINOPHEN 325 MG PO TABS: 650.0000 mg | ORAL_TABLET | ORAL | Status: DC | PRN

## 2013-08-04 MED ORDER — OXYCODONE HCL 5 MG PO TABS: 5.0000 mg | ORAL_TABLET | Freq: Once | ORAL | Status: DC | PRN

## 2013-08-04 MED ORDER — HYDROCODONE-ACETAMINOPHEN 7.5-325 MG/15ML PO SOLN
15.0000 mL | Freq: Four times a day (QID) | ORAL | Status: DC | PRN
Start: 2013-08-04 — End: 2014-01-04

## 2013-08-04 MED ORDER — ONDANSETRON HCL 4 MG/2ML IJ SOLN
INTRAMUSCULAR | Status: DC | PRN
  Administered 2013-08-04: 4 mg via INTRAVENOUS

## 2013-08-04 MED ORDER — SUCCINYLCHOLINE CHLORIDE 20 MG/ML IJ SOLN
INTRAMUSCULAR | Status: DC | PRN
  Administered 2013-08-04: 100 mg via INTRAVENOUS

## 2013-08-04 MED ORDER — FLEET ENEMA 7-19 GM/118ML RE ENEM
1.0000 | ENEMA | Freq: Once | RECTAL | Status: AC
  Administered 2013-08-04: 1 via RECTAL
  Filled 2013-08-04: qty 1

## 2013-08-04 MED ORDER — DEXAMETHASONE SODIUM PHOSPHATE 4 MG/ML IJ SOLN
INTRAMUSCULAR | Status: DC | PRN
  Administered 2013-08-04: 10 mg via INTRAVENOUS

## 2013-08-04 MED ORDER — ONDANSETRON HCL 4 MG/2ML IJ SOLN
INTRAMUSCULAR | Status: AC
  Filled 2013-08-04: qty 2

## 2013-08-04 MED ORDER — LACTATED RINGERS IV SOLN
INTRAVENOUS | Status: DC
Start: 2013-08-04 — End: 2013-08-04
  Administered 2013-08-04: 14:00:00 via INTRAVENOUS

## 2013-08-04 MED ORDER — OXYCODONE HCL 5 MG/5ML PO SOLN
5.0000 mg | Freq: Once | ORAL | Status: DC | PRN
  Filled 2013-08-04: qty 5

## 2013-08-04 MED ORDER — PROPOFOL 10 MG/ML IV BOLUS
INTRAVENOUS | Status: AC
  Filled 2013-08-04: qty 20

## 2013-08-04 MED ORDER — HYDROMORPHONE HCL PF 1 MG/ML IJ SOLN: 0.2500 mg | INTRAMUSCULAR | Status: DC | PRN

## 2013-08-04 MED ORDER — SODIUM CHLORIDE 0.9 % IJ SOLN: 3.0000 mL | Freq: Two times a day (BID) | INTRAMUSCULAR | Status: DC

## 2013-08-04 MED ORDER — SODIUM CHLORIDE 0.9 % IJ SOLN: 3.0000 mL | INTRAMUSCULAR | Status: DC | PRN

## 2013-08-04 MED ORDER — POVIDONE-IODINE 10 % EX OINT
TOPICAL_OINTMENT | CUTANEOUS | Status: AC
  Filled 2013-08-04: qty 28.35

## 2013-08-04 MED ORDER — DEXAMETHASONE SODIUM PHOSPHATE 10 MG/ML IJ SOLN
INTRAMUSCULAR | Status: AC
  Filled 2013-08-04: qty 1

## 2013-08-04 SURGICAL SUPPLY — 34 items
BLADE EXTENDED COATED 6.5IN (ELECTRODE) IMPLANT
BLADE HEX COATED 2.75 (ELECTRODE) ×3 IMPLANT
BRIEF STRETCH FOR OB PAD LRG (UNDERPADS AND DIAPERS) ×2 IMPLANT
DECANTER SPIKE VIAL GLASS SM (MISCELLANEOUS) ×1 IMPLANT
DRAPE LAPAROTOMY T 102X78X121 (DRAPES) ×3 IMPLANT
ELECT REM PT RETURN 9FT ADLT (ELECTROSURGICAL) ×3
ELECTRODE REM PT RTRN 9FT ADLT (ELECTROSURGICAL) ×1 IMPLANT
GAUZE SPONGE 4X4 16PLY XRAY LF (GAUZE/BANDAGES/DRESSINGS) ×3 IMPLANT
GLOVE BIOGEL M 8.0 STRL (GLOVE) ×3 IMPLANT
GLOVE BIOGEL PI IND STRL 7.0 (GLOVE) ×1 IMPLANT
GLOVE BIOGEL PI IND STRL 8.5 (GLOVE) IMPLANT
GLOVE BIOGEL PI INDICATOR 7.0 (GLOVE)
GLOVE BIOGEL PI INDICATOR 8.5 (GLOVE) ×4
GOWN STRL REIN 2XL XLG LVL4 (GOWN DISPOSABLE) ×2 IMPLANT
GOWN STRL REUS W/TWL LRG LVL3 (GOWN DISPOSABLE) ×1 IMPLANT
GOWN STRL REUS W/TWL XL LVL3 (GOWN DISPOSABLE) ×4 IMPLANT
NDL HYPO 25X1 1.5 SAFETY (NEEDLE) ×1 IMPLANT
NEEDLE HYPO 22GX1.5 SAFETY (NEEDLE) ×3 IMPLANT
NEEDLE HYPO 25X1 1.5 SAFETY (NEEDLE) IMPLANT
NS IRRIG 1000ML POUR BTL (IV SOLUTION) ×3 IMPLANT
PACK BASIC VI WITH GOWN DISP (CUSTOM PROCEDURE TRAY) ×3 IMPLANT
PAD ABD 8X10 STRL (GAUZE/BANDAGES/DRESSINGS) IMPLANT
PENCIL BUTTON HOLSTER BLD 10FT (ELECTRODE) ×3 IMPLANT
SPONGE GAUZE 4X4 12PLY (GAUZE/BANDAGES/DRESSINGS) ×3 IMPLANT
SPONGE SURGIFOAM ABS GEL 100 (HEMOSTASIS) IMPLANT
SPONGE SURGIFOAM ABS GEL 12-7 (HEMOSTASIS) IMPLANT
STAPLER PROXIMATE HCS (STAPLE) IMPLANT
STAPLER VISISTAT 35W (STAPLE) ×1 IMPLANT
SUT CHROMIC 2 0 SH (SUTURE) ×2 IMPLANT
SUT PROLENE 2 0 BLUE (SUTURE) ×1 IMPLANT
SUT VIC AB 4-0 SH 18 (SUTURE) IMPLANT
SYR CONTROL 10ML LL (SYRINGE) ×1 IMPLANT
TAPE CLOTH 4X10 WHT NS (GAUZE/BANDAGES/DRESSINGS) IMPLANT
YANKAUER SUCT BULB TIP 10FT TU (MISCELLANEOUS) ×3 IMPLANT

## 2013-08-04 NOTE — Preoperative (Signed)
Beta Blockers   Reason not to administer Beta Blockers:Not Applicable, not on home BB 

## 2013-08-04 NOTE — Discharge Instructions (Signed)
Sitz Bath °A sitz bath is a warm water bath taken in the sitting position that covers only the hips and buttocks. It may be used for either healing or hygiene purposes. Sitz baths are also used to relieve pain, itching, or muscle spasms. The water may contain medicine. Moist heat will help you heal and relax.  °HOME CARE INSTRUCTIONS  °Take 3 to 4 sitz baths a day. °1. Fill the bathtub half full with warm water. °2. Sit in the water and open the drain a little. °3. Turn on the warm water to keep the tub half full. Keep the water running constantly. °4. Soak in the water for 15 to 20 minutes. °5. After the sitz bath, pat the affected area dry first. °SEEK MEDICAL CARE IF:  °You get worse instead of better. Stop the sitz baths if you get worse. °MAKE SURE YOU: °· Understand these instructions. °· Will watch your condition. °· Will get help right away if you are not doing well or get worse. °Document Released: 03/14/2004 Document Revised: 03/16/2012 Document Reviewed: 09/19/2010 °ExitCare® Patient Information ©2014 [employer]. ° °

## 2013-08-04 NOTE — Op Note (Signed)
Surgeon: Kaylyn Lim, MD, FACS  Asst:  none  Anes:  general  Procedure: EUA, excision of 2 inch internal chronic hemorrhoid  Diagnosis: Pruritus ani and prolapsed internal tag  Complications: none  EBL:   minimal cc  Description of Procedure:  With the patient in dorsal lithotomy and after prepping with PCMX and draping sterilely, a timeout was performed.  Exam in dorsal lithotomy revealed a 2 inch long prolapsed hemorrhoid arising at the dentate line at the 7 oclock position.  This was clamped at the base and excised in toto and the base was closed with running 3-0 chromic.  At 12 oclock there was another small area of potential hygiene compromise that was excised with the Bovie.  No other hemorrhoid disease warranted excision .  The perianal region was injected with 20 cc Exparel.    [name] [name] Done, [place], [facility], [place]

## 2013-08-04 NOTE — Interval H&P Note (Signed)
History and Physical Interval Note:  08/04/2013 12:09 PM  [name] [name]  has presented today for surgery, with the diagnosis of pruritus from chronic prolapsed hemorrhoids  The various methods of treatment have been discussed with the patient and family. After consideration of risks, benefits and other options for treatment, the patient has consented to  Procedure(s): EUA,HEMORRHOIDECTOMY (N/A) as a surgical intervention .  The patient's history has been reviewed, patient examined, no change in status, stable for surgery.  I have reviewed the patient's chart and labs.  Questions were answered to the patient's satisfaction.     [name] B

## 2013-08-04 NOTE — H&P (View-Only) (Signed)
Chief Complaint:  Large prolapsed hemorrhoid with hygiene issues  History of Present Illness:  [name] [name] is an 68 y.o. 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  He has been having a lot of problems with anal hygiene and is hemorrhoids. On examination he has about a 2 cm long prolapsed hemorrhoid that has probably been chronically thrombosed and now represents for hygiene problem as it comes out of states that after a bowel movement. It would be difficult to clean. He was to go ahead and have this removed along with exam under anesthesia. A look for other skin tags or things that I could do to try to improve his anal hygiene.  Past Medical History  Diagnosis Date  . Difficulty urinating   . Abdominal pain   . Hypertension     resolved with lap band 2 years ago  . GERD (gastroesophageal reflux disease)     resolved with lap band 2 years ago    Past Surgical History  Procedure Laterality Date  . Gastric restriction surgery  12/11/2008    lap band  . Laparoscopic gastric banding  12/11/2008  . Cystoscopy w/ ureteral stent placement  07/18/2011    Procedure: CYSTOSCOPY WITH STENT REPLACEMENT;  Surgeon: Alexis Frock;  Location: [facility];  Service: Urology;  Laterality: Left;  . Colonoscopy  07/17/11    Current Outpatient Prescriptions  Medication Sig Dispense Refill  . alfuzosin (UROXATRAL) 10 MG 24 hr tablet Take 10 mg by mouth daily.       Marland Kitchen amLODipine (NORVASC) 10 MG tablet Take 10 mg by mouth daily.       Marland Kitchen aspirin 325 MG EC tablet Take 325 mg by mouth daily.      . Calcium Carb-Ergocalciferol (CHEWABLE CALCIUM/D PO) Take by mouth.        . docusate sodium (COLACE) 100 MG capsule Take 100 mg by mouth 2 (two) times daily.      . furosemide (LASIX) 40 MG tablet Take 40 mg by mouth daily.       . Pediatric Multiple Vitamins (CHEWABLE MULTIPLE VITAMINS PO) Take by mouth.        . tadalafil (CIALIS) 5 MG tablet Take 5 mg by mouth daily as needed for erectile  dysfunction.      . Tamsulosin HCl (FLOMAX) 0.4 MG CAPS Take 1 capsule (0.4 mg total) by mouth at bedtime.  7 capsule  0  . Telmisartan (MICARDIS PO) Take 80 mg by mouth once.        No current facility-administered medications for this visit.   Review of patient's allergies indicates no known allergies. Family History  Problem Relation Age of Onset  . Cancer Mother     ovarian  . Other Father     MVA   Social History:   reports that he quit smoking about 55 years ago. His smoking use included Cigarettes. He has a 6 pack-year smoking history. He quit smokeless tobacco use about 43 years ago. He reports that he does not drink alcohol or use illicit drugs.   REVIEW OF SYSTEMS - PERTINENT POSITIVES ONLY: Positive for lap band weight loss. He has had a prior band slip that required revision surgery.  Physical Exam:   Blood pressure 154/98, pulse 66, temperature 97.2 F (36.2 C), temperature source Temporal, resp. rate 18, height 6' (1.829 m), weight 291 lb 6.4 oz (132.178 kg). Body mass index is 39.51 kg/(m^2).  Gen:  WDWN white male NAD  Neurological: Alert and oriented to person, place, and time. Motor and sensory function is grossly intact  Head: Normocephalic and atraumatic.  Eyes: Conjunctivae are normal. Pupils are equal, round, and reactive to light. No scleral icterus.  Neck: Normal range of motion. Neck supple. No tracheal deviation or thyromegaly present.  Cardiovascular:  SR without murmurs or gallops.  No carotid bruits Respiratory: Effort normal.  No respiratory distress. No chest wall tenderness. Breath sounds normal.  No wheezes, rales or rhonchi.  Abdomen:  Somewhat protuberant GU:  Rectal reveals a 2 cm long chronically thrombosed hemorrhoid hanging out of his bottom. The stalk seems to emanate above or at the dentate line. Musculoskeletal: Normal range of motion. Extremities are nontender. No cyanosis, edema or clubbing noted Lymphadenopathy: No cervical,  preauricular, postauricular or axillary adenopathy is present Skin: Skin is warm and dry. No rash noted. No diaphoresis. No erythema. No pallor. Pscyh: Normal mood and affect. Behavior is normal. Judgment and thought content normal.   LABORATORY RESULTS: No results found for this or any previous visit (from the past 48 hour(s)).  RADIOLOGY RESULTS: No results found.  Problem List: Patient Active Problem List   Diagnosis Date Noted  . Left Ureteral stone 07/18/2011  . Pyelonephritis 07/18/2011  . Anal fissure 06/17/2011  . History of laparoscopic adjustable gastric banding 06/17/2011    Assessment & Plan: Hemorrhoidal problems with hygiene and for pruritus. Plan exam under exam anesthesia and hemorrhoidectomy    Matt B. Hassell Done, MD, [facility] [phone] beeper [phone]  07/26/2013 10:01 AM

## 2013-08-04 NOTE — Anesthesia Postprocedure Evaluation (Signed)
Anesthesia Post Note  Patient: [name] [name]  Procedure(s) Performed: Procedure(s) (LRB): EUA,HEMORRHOIDECTOMY (N/A)  Anesthesia type: General  Patient location: PACU  Post pain: Pain level controlled  Post assessment: Post-op Vital signs reviewed  Last Vitals: BP 110/62  Pulse 59  Temp(Src) 36.7 C (Oral)  Resp 14  SpO2 92%  Post vital signs: Reviewed  Level of consciousness: sedated  Complications: No apparent anesthesia complications

## 2013-08-04 NOTE — Anesthesia Preprocedure Evaluation (Signed)
Anesthesia Evaluation  Patient identified by MRN, date of birth, ID band Patient awake    Reviewed: Allergy & Precautions, H&P , NPO status , Patient's Chart, lab work & pertinent test results  Airway Mallampati: II TM Distance: >3 FB Neck ROM: Full    Dental no notable dental hx.    Pulmonary neg pulmonary ROS, former smoker,  breath sounds clear to auscultation  Pulmonary exam normal       Cardiovascular hypertension, negative cardio ROS  Rhythm:Regular Rate:Normal     Neuro/Psych negative neurological ROS  negative psych ROS   GI/Hepatic negative GI ROS, Neg liver ROS, GERD-  ,  Endo/Other  Morbid obesity  Renal/GU negative Renal ROS  negative genitourinary   Musculoskeletal negative musculoskeletal ROS (+)   Abdominal   Peds negative pediatric ROS (+)  Hematology negative hematology ROS (+)   Anesthesia Other Findings   Reproductive/Obstetrics negative OB ROS                           Anesthesia Physical  Anesthesia Plan  ASA: III  Anesthesia Plan: General   Post-op Pain Management:    Induction: Intravenous  Airway Management Planned: Oral ETT  Additional Equipment:   Intra-op Plan:   Post-operative Plan: Extubation in OR  Informed Consent: I have reviewed the patients History and Physical, chart, labs and discussed the procedure including the risks, benefits and alternatives for the proposed anesthesia with the patient or authorized representative who has indicated his/her understanding and acceptance.   Dental advisory given  Plan Discussed with: CRNA  Anesthesia Plan Comments:         Anesthesia Quick Evaluation

## 2013-08-04 NOTE — Transfer of Care (Signed)
Immediate Anesthesia Transfer of Care Note  Patient: [name] [name]  Procedure(s) Performed: Procedure(s): EUA,HEMORRHOIDECTOMY (N/A)  Patient Location: PACU  Anesthesia Type:General  Level of Consciousness: Patient easily awoken, sedated, comfortable, cooperative, following commands, responds to stimulation.   Airway & Oxygen Therapy: Patient spontaneously breathing, ventilating well, oxygen via simple oxygen mask.  Post-op Assessment: Report given to PACU RN, vital signs reviewed and stable, moving all extremities.   Post vital signs: Reviewed and stable.  Complications: No apparent anesthesia complications

## 2013-08-07 ENCOUNTER — Encounter (HOSPITAL_COMMUNITY): Payer: Self-pay | Admitting: Surgery

## 2013-08-28 DIAGNOSIS — D649 Anemia, unspecified: Secondary | ICD-10-CM | POA: Diagnosis not present

## 2013-08-30 ENCOUNTER — Encounter (INDEPENDENT_AMBULATORY_CARE_PROVIDER_SITE_OTHER): Payer: Self-pay | Admitting: Surgery

## 2013-08-30 ENCOUNTER — Ambulatory Visit (INDEPENDENT_AMBULATORY_CARE_PROVIDER_SITE_OTHER): Payer: Medicare Other | Admitting: Surgery

## 2013-08-30 VITALS — BP 124/64 | HR 70 | Resp 16 | Ht 72.0 in | Wt 289.0 lb

## 2013-08-30 DIAGNOSIS — K649 Unspecified hemorrhoids: Secondary | ICD-10-CM

## 2013-08-30 NOTE — Progress Notes (Signed)
[name] [name] 68 y.o.  Body mass index is 39.19 kg/(m^2).  Patient Active Problem List   Diagnosis Date Noted  . Hemorrhoids 07/26/2013  . Left Ureteral stone 07/18/2011  . Pyelonephritis 07/18/2011  . Anal fissure 06/17/2011  . History of laparoscopic adjustable gastric banding 06/17/2011    No Known Allergies  Past Surgical History  Procedure Laterality Date  . Gastric restriction surgery  12/11/2008    lap band  . Laparoscopic gastric banding  12/11/2008  . Cystoscopy w/ ureteral stent placement  07/18/2011    Procedure: CYSTOSCOPY WITH STENT REPLACEMENT;  Surgeon: Alexis Frock;  Location: [facility];  Service: Urology;  Laterality: Left;  . Colonoscopy  07/17/11  . Lithotripsy    . Hemorrhoid surgery N/A 08/04/2013    Procedure: EUA,HEMORRHOIDECTOMY;  Surgeon: [name], MD;  Location: [facility];  Service: General;  Laterality: N/A;   Dwan Bolt, MD No diagnosis found.  Doing very well postop.  Path report given-hemorrhoids.  Anus looks good.  Will see back prn his bottom.  He will be seen for band adjustments as needed.   Matt B. Hassell Done, MD, [facility] [phone] beeper [phone]  08/30/2013 3:29 PM

## 2013-10-26 DIAGNOSIS — I10 Essential (primary) hypertension: Secondary | ICD-10-CM | POA: Diagnosis not present

## 2013-10-26 DIAGNOSIS — R7309 Other abnormal glucose: Secondary | ICD-10-CM | POA: Diagnosis not present

## 2013-10-26 DIAGNOSIS — N400 Enlarged prostate without lower urinary tract symptoms: Secondary | ICD-10-CM | POA: Diagnosis not present

## 2013-10-26 DIAGNOSIS — E119 Type 2 diabetes mellitus without complications: Secondary | ICD-10-CM | POA: Diagnosis not present

## 2013-10-26 DIAGNOSIS — D649 Anemia, unspecified: Secondary | ICD-10-CM | POA: Diagnosis not present

## 2013-12-12 DIAGNOSIS — G4733 Obstructive sleep apnea (adult) (pediatric): Secondary | ICD-10-CM | POA: Diagnosis not present

## 2014-01-04 ENCOUNTER — Encounter (INDEPENDENT_AMBULATORY_CARE_PROVIDER_SITE_OTHER): Payer: Self-pay

## 2014-01-04 ENCOUNTER — Ambulatory Visit (INDEPENDENT_AMBULATORY_CARE_PROVIDER_SITE_OTHER): Payer: Medicare Other | Admitting: Physician Assistant

## 2014-01-04 VITALS — BP 118/70 | HR 67 | Temp 97.9°F | Resp 14 | Ht 72.0 in | Wt 258.4 lb

## 2014-01-04 DIAGNOSIS — Z4651 Encounter for fitting and adjustment of gastric lap band: Secondary | ICD-10-CM | POA: Diagnosis not present

## 2014-01-04 NOTE — Patient Instructions (Signed)
Return in August. Focus on good food choices as well as physical activity. Return sooner if you have an increase in hunger, portion sizes or weight. Return also for difficulty swallowing, night cough, reflux.

## 2014-01-04 NOTE — Progress Notes (Signed)
  HISTORY: [name] [name] is a 68 y.o.male who received an AP-Large lap-band in June 2008 with two revisions for slips by Dr. Hassell Done. He comes in today with a remarkable 33 lbs weight loss since his last visit and 55 lbs weight loss since surgery. He reports doing very well since retirement. His level of stress has decreased considerably. He's been able to focus more on his health and being active, which has helped get the pounds off. He does report that in the past week or so that he's had a couple of episodes of over-restriction, the latest being last night. Normally this occurs if he overeats but that wasn't the case yesterday. In addition, he's getting ready to travel by RV for one month followed by a one-week fishing trip in [place] that's relatively remote. He's concerned about his level of restriction right now and what may happen when he's abroad. He wants 3 mL removed as a safety net.  VITAL SIGNS: Filed Vitals:   01/04/14 0904  BP: 118/70  Pulse: 67  Temp: 97.9 F (36.6 C)  Resp: 14    PHYSICAL EXAM: Physical exam reveals a very well-appearing 67 y.o.male in no apparent distress Neurologic: Awake, alert, oriented Psych: Bright affect, conversant Respiratory: Breathing even and unlabored. No stridor or wheezing Abdomen: Soft, nontender, nondistended to palpation. Incisions well-healed. No incisional hernias. Port easily palpated. Extremities: Atraumatic, good range of motion.  ASSESMENT: 68 y.o.  male  s/p AP-Large lap-band.   PLAN: The patient's port was accessed with a 20G Huber needle without difficulty. Clear fluid was aspirated and 3 mL saline was removed from the port to give a total predicted volume of 4 mL. The patient was advised to concentrate on healthy food choices and to avoid slider foods high in fats and carbohydrates. He'll be back in August when we will resume filling.

## 2014-03-01 ENCOUNTER — Ambulatory Visit (INDEPENDENT_AMBULATORY_CARE_PROVIDER_SITE_OTHER): Payer: Medicare Other | Admitting: Physician Assistant

## 2014-03-01 ENCOUNTER — Encounter (INDEPENDENT_AMBULATORY_CARE_PROVIDER_SITE_OTHER): Payer: Self-pay

## 2014-03-01 VITALS — BP 142/82 | HR 72 | Temp 98.0°F | Resp 14 | Ht 72.0 in | Wt 277.4 lb

## 2014-03-01 DIAGNOSIS — Z4651 Encounter for fitting and adjustment of gastric lap band: Secondary | ICD-10-CM | POA: Diagnosis not present

## 2014-03-01 NOTE — Patient Instructions (Signed)

## 2014-03-01 NOTE — Progress Notes (Signed)
  HISTORY: [name] [name] is a 68 y.o.male who received an AP-Large lap-band in June 2010 by Dr. Hassell Done. The patient has gained 19 lbs since their last visit in July, and has lost 26 lbs since surgery. He comes in having returned from his travel with significant weight gain. He had no untoward events with respect to his band but he was able to eat significant portions. He wants to get back on track with fills now that he's back home and has better access to adjustments should the need arise.  VITAL SIGNS: Filed Vitals:   03/01/14 0905  BP: 142/82  Pulse: 72  Temp: 98 F (36.7 C)  Resp: 14    PHYSICAL EXAM: Physical exam reveals a very well-appearing 67 y.o.male in no apparent distress Neurologic: Awake, alert, oriented Psych: Bright affect, conversant Respiratory: Breathing even and unlabored. No stridor or wheezing Abdomen: Soft, nontender, nondistended to palpation. Incisions well-healed. No incisional hernias. Port easily palpated. Extremities: Atraumatic, good range of motion.  ASSESMENT: 68 y.o.  male  s/p AP-Large lap-band.   PLAN: The patient's port was accessed with a 20G Huber needle without difficulty. Clear fluid was aspirated and 1.5 mL saline was added to the port to give a total predicted volume of 5.5 mL. The patient was able to swallow water without difficulty following the procedure and was instructed to take clear liquids for the next 24-48 hours and advance slowly as tolerated. We'll have him back in a month or sooner if needed.

## 2014-03-06 DIAGNOSIS — D649 Anemia, unspecified: Secondary | ICD-10-CM | POA: Diagnosis not present

## 2014-03-15 DIAGNOSIS — N200 Calculus of kidney: Secondary | ICD-10-CM | POA: Diagnosis not present

## 2014-03-15 DIAGNOSIS — I10 Essential (primary) hypertension: Secondary | ICD-10-CM | POA: Diagnosis not present

## 2014-03-15 DIAGNOSIS — D649 Anemia, unspecified: Secondary | ICD-10-CM | POA: Diagnosis not present

## 2014-03-16 ENCOUNTER — Ambulatory Visit (INDEPENDENT_AMBULATORY_CARE_PROVIDER_SITE_OTHER): Payer: Self-pay | Admitting: Emergency Medicine

## 2014-03-16 VITALS — BP 118/76 | HR 64 | Temp 97.7°F | Resp 18 | Ht 71.0 in | Wt 276.0 lb

## 2014-03-16 DIAGNOSIS — Z0000 Encounter for general adult medical examination without abnormal findings: Secondary | ICD-10-CM

## 2014-03-16 DIAGNOSIS — Z0289 Encounter for other administrative examinations: Secondary | ICD-10-CM

## 2014-03-16 NOTE — Progress Notes (Signed)
   Subjective:    Patient ID: [name] [name], male    DOB: 1946-05-23, 68 y.o.   MRN: [id number]  HPI patient here didn't do too. He has a history of hypertension which is followed by Dr. Patrice Paradise. He has a history of kidney stones. He is followed regularly and on medication for this    Review of Systems     Objective:   Physical Exam HEENT exam is unremarkable. Neck supple without adenopathy. Chest clear to auscultation percussion. Heart regular rate and rhythm without murmurs. Abdomen soft liver spleen not enlarged no tenderness. GU exam reveals no hernia palpable.        Assessment & Plan:  Patient presents with a history of hypertension on medication and controlled he has glasses and rash is restricted from driving responses. Qualifies for one year car.

## 2014-04-05 ENCOUNTER — Encounter (INDEPENDENT_AMBULATORY_CARE_PROVIDER_SITE_OTHER): Payer: Medicare Other

## 2014-04-05 DIAGNOSIS — N5201 Erectile dysfunction due to arterial insufficiency: Secondary | ICD-10-CM | POA: Diagnosis not present

## 2014-04-05 DIAGNOSIS — M545 Low back pain: Secondary | ICD-10-CM | POA: Diagnosis not present

## 2014-06-13 DIAGNOSIS — Z23 Encounter for immunization: Secondary | ICD-10-CM | POA: Diagnosis not present

## 2014-06-13 DIAGNOSIS — G4733 Obstructive sleep apnea (adult) (pediatric): Secondary | ICD-10-CM | POA: Diagnosis not present

## 2014-07-09 DIAGNOSIS — E119 Type 2 diabetes mellitus without complications: Secondary | ICD-10-CM | POA: Diagnosis not present

## 2014-07-09 DIAGNOSIS — H2513 Age-related nuclear cataract, bilateral: Secondary | ICD-10-CM | POA: Diagnosis not present

## 2014-07-09 DIAGNOSIS — H52223 Regular astigmatism, bilateral: Secondary | ICD-10-CM | POA: Diagnosis not present

## 2014-07-09 DIAGNOSIS — H5203 Hypermetropia, bilateral: Secondary | ICD-10-CM | POA: Diagnosis not present

## 2014-07-09 DIAGNOSIS — H524 Presbyopia: Secondary | ICD-10-CM | POA: Diagnosis not present

## 2014-07-12 DIAGNOSIS — Z4651 Encounter for fitting and adjustment of gastric lap band: Secondary | ICD-10-CM | POA: Diagnosis not present

## 2014-07-24 DIAGNOSIS — I10 Essential (primary) hypertension: Secondary | ICD-10-CM | POA: Diagnosis not present

## 2014-07-24 DIAGNOSIS — Z125 Encounter for screening for malignant neoplasm of prostate: Secondary | ICD-10-CM | POA: Diagnosis not present

## 2014-07-24 DIAGNOSIS — Z79899 Other long term (current) drug therapy: Secondary | ICD-10-CM | POA: Diagnosis not present

## 2014-07-24 DIAGNOSIS — E118 Type 2 diabetes mellitus with unspecified complications: Secondary | ICD-10-CM | POA: Diagnosis not present

## 2014-07-24 DIAGNOSIS — E789 Disorder of lipoprotein metabolism, unspecified: Secondary | ICD-10-CM | POA: Diagnosis not present

## 2014-07-31 DIAGNOSIS — N400 Enlarged prostate without lower urinary tract symptoms: Secondary | ICD-10-CM | POA: Diagnosis not present

## 2014-07-31 DIAGNOSIS — G4733 Obstructive sleep apnea (adult) (pediatric): Secondary | ICD-10-CM | POA: Diagnosis not present

## 2014-07-31 DIAGNOSIS — I10 Essential (primary) hypertension: Secondary | ICD-10-CM | POA: Diagnosis not present

## 2014-08-23 DIAGNOSIS — Z4651 Encounter for fitting and adjustment of gastric lap band: Secondary | ICD-10-CM | POA: Diagnosis not present

## 2014-08-30 DIAGNOSIS — M1812 Unilateral primary osteoarthritis of first carpometacarpal joint, left hand: Secondary | ICD-10-CM | POA: Diagnosis not present

## 2014-09-27 DIAGNOSIS — Z6841 Body Mass Index (BMI) 40.0 and over, adult: Secondary | ICD-10-CM | POA: Diagnosis not present

## 2014-09-27 DIAGNOSIS — Z9884 Bariatric surgery status: Secondary | ICD-10-CM | POA: Diagnosis not present

## 2014-11-21 DIAGNOSIS — M6289 Other specified disorders of muscle: Secondary | ICD-10-CM | POA: Diagnosis not present

## 2014-11-21 DIAGNOSIS — G4762 Sleep related leg cramps: Secondary | ICD-10-CM | POA: Diagnosis not present

## 2014-11-21 DIAGNOSIS — R0989 Other specified symptoms and signs involving the circulatory and respiratory systems: Secondary | ICD-10-CM | POA: Diagnosis not present

## 2014-11-22 ENCOUNTER — Other Ambulatory Visit: Payer: Self-pay | Admitting: Endocrinology

## 2014-11-22 DIAGNOSIS — M6289 Other specified disorders of muscle: Secondary | ICD-10-CM

## 2014-11-22 DIAGNOSIS — Z6841 Body Mass Index (BMI) 40.0 and over, adult: Secondary | ICD-10-CM | POA: Diagnosis not present

## 2014-11-22 DIAGNOSIS — Z9884 Bariatric surgery status: Secondary | ICD-10-CM | POA: Diagnosis not present

## 2014-11-23 ENCOUNTER — Other Ambulatory Visit: Payer: Self-pay | Admitting: Endocrinology

## 2014-11-23 ENCOUNTER — Ambulatory Visit
Admission: RE | Admit: 2014-11-23 | Discharge: 2014-11-23 | Disposition: A | Discharge status: Home / Self Care | Payer: Medicare Other | Source: Ambulatory Visit | Attending: Endocrinology | Admitting: Endocrinology

## 2014-11-23 ENCOUNTER — Ambulatory Visit
Admission: RE | Admit: 2014-11-23 | Discharge: 2014-11-23 | Disposition: A | Discharge status: Home / Self Care | Payer: BLUE CROSS/BLUE SHIELD | Source: Ambulatory Visit | Attending: Endocrinology | Admitting: Endocrinology

## 2014-11-23 DIAGNOSIS — M6289 Other specified disorders of muscle: Secondary | ICD-10-CM | POA: Diagnosis not present

## 2014-12-28 ENCOUNTER — Encounter: Payer: Self-pay | Admitting: Vascular Surgery

## 2015-01-01 ENCOUNTER — Encounter: Payer: Self-pay | Admitting: Vascular Surgery

## 2015-01-01 ENCOUNTER — Ambulatory Visit (INDEPENDENT_AMBULATORY_CARE_PROVIDER_SITE_OTHER): Payer: Medicare Other | Admitting: Vascular Surgery

## 2015-01-01 VITALS — BP 134/78 | HR 48 | Resp 16 | Ht 72.0 in | Wt 263.0 lb

## 2015-01-01 DIAGNOSIS — M79604 Pain in right leg: Secondary | ICD-10-CM | POA: Diagnosis not present

## 2015-01-01 NOTE — Progress Notes (Signed)
Subjective:     Patient ID: [name] [name], male   DOB: Jun 03, 1946, 69 y.o.   MRN: [id number]  HPI this 69 year old male was referred by Dr. Gareth Eagle for possible vascular occlusive disease. Patient has recently noticed a few episodes of numbness in the lower aspect of his right leg extending into the foot. This generally last for a few minutes. He also has occasional aching discomfort in both legs from the hips to the thighs. This is not necessarily related ambulation. He does not smoke. He does not have diabetes mellitus. He has no history of nonhealing ulcers infection gangrene cellulitis in either foot.  Past Medical History  Diagnosis Date  . Difficulty urinating     prostate problem  . Hypertension     resolved with lap band 2 years ago  . Sleep apnea     uses cpap setting is 12  . History of elevated glucose     IN THE PAST - NO PROBLEMS SINCE GASTRIC BANDING WEIGHT LOSS   . GERD (gastroesophageal reflux disease)     HIATAL HERNIA REPAIRED -with lap band 2 years ago NO LONGER HAS GERD    History  Substance Use Topics  . Smoking status: Former Smoker -- 1.50 packs/day for 9 years    Types: Cigarettes  . Smokeless tobacco: Former Systems developer  . Alcohol Use: No     Comment: QUIT SMOKING 1972    Family History  Problem Relation Age of Onset  . Cancer Mother     ovarian  . Diabetes Mother   . Other Father     MVA    No Known Allergies   Current outpatient prescriptions:  .  alfuzosin (UROXATRAL) 10 MG 24 hr tablet, Take 10 mg by mouth daily. , Disp: , Rfl:  .  amLODipine (NORVASC) 10 MG tablet, Take 10 mg by mouth every morning. , Disp: , Rfl:  .  aspirin 325 MG EC tablet, Take 325 mg by mouth daily., Disp: , Rfl:  .  docusate sodium (COLACE) 100 MG capsule, Take 100 mg by mouth 2 (two) times daily., Disp: , Rfl:  .  furosemide (LASIX) 40 MG tablet, Take 40 mg by mouth every morning. , Disp: , Rfl:  .  pantoprazole (PROTONIX) 40 MG tablet, Take 40 mg by mouth daily., Disp:  , Rfl:  .  Pediatric Multiple Vitamins (CHEWABLE MULTIPLE VITAMINS PO), Take by mouth.  , Disp: , Rfl:  .  tadalafil (CIALIS) 5 MG tablet, Take 5 mg by mouth daily as needed for erectile dysfunction., Disp: , Rfl:  .  telmisartan (MICARDIS) 80 MG tablet, Take 80 mg by mouth every morning., Disp: , Rfl:  .  Calcium Carbonate-Vitamin D (CALCIUM + D PO), Take 1 tablet by mouth daily., Disp: , Rfl:   Filed Vitals:   01/01/15 1534  BP: 134/78  Pulse: 48  Resp: 16  Height: 6' (1.829 m)  Weight: 263 lb (119.296 kg)    Body mass index is 35.66 kg/(m^2).           Review of Systems has history of sleep apnea, gastroesophageal reflux disease, obesity treated by lap band surgery,     Objective:   Physical Exam BP 134/78 mmHg  Pulse 48  Resp 16  Ht 6' (1.829 m)  Wt 263 lb (119.296 kg)  BMI 35.66 kg/m2  Gen.-alert and oriented x3 in no apparent distress HEENT normal for age Lungs no rhonchi or wheezing Cardiovascular regular rhythm no murmurs carotid pulses  3+ palpable no bruits audible Abdomen soft nontender no palpable masses Musculoskeletal free of  major deformities Skin clear -no rashes Neurologic normal Lower extremities 3+ femoral and dorsalis pedis and posterior tibial pulses palpable bilaterally with no edema-both feet well perfused-no evidence of infection or gangrene  Today I reviewed the report from [facility] imaging performed on 11/23/2014. There is triphasic flow in both lower extremities with ABIs exceeding 1 bilaterally and no evidence of arterial insufficiency       Assessment:     Leg pain and numbness right foot-not due to vascular insufficiency Patient has normal arterial exam with excellent pulses in both feet and normal ABIs    Plan:     This could represent a nerve compression syndrome. If this continues patient may need further evaluation by neurology or orthopedic or neurosurgery  no need for any further vascular evaluation

## 2015-01-30 DIAGNOSIS — I10 Essential (primary) hypertension: Secondary | ICD-10-CM | POA: Diagnosis not present

## 2015-02-06 DIAGNOSIS — G629 Polyneuropathy, unspecified: Secondary | ICD-10-CM | POA: Diagnosis not present

## 2015-02-06 DIAGNOSIS — Z79899 Other long term (current) drug therapy: Secondary | ICD-10-CM | POA: Diagnosis not present

## 2015-02-06 DIAGNOSIS — I10 Essential (primary) hypertension: Secondary | ICD-10-CM | POA: Diagnosis not present

## 2015-02-20 ENCOUNTER — Ambulatory Visit (INDEPENDENT_AMBULATORY_CARE_PROVIDER_SITE_OTHER): Payer: Self-pay | Admitting: Emergency Medicine

## 2015-02-20 ENCOUNTER — Encounter: Payer: Self-pay | Admitting: Emergency Medicine

## 2015-02-20 VITALS — BP 124/70 | HR 53 | Temp 98.9°F | Resp 18 | Ht 71.0 in | Wt 257.0 lb

## 2015-02-20 DIAGNOSIS — Z024 Encounter for examination for driving license: Secondary | ICD-10-CM

## 2015-02-20 DIAGNOSIS — G4733 Obstructive sleep apnea (adult) (pediatric): Secondary | ICD-10-CM

## 2015-02-20 DIAGNOSIS — Z021 Encounter for pre-employment examination: Secondary | ICD-10-CM

## 2015-02-20 NOTE — Progress Notes (Signed)
Subjective:  Patient ID: [name] [name], male    DOB: [birth year]  Age: 69 y.o. MRN: [id number]  CC: Annual Exam   HPI [name] [name] presents  DOT with  OSA  History [name] [name] has a past medical history of Difficulty urinating; Hypertension; Sleep apnea; History of elevated glucose; and GERD (gastroesophageal reflux disease).   He has past surgical history that includes Gastric restriction surgery (12/11/2008); Laparoscopic gastric banding (12/11/2008); Cystoscopy w/ ureteral stent placement (07/18/2011); Colonoscopy (07/17/11); Lithotripsy; and Hemorrhoid surgery (N/A, 08/04/2013).   His  family history includes Cancer in his mother; Diabetes in his mother; Other in his father.  He   reports that he has quit smoking. His smoking use included Cigarettes. He has a 13.5 pack-year smoking history. He has quit using smokeless tobacco. He reports that he does not drink alcohol or use illicit drugs.  Outpatient Prescriptions Prior to Visit  Medication Sig Dispense Refill  . telmisartan (MICARDIS) 80 MG tablet Take 80 mg by mouth every morning.    Marland Kitchen alfuzosin (UROXATRAL) 10 MG 24 hr tablet Take 10 mg by mouth daily.     Marland Kitchen amLODipine (NORVASC) 10 MG tablet Take 10 mg by mouth every morning.     Marland Kitchen aspirin 325 MG EC tablet Take 325 mg by mouth daily.    . furosemide (LASIX) 40 MG tablet Take 40 mg by mouth every morning.     . pantoprazole (PROTONIX) 40 MG tablet Take 40 mg by mouth daily.    . tadalafil (CIALIS) 5 MG tablet Take 5 mg by mouth daily as needed for erectile dysfunction.    . Calcium Carbonate-Vitamin D (CALCIUM + D PO) Take 1 tablet by mouth daily.    Marland Kitchen docusate sodium (COLACE) 100 MG capsule Take 100 mg by mouth 2 (two) times daily.    . Pediatric Multiple Vitamins (CHEWABLE MULTIPLE VITAMINS PO) Take by mouth.       No facility-administered medications prior to visit.    Social History   Social History  . Marital Status: Married    Spouse Name: N/A  . Number of Children: N/A    . Years of Education: N/A   Social History Main Topics  . Smoking status: Former Smoker -- 1.50 packs/day for 9 years    Types: Cigarettes  . Smokeless tobacco: Former Systems developer  . Alcohol Use: No     Comment: [place]  . Drug Use: No  . Sexual Activity: Not Asked   Other Topics Concern  . None   Social History Narrative     Review of Systems  Constitutional: Negative for fever, chills and appetite change.  HENT: Negative for congestion, ear pain, postnasal drip, sinus pressure and sore throat.   Eyes: Negative for pain and redness.  Respiratory: Negative for cough, shortness of breath and wheezing.   Cardiovascular: Negative for leg swelling.  Gastrointestinal: Negative for nausea, vomiting, abdominal pain, diarrhea, constipation and blood in stool.  Endocrine: Negative for polyuria.  Genitourinary: Negative for dysuria, urgency, frequency and flank pain.  Musculoskeletal: Negative for gait problem.  Skin: Negative for rash.  Neurological: Negative for weakness and headaches.  Psychiatric/Behavioral: Negative for confusion and decreased concentration. The patient is not nervous/anxious.     Objective:  BP 124/70 mmHg  Pulse 53  Temp(Src) 98.9 F (37.2 C)  Resp 18  Ht 5\' 11"  (1.803 m)  Wt 257 lb (116.574 kg)  BMI 35.86 kg/m2  SpO2 97%  Physical Exam  Constitutional: He is  oriented to person, place, and time. He appears well-developed and well-nourished. No distress.  HENT:  Head: Normocephalic and atraumatic.  Right Ear: External ear normal.  Left Ear: External ear normal.  Nose: Nose normal.  Eyes: Conjunctivae and EOM are normal. Pupils are equal, round, and reactive to light. No scleral icterus.  Neck: Normal range of motion. Neck supple. No tracheal deviation present.  Cardiovascular: Normal rate, regular rhythm and normal heart sounds.   Pulmonary/Chest: Effort normal. No respiratory distress. He has no wheezes. He has no rales.  Abdominal: He  exhibits no mass. There is no tenderness. There is no rebound and no guarding.  Musculoskeletal: He exhibits no edema.  Lymphadenopathy:    He has no cervical adenopathy.  Neurological: He is alert and oriented to person, place, and time. Coordination normal.  Skin: Skin is warm and dry. No rash noted.  Psychiatric: He has a normal mood and affect. His behavior is normal.      Assessment & Plan:   [name] [name] was seen today for annual exam.  Diagnoses and all orders for this visit:  Encounter for commercial driver medical examination (CDME)  Obstructive sleep apnea   I have discontinued [name] [name]'s Pediatric Multiple Vitamins (CHEWABLE MULTIPLE VITAMINS PO), docusate sodium, and Calcium Carbonate-Vitamin D (CALCIUM + D PO). I am also having him maintain his alfuzosin, amLODipine, furosemide, aspirin, tadalafil, telmisartan, and pantoprazole.  No orders of the defined types were placed in this encounter.   DOT issued months pending receipt of his sleep apnea report  Appropriate red flag conditions were discussed with the patient as well as actions that should be taken.  Patient expressed his understanding.  Follow-up: No Follow-up on file.  Roselee Culver, MD

## 2015-02-21 ENCOUNTER — Telehealth: Payer: Self-pay

## 2015-02-21 DIAGNOSIS — Z6841 Body Mass Index (BMI) 40.0 and over, adult: Secondary | ICD-10-CM | POA: Diagnosis not present

## 2015-02-21 DIAGNOSIS — Z9884 Bariatric surgery status: Secondary | ICD-10-CM | POA: Diagnosis not present

## 2015-02-21 NOTE — Telephone Encounter (Signed)
In Dr. Ouida Sills box.

## 2015-02-21 NOTE — Telephone Encounter (Signed)
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  Please call him when he can pick up the 1-year card.  [phone]

## 2015-02-28 ENCOUNTER — Telehealth: Payer: Self-pay | Admitting: Emergency Medicine

## 2015-03-07 ENCOUNTER — Ambulatory Visit (INDEPENDENT_AMBULATORY_CARE_PROVIDER_SITE_OTHER): Payer: Medicare Other | Admitting: Diagnostic Neuroimaging

## 2015-03-07 ENCOUNTER — Encounter (INDEPENDENT_AMBULATORY_CARE_PROVIDER_SITE_OTHER): Payer: Self-pay | Admitting: Diagnostic Neuroimaging

## 2015-03-07 DIAGNOSIS — R208 Other disturbances of skin sensation: Secondary | ICD-10-CM | POA: Diagnosis not present

## 2015-03-07 DIAGNOSIS — R200 Anesthesia of skin: Secondary | ICD-10-CM

## 2015-03-07 DIAGNOSIS — Z0289 Encounter for other administrative examinations: Secondary | ICD-10-CM

## 2015-03-08 NOTE — Procedures (Signed)
   [facility]  NCS (NERVE CONDUCTION STUDY) WITH EMG (ELECTROMYOGRAPHY) REPORT   STUDY DATE: 03/07/15 PATIENT NAME: [name] [name] DOB: 1945-07-13 MRN: [id number]  ORDERING CLINICIAN: Wilson Singer  TECHNOLOGIST: Laretta Alstrom  ELECTROMYOGRAPHER: Earlean Polka. Penumalli, MD  CLINICAL INFORMATION: 69 year old male with right foot numbness since March 2016. Symptoms are intermittent, worse with standing. Episodes sometimes associated with twitching sensation of the toes. Patient has history of diabetes. Also has history of back pain.  FINDINGS: NERVE CONDUCTION STUDY: Left peroneal and bilateral tibial motor responses and F wave latencies are normal.   Right peroneal motor response could not be obtained with recording over extensor digitorum brevis; with recording over tibialis anterior there is decreased amplitude and normal conduction velocity.  Bilateral H reflex responses cannot be obtained. Right peroneal sensory response could not be obtained. Left peroneal sensory response is normal.  NEEDLE ELECTROMYOGRAPHY: Needle examination of selected muscles of right lower extremity demonstrates: - Vastus medialis is normal. - Tibialis anterior is normal. - Gastrocnemius shows 2+ positive sharp waves and fibrillation potentials at rest and significant decreased motor unit recruitment on exertion.  Bilateral L5-S1 paraspinal muscles times a 2+ positive sharp waves and fibrillation potentials and intermittent complex repetitive discharges.   IMPRESSION:  Abnormal study demonstrating: 1. Electrodiagnostic evidence of right L5, S1 radiculopathies. 2. Bilateral lower lumbar root irritation noted on paraspinal needle EMG.     INTERPRETING PHYSICIAN:  Penni Bombard, MD Certified in Neurology, Neurophysiology and Neuroimaging  [facility] [address] [place], [place] [address] [phone]

## 2015-04-05 ENCOUNTER — Encounter: Payer: Self-pay | Admitting: Diagnostic Neuroimaging

## 2015-04-05 ENCOUNTER — Ambulatory Visit (INDEPENDENT_AMBULATORY_CARE_PROVIDER_SITE_OTHER): Payer: Medicare Other | Admitting: Diagnostic Neuroimaging

## 2015-04-05 VITALS — BP 117/66 | HR 62 | Ht 72.0 in | Wt 265.0 lb

## 2015-04-05 DIAGNOSIS — R200 Anesthesia of skin: Secondary | ICD-10-CM

## 2015-04-05 DIAGNOSIS — M5442 Lumbago with sciatica, left side: Secondary | ICD-10-CM | POA: Diagnosis not present

## 2015-04-05 DIAGNOSIS — M5441 Lumbago with sciatica, right side: Secondary | ICD-10-CM | POA: Diagnosis not present

## 2015-04-05 DIAGNOSIS — R251 Tremor, unspecified: Secondary | ICD-10-CM

## 2015-04-05 DIAGNOSIS — R208 Other disturbances of skin sensation: Secondary | ICD-10-CM

## 2015-04-05 NOTE — Progress Notes (Signed)
[facility]  PATIENT: [name] [name] DOB: Nov 27, 1945  REFERRING CLINICIAN: Kohut HISTORY FROM: patient and wife  REASON FOR VISIT: new consult   HISTORICAL  CHIEF COMPLAINT:  Chief Complaint  Patient presents with  . Numbness    rm 7, abnormal NCS    HISTORY OF PRESENT ILLNESS:   69 year old right-handed male here for valuation of right foot numbness and twitching. March 2016 patient had onset of intermittent numbness and involuntary movement of the right foot lasting for 5-10 seconds at a time. He has had 6-10 attacks since March 2016. Symptoms typically affect him when he has been standing upright for a certain period of time. Movement bending walking do not seem to aggravate the symptoms. Symptoms do not affect him when he is sitting down. He recalls one particularly severe attack in June 2016 when he was shopping with his wife, had significant numbness in the right foot, was concerned about strokelike symptoms and went to the parking lot to sit his car. He also had significant anxiety with this attack and patient is not sure of the contribution of anxiety versus the neurologic symptoms. Symptoms ultimately resolved. He called his PCP to discuss the symptoms. Patient did not go to the emergency room.  Patient was referred to me for EMG study on 03/07/15. Study was slightly abnormal demonstrating evidence of right L5-S1 radiculopathies.   REVIEW OF SYSTEMS: Full 14 system review of systems performed and notable only for ringing in ears.  ALLERGIES: No Known Allergies  HOME MEDICATIONS: Outpatient Prescriptions Prior to Visit  Medication Sig Dispense Refill  . alfuzosin (UROXATRAL) 10 MG 24 hr tablet Take 10 mg by mouth daily.     Marland Kitchen amLODipine (NORVASC) 10 MG tablet Take 10 mg by mouth every morning.     Marland Kitchen aspirin 325 MG EC tablet Take 325 mg by mouth daily.    . furosemide (LASIX) 40 MG tablet Take 40 mg by mouth every morning.     . pantoprazole (PROTONIX)  40 MG tablet Take 40 mg by mouth daily.    . tadalafil (CIALIS) 5 MG tablet Take 5 mg by mouth daily as needed for erectile dysfunction.    Marland Kitchen telmisartan (MICARDIS) 80 MG tablet Take 80 mg by mouth every morning.     No facility-administered medications prior to visit.    PAST MEDICAL HISTORY: Past Medical History  Diagnosis Date  . Difficulty urinating     prostate problem  . Hypertension     resolved with lap band 2 years ago  . Sleep apnea     uses cpap setting is 12  . History of elevated glucose     IN THE PAST - NO PROBLEMS SINCE GASTRIC BANDING WEIGHT LOSS   . GERD (gastroesophageal reflux disease)     HIATAL HERNIA REPAIRED -with lap band 2 years ago NO LONGER HAS GERD    PAST SURGICAL HISTORY: Past Surgical History  Procedure Laterality Date  . Gastric restriction surgery  12/11/2008    lap band  . Laparoscopic gastric banding  12/11/2008  . Cystoscopy w/ ureteral stent placement  07/18/2011    Procedure: CYSTOSCOPY WITH STENT REPLACEMENT;  Surgeon: Alexis Frock;  Location: [facility];  Service: Urology;  Laterality: Left;  . Colonoscopy  07/17/11  . Lithotripsy    . Hemorrhoid surgery N/A 08/04/2013    Procedure: EUA,HEMORRHOIDECTOMY;  Surgeon: [name], MD;  Location: [facility];  Service: General;  Laterality: N/A;    FAMILY HISTORY: Family  History  Problem Relation Age of Onset  . Cancer Mother     ovarian  . Diabetes Mother   . Dementia Mother   . Other Father     MVA    SOCIAL HISTORY:  Social History   Social History  . Marital Status: Married    Spouse Name: [name]  . Number of Children: 2  . Years of Education: 12   Occupational History  .      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 Smoking status: Former Smoker -- 1.50 packs/day for 9 years    Types: Cigarettes    Quit date: 04/05/1971  . Smokeless tobacco: Former Systems developer  . Alcohol Use: No     Comment: occas wine  . Drug Use: No  . Sexual Activity: Not on file     Other Topics Concern  . Not on file   Social History Narrative   Married, lives at home with wife   Caffeine use- coffee 2 cups daily     PHYSICAL EXAM  GENERAL EXAM/CONSTITUTIONAL: Vitals:  Filed Vitals:   04/05/15 0959  BP: 117/66  Pulse: 62  Height: 6' (1.829 m)  Weight: 265 lb (120.203 kg)     Body mass index is 35.93 kg/(m^2).  Visual Acuity Screening   Right eye Left eye Both eyes  Without correction:     With correction: 20/30 20/30      Patient is in no distress; well developed, nourished and groomed; neck is supple  STRAIGHT LEG RAISE SLIGHTLY POSITIVE ON THE RIGHT  CARDIOVASCULAR:  Examination of carotid arteries is normal; no carotid bruits  Regular rate and rhythm, no murmurs  Examination of peripheral vascular system by observation and palpation is normal  EYES:  Ophthalmoscopic exam of optic discs and posterior segments is normal; no papilledema or hemorrhages  MUSCULOSKELETAL:  Gait, strength, tone, movements noted in Neurologic exam below  NEUROLOGIC: MENTAL STATUS:  No flowsheet data found.  awake, alert, oriented to person, place and time  recent and remote memory intact  normal attention and concentration  language fluent, comprehension intact, naming intact,   fund of knowledge appropriate  CRANIAL NERVE:   2nd - no papilledema on fundoscopic exam  2nd, 3rd, 4th, 6th - pupils equal and reactive to light, visual fields full to confrontation, extraocular muscles intact, no nystagmus  5th - facial sensation symmetric  7th - facial strength symmetric  8th - hearing intact  9th - palate elevates symmetrically, uvula midline  11th - shoulder shrug symmetric  12th - tongue protrusion midline  MOTOR:   normal bulk and tone, full strength in the BUE, BLE  SENSORY:   normal and symmetric to light touch, temperature, vibration  COORDINATION:   finger-nose-finger, fine finger movements normal  REFLEXES:   deep  tendon reflexes TRACE and symmetric  GAIT/STATION:   narrow based gait; romberg is negative    DIAGNOSTIC DATA (LABS, IMAGING, TESTING) - I reviewed patient records, labs, notes, testing and imaging myself where available.  Lab Results  Component Value Date   WBC 6.2 07/31/2013   HGB 13.4 07/31/2013   HCT 39.6 07/31/2013   MCV 93.8 07/31/2013   PLT 174 07/31/2013      Component Value Date/Time   NA 143 07/31/2013 0845   K 4.3 07/31/2013 0845   CL 103 07/31/2013 0845   CO2 28 07/31/2013 0845   GLUCOSE 117* 07/31/2013 0845   BUN 13 07/31/2013 0845  CREATININE 0.73 07/31/2013 0845   CALCIUM 8.6 07/31/2013 0845   PROT 7.5 12/06/2008 1006   ALBUMIN 4.2 12/06/2008 1006   AST 23 12/06/2008 1006   ALT 26 12/06/2008 1006   ALKPHOS 40 12/06/2008 1006   BILITOT 0.6 12/06/2008 1006   GFRNONAA >90 07/31/2013 0845   GFRAA >90 07/31/2013 0845   No results found for: CHOL, HDL, LDLCALC, LDLDIRECT, TRIG, CHOLHDL No results found for: HGBA1C No results found for: VITAMINB12 No results found for: TSH   03/08/15 EMG/NCS 1. Electrodiagnostic evidence of right L5, S1 radiculopathies. 2. Bilateral lower lumbar root irritation noted on paraspinal needle EMG.     ASSESSMENT AND PLAN  69 y.o. year old male here with intermittent right foot numbness and tremor, particularly with standing position, most likely related to lumbar radiculopathy. Will check MRI lumbar spine and refer to PT for further evaluation. Due to transient and stereotypical nature of symptoms, will check additional studies to work up TIA and seizure possibilities.  Dx:  Numbness of foot - Plan: MR Brain Wo Contrast, MR Lumbar Spine Wo Contrast, EEG adult, US Carotid Bilateral, Ambulatory referral to Physical Therapy  Bilateral low back pain with sciatica, sciatica laterality unspecified - Plan: MR Brain Wo Contrast, MR Lumbar Spine Wo Contrast, EEG adult, US Carotid Bilateral, Ambulatory referral to Physical  Therapy  Tremor - Plan: MR Brain Wo Contrast, MR Lumbar Spine Wo Contrast, EEG adult, US Carotid Bilateral, Ambulatory referral to Physical Therapy    PLAN:  Orders Placed This Encounter  Procedures  . MR Brain Wo Contrast  . MR Lumbar Spine Wo Contrast  . US Carotid Bilateral  . Ambulatory referral to Physical Therapy  . EEG adult   Return in about 6 weeks (around 05/17/2015).    Penni Bombard, MD 1/66/0630, 16:01 AM Certified in Neurology, Neurophysiology and Neuroimaging  [facility] [address] [place], [place] [address] [phone]

## 2015-04-05 NOTE — Patient Instructions (Signed)
Thank you for coming to see [place] at [facility]. I hope we have been able to provide you high quality care today.  You may receive a patient satisfaction survey over the next few weeks. We would appreciate your feedback and comments so that we may continue to improve ourselves and the health of our patients.  - I will check MRI brain and lumbar spine - I will setup carotid ultrasound and EEG - I will setup physical therapy evaluation - continue aspirin and blood pressure   ~~~~~~~~~~~~~~~~~~~~~~~~~~~~~~~~~~~~~~~~~~~~~~~~~~~~~~~~~~~~~~~~~  DR. PENUMALLI'S GUIDE TO HAPPY AND HEALTHY LIVING These are some of my general health and wellness recommendations. Some of them may apply to you better than others. Please use common sense as you try these suggestions and feel free to ask me any questions.   ACTIVITY/FITNESS Mental, social, emotional and physical stimulation are very important for brain and body health. Try learning a new activity (arts, music, language, sports, games).  Keep moving your body to the best of your abilities. You can do this at home, inside or outside, the park, community center, gym or anywhere you like. Consider a physical therapist or personal trainer to get started. Consider the app Sworkit. Fitness trackers such as smart-watches, smart-phones or Fitbits can help as well.   NUTRITION Eat more plants: colorful vegetables, nuts, seeds and berries.  Eat less sugar, salt, preservatives and processed foods.  Avoid toxins such as cigarettes and alcohol.  Drink water when you are thirsty. Warm water with a slice of lemon is an excellent morning drink to start the day.  Consider these websites for more information The Nutrition Source (https://www.henry-hernandez.biz/) Precision Nutrition (WindowBlog.ch)   RELAXATION Consider practicing mindfulness meditation or other relaxation techniques such as deep  breathing, prayer, yoga, tai chi, massage. See website mindful.org or the apps Headspace or Calm to help get started.   SLEEP Try to get at least 7-8+ hours sleep per day. Regular exercise and reduced caffeine will help you sleep better. Practice good sleep hygeine techniques. See website sleep.org for more information.   PLANNING Prepare estate planning, living will, healthcare POA documents. Sometimes this is best planned with the help of an attorney. Theconversationproject.org and agingwithdignity.org are excellent resources.

## 2015-04-26 ENCOUNTER — Encounter: Payer: Self-pay | Admitting: Emergency Medicine

## 2015-04-29 ENCOUNTER — Ambulatory Visit (INDEPENDENT_AMBULATORY_CARE_PROVIDER_SITE_OTHER): Payer: Medicare Other | Admitting: Diagnostic Neuroimaging

## 2015-04-29 ENCOUNTER — Ambulatory Visit
Admission: RE | Admit: 2015-04-29 | Discharge: 2015-04-29 | Disposition: A | Discharge status: Home / Self Care | Payer: Medicare Other | Source: Ambulatory Visit | Attending: Diagnostic Neuroimaging | Admitting: Diagnostic Neuroimaging

## 2015-04-29 DIAGNOSIS — R200 Anesthesia of skin: Secondary | ICD-10-CM

## 2015-04-29 DIAGNOSIS — R251 Tremor, unspecified: Secondary | ICD-10-CM

## 2015-04-29 DIAGNOSIS — R208 Other disturbances of skin sensation: Secondary | ICD-10-CM | POA: Diagnosis not present

## 2015-04-29 DIAGNOSIS — M5442 Lumbago with sciatica, left side: Secondary | ICD-10-CM

## 2015-04-29 DIAGNOSIS — M5441 Lumbago with sciatica, right side: Secondary | ICD-10-CM

## 2015-04-29 NOTE — Procedures (Signed)
   [facility]  EEG (ELECTROENCEPHALOGRAM) REPORT   STUDY DATE: 04/29/15 PATIENT NAME: [name] [name] DOB: May 08, 1946 MRN: [id number]  ORDERING CLINICIAN: Andrey Spearman, MD   TECHNOLOGIST: Laretta Alstrom  TECHNIQUE: Electroencephalogram was recorded utilizing standard 10-20 system of lead placement and reformatted into average and bipolar montages.  RECORDING TIME: 30 minutes  ACTIVATION: hyperventilation and photic stimulation  CLINICAL INFORMATION: 69 year old male with right foot twitching.  FINDINGS: Background rhythms of 9-10 hertz and 40-60 microvolts. No focal, lateralizing, epileptiform activity or seizures are seen. Patient recorded in the awake and drowsy state. EKG channel shows regular rhythm of 54 beats per minute.  IMPRESSION:  Normal EEG in the awake and drowsy states.     INTERPRETING PHYSICIAN:  Penni Bombard, MD Certified in Neurology, Neurophysiology and Neuroimaging  [facility] [address] [place], [place] [address] [phone]

## 2015-05-01 ENCOUNTER — Ambulatory Visit (INDEPENDENT_AMBULATORY_CARE_PROVIDER_SITE_OTHER): Payer: Medicare Other

## 2015-05-01 ENCOUNTER — Telehealth: Payer: Self-pay | Admitting: *Deleted

## 2015-05-01 DIAGNOSIS — R208 Other disturbances of skin sensation: Secondary | ICD-10-CM | POA: Diagnosis not present

## 2015-05-01 DIAGNOSIS — R200 Anesthesia of skin: Secondary | ICD-10-CM

## 2015-05-01 DIAGNOSIS — R251 Tremor, unspecified: Secondary | ICD-10-CM

## 2015-05-01 DIAGNOSIS — M5442 Lumbago with sciatica, left side: Secondary | ICD-10-CM

## 2015-05-01 DIAGNOSIS — M5441 Lumbago with sciatica, right side: Secondary | ICD-10-CM

## 2015-05-01 NOTE — Telephone Encounter (Signed)
Left VM requesting call back; left this caller's name, number.

## 2015-05-02 ENCOUNTER — Telehealth: Payer: Self-pay | Admitting: *Deleted

## 2015-05-02 NOTE — Telephone Encounter (Signed)
Spoke with patient and informed him, per Dr Leta Baptist, his MRI results showed "no major findings". He verbalized understanding, appreciation for this call. He further stated he has been so impressed with the service he has received from this office and [place].

## 2015-05-02 NOTE — Telephone Encounter (Signed)
Spoke with patient. I advised him that I sent his MRI order over to [facility] on 04/05/15 and that [facility] tried to call him on 04/08/15 to schedule. Patient stated that he did not receive a call from them. I gave him [facility] # [phone] for him to call them and schedule. He stated that he would call and schedule with them. Thanks!

## 2015-05-02 NOTE — Telephone Encounter (Signed)
Patient called back inquiring about another MRI, stated he is to have MRI of lower back done. Informed him the results this RN reported to him are MRI Brain. He stated he has not been contacted about the MRI of L spine. Informed him Dr Gladstone Lighter 04/05/15  OV note indicated he ordered MRI lumbar spine. Informed him this RN will route his question to Drucie Opitz. He then inquired about EEG results; informed him that report states "normal EEG." he verbalized understanding, appreciation. He is aware Seth Bake will contact him re: MRI L spine.

## 2015-05-03 ENCOUNTER — Telehealth: Payer: Self-pay | Admitting: *Deleted

## 2015-05-03 NOTE — Telephone Encounter (Signed)
Spoke with patient and informed him, per Dr Leta Baptist his [place] results are normal. He verbalized understanding, expressed appreciation for this RN's service.

## 2015-05-10 ENCOUNTER — Ambulatory Visit
Admission: RE | Admit: 2015-05-10 | Discharge: 2015-05-10 | Disposition: A | Discharge status: Home / Self Care | Payer: Medicare Other | Source: Ambulatory Visit | Attending: Diagnostic Neuroimaging | Admitting: Diagnostic Neuroimaging

## 2015-05-10 DIAGNOSIS — M5442 Lumbago with sciatica, left side: Secondary | ICD-10-CM

## 2015-05-10 DIAGNOSIS — R208 Other disturbances of skin sensation: Secondary | ICD-10-CM | POA: Diagnosis not present

## 2015-05-10 DIAGNOSIS — R200 Anesthesia of skin: Secondary | ICD-10-CM

## 2015-05-10 DIAGNOSIS — M5441 Lumbago with sciatica, right side: Secondary | ICD-10-CM | POA: Diagnosis not present

## 2015-05-10 DIAGNOSIS — R251 Tremor, unspecified: Secondary | ICD-10-CM

## 2015-05-21 ENCOUNTER — Encounter: Payer: Self-pay | Admitting: Diagnostic Neuroimaging

## 2015-05-21 ENCOUNTER — Ambulatory Visit (INDEPENDENT_AMBULATORY_CARE_PROVIDER_SITE_OTHER): Payer: Medicare Other | Admitting: Diagnostic Neuroimaging

## 2015-05-21 VITALS — BP 115/65 | HR 62 | Ht 72.0 in | Wt 265.0 lb

## 2015-05-21 DIAGNOSIS — M5442 Lumbago with sciatica, left side: Secondary | ICD-10-CM

## 2015-05-21 DIAGNOSIS — M5441 Lumbago with sciatica, right side: Secondary | ICD-10-CM

## 2015-05-21 DIAGNOSIS — M4806 Spinal stenosis, lumbar region: Secondary | ICD-10-CM | POA: Diagnosis not present

## 2015-05-21 DIAGNOSIS — M48062 Spinal stenosis, lumbar region with neurogenic claudication: Secondary | ICD-10-CM

## 2015-05-21 NOTE — Patient Instructions (Signed)
-   try physical therapy evalution

## 2015-05-21 NOTE — Progress Notes (Signed)
[facility]  PATIENT: [name] [name] DOB: 01-25-1946  REFERRING CLINICIAN: Kohut HISTORY FROM: patient and wife  REASON FOR VISIT: new consult   HISTORICAL  CHIEF COMPLAINT:  Chief Complaint  Patient presents with  . Follow-up    In room 6 with wife. Here for f/u. No new complaints. Still having some numbness in right foot.     HISTORY OF PRESENT ILLNESS:   UPDATE 05/21/15: Since last visit, symptoms are stable. Test results reviewed. Still with intermittent numbness and twitching in right foot (sometimes left) esp with prolonged standing or walking.   PRIOR HPI (04/05/15): 69 year old right-handed male here for valuation of right foot numbness and twitching. March 2016 patient had onset of intermittent numbness and involuntary movement of the right foot lasting for 5-10 seconds at a time. He has had 6-10 attacks since March 2016. Symptoms typically affect him when he has been standing upright for a certain period of time. Movement bending walking do not seem to aggravate the symptoms. Symptoms do not affect him when he is sitting down. He recalls one particularly severe attack in June 2016 when he was shopping with his wife, had significant numbness in the right foot, was concerned about strokelike symptoms and went to the parking lot to sit his car. He also had significant anxiety with this attack and patient is not sure of the contribution of anxiety versus the neurologic symptoms. Symptoms ultimately resolved. He called his PCP to discuss the symptoms. Patient did not go to the emergency room. Patient was referred to me for EMG study on 03/07/15. Study was slightly abnormal demonstrating evidence of right L5-S1 radiculopathies.   REVIEW OF SYSTEMS: Full 14 system review of systems performed and notable only for ringing in ears.  ALLERGIES: No Known Allergies  HOME MEDICATIONS: Outpatient Prescriptions Prior to Visit  Medication Sig Dispense Refill  . alfuzosin  (UROXATRAL) 10 MG 24 hr tablet Take 10 mg by mouth daily.     Marland Kitchen amLODipine (NORVASC) 10 MG tablet Take 10 mg by mouth every morning.     Marland Kitchen aspirin 325 MG EC tablet Take 325 mg by mouth daily.    . clotrimazole-betamethasone (LOTRISONE) cream APPLY 3 TIMES A DAY TO RASH  99  . furosemide (LASIX) 40 MG tablet Take 40 mg by mouth every morning.     . naproxen sodium (ANAPROX) 550 MG tablet Take 550 mg by mouth 3 (three) times daily as needed.  0  . pantoprazole (PROTONIX) 40 MG tablet Take 40 mg by mouth daily.    . tadalafil (CIALIS) 5 MG tablet Take 5 mg by mouth daily as needed for erectile dysfunction.    Marland Kitchen telmisartan (MICARDIS) 80 MG tablet Take 80 mg by mouth every morning.     No facility-administered medications prior to visit.    PAST MEDICAL HISTORY: Past Medical History  Diagnosis Date  . Difficulty urinating     prostate problem  . Hypertension     resolved with lap band 2 years ago  . Sleep apnea     uses cpap setting is 12  . History of elevated glucose     IN THE PAST - NO PROBLEMS SINCE GASTRIC BANDING WEIGHT LOSS   . GERD (gastroesophageal reflux disease)     HIATAL HERNIA REPAIRED -with lap band 2 years ago NO LONGER HAS GERD    PAST SURGICAL HISTORY: Past Surgical History  Procedure Laterality Date  . Gastric restriction surgery  12/11/2008    lap  band  . Laparoscopic gastric banding  12/11/2008  . Cystoscopy w/ ureteral stent placement  07/18/2011    Procedure: CYSTOSCOPY WITH STENT REPLACEMENT;  Surgeon: Alexis Frock;  Location: [facility];  Service: Urology;  Laterality: Left;  . Colonoscopy  07/17/11  . Lithotripsy    . Hemorrhoid surgery N/A 08/04/2013    Procedure: EUA,HEMORRHOIDECTOMY;  Surgeon: [name], MD;  Location: [facility];  Service: General;  Laterality: N/A;    FAMILY HISTORY: Family History  Problem Relation Age of Onset  . Cancer Mother     ovarian  . Diabetes Mother   . Dementia Mother   . Other Father     MVA    SOCIAL  HISTORY:  Social History   Social History  . Marital Status: Married    Spouse Name: [name]  . Number of Children: 2  . Years of Education: 12   Occupational History  .      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 Smoking status: Former Smoker -- 1.50 packs/day for 9 years    Types: Cigarettes    Quit date: 04/05/1971  . Smokeless tobacco: Former Systems developer  . Alcohol Use: No     Comment: occas wine  . Drug Use: No  . Sexual Activity: Not on file   Other Topics Concern  . Not on file   Social History Narrative   Married, lives at home with wife   Caffeine use- coffee 2 cups daily     PHYSICAL EXAM  GENERAL EXAM/CONSTITUTIONAL: Vitals:  Filed Vitals:   05/21/15 1315  BP: 115/65  Pulse: 62  Height: 6' (1.829 m)  Weight: 265 lb (120.203 kg)   Body mass index is 35.93 kg/(m^2). No exam data present  Patient is in no distress; well developed, nourished and groomed; neck is supple   CARDIOVASCULAR:  Examination of carotid arteries is normal; no carotid bruits  Regular rate and rhythm, no murmurs  Examination of peripheral vascular system by observation and palpation is normal  EYES:  Ophthalmoscopic exam of optic discs and posterior segments is normal; no papilledema or hemorrhages  MUSCULOSKELETAL:  Gait, strength, tone, movements noted in Neurologic exam below  NEUROLOGIC: MENTAL STATUS:  No flowsheet data found.  awake, alert, oriented to person, place and time  recent and remote memory intact  normal attention and concentration  language fluent, comprehension intact, naming intact,   fund of knowledge appropriate  CRANIAL NERVE:   2nd - no papilledema on fundoscopic exam  2nd, 3rd, 4th, 6th - pupils equal and reactive to light, visual fields full to confrontation, extraocular muscles intact, no nystagmus  5th - facial sensation symmetric  7th - facial strength symmetric  8th - hearing intact  9th - palate  elevates symmetrically, uvula midline  11th - shoulder shrug symmetric  12th - tongue protrusion midline  MOTOR:   normal bulk and tone, full strength in the BUE, BLE  SENSORY:   normal and symmetric to light touch, temperature, vibration  COORDINATION:   finger-nose-finger, fine finger movements normal  REFLEXES:   deep tendon reflexes TRACE and symmetric  GAIT/STATION:   narrow based gait; romberg is negative    DIAGNOSTIC DATA (LABS, IMAGING, TESTING) - I reviewed patient records, labs, notes, testing and imaging myself where available.  Lab Results  Component Value Date   WBC 6.2 07/31/2013   HGB 13.4 07/31/2013   HCT 39.6 07/31/2013   MCV 93.8 07/31/2013   PLT  174 07/31/2013      Component Value Date/Time   NA 143 07/31/2013 0845   K 4.3 07/31/2013 0845   CL 103 07/31/2013 0845   CO2 28 07/31/2013 0845   GLUCOSE 117* 07/31/2013 0845   BUN 13 07/31/2013 0845   CREATININE 0.73 07/31/2013 0845   CALCIUM 8.6 07/31/2013 0845   PROT 7.5 12/06/2008 1006   ALBUMIN 4.2 12/06/2008 1006   AST 23 12/06/2008 1006   ALT 26 12/06/2008 1006   ALKPHOS 40 12/06/2008 1006   BILITOT 0.6 12/06/2008 1006   GFRNONAA >90 07/31/2013 0845   GFRAA >90 07/31/2013 0845   No results found for: CHOL, HDL, LDLCALC, LDLDIRECT, TRIG, CHOLHDL No results found for: HGBA1C No results found for: VITAMINB12 No results found for: TSH   03/08/15 EMG/NCS 1. Electrodiagnostic evidence of right L5, S1 radiculopathies. 2. Bilateral lower lumbar root irritation noted on paraspinal needle EMG.  05/10/15 MRI lumbar spine [I reviewed images myself and agree with interpretation. At L4-5 the left ward disc herniation is likely asymptomatic. However I think he is symptomatic from spinal stenosis at L3-4. -VRP]  1. At L4-L5, there is a large left disc herniation that feels the lateral recess and severe loss of disc height, endplate spurring, moderate facet hypertrophy. There is compression of the  left L5 nerve root there is distorsion of the left S1 nerve root. Additionally, there is some lesser encroachment upon the exiting L4 and the right L5 nerve root. 2. At L3-L4, there is moderate spinal stenosis, transverse more than AP, and severe facet hypertrophy causing mild anterolisthesis. There is no definite nerve root compression though there is some encroachment upon the left L3 and left L4 nerve roots 3.Moderate degenerative changes at the other lumbar levels do not lead to encroachment or compression of the nerve roots. 4.The L5 vertebral body appears to be sacralized. This can be confirmed on plain x-rays.  05/01/15 carotid u/s  - no stenosis  04/29/15 MRI brain (without) demonstrating: 1. Scattered periventricular and subcortical chronic small vessel ischemic disease. 2. No acute findings.  04/29/15 EEG - normal    ASSESSMENT AND PLAN  69 y.o. year old male here with intermittent right foot numbness and tremor, particularly with standing position, most likely related to lumbar radiculopathy and spinal stenosis.    Dx:  Spinal stenosis, lumbar region, with neurogenic claudication  Bilateral low back pain with sciatica, sciatica laterality unspecified    PLAN: - continue PT evaluation - consider neurosurgery/spine evaluation; patient wants to hold off at this time  Return if symptoms worsen or fail to improve, for return to PCP.    Penni Bombard, MD [id number], [id number] PM Certified in Neurology, Neurophysiology and Neuroimaging  [facility] [address], [place], [place] [address] [phone]

## 2015-06-13 ENCOUNTER — Encounter: Payer: Self-pay | Admitting: Rehabilitation

## 2015-06-13 ENCOUNTER — Ambulatory Visit: Payer: Medicare Other | Attending: Diagnostic Neuroimaging | Admitting: Rehabilitation

## 2015-06-13 DIAGNOSIS — R208 Other disturbances of skin sensation: Secondary | ICD-10-CM | POA: Diagnosis not present

## 2015-06-13 DIAGNOSIS — M545 Low back pain, unspecified: Secondary | ICD-10-CM

## 2015-06-13 DIAGNOSIS — Z23 Encounter for immunization: Secondary | ICD-10-CM | POA: Diagnosis not present

## 2015-06-13 DIAGNOSIS — R200 Anesthesia of skin: Secondary | ICD-10-CM

## 2015-06-13 NOTE — Patient Instructions (Signed)
   Hamstring stretch with foot on stool  While standing, place foot on stool or elevated surface.  Keep the foot of the leg that is being stretched in an upward facing direction and the hip in a neutral position.  Gently lean forward at the hip while keeping the spine in a neutral position.  Keeping the hip in a neutral position stretches the hamstrings globally while placing the hip in an externally rotated position stretches the medial hamstrings and an internally rotated position stretches the lateral hamstring.  Make sure that you hold position for 60 secs, do 2 times, twice a day.    Gastroc / Heel Cord Stretch - Seated With Towel    Sit in chair and place one leg up in another chair in front of you, wrap towel around ball of foot. Gently pull foot in toward body, stretching heel cord and calf. Hold for _60__ seconds. Repeat on involved leg. Repeat _2__ times. Do _2__ times per day.  Copyright  VHI. All rights reserved.     PIRIFORMIS AND HIP STRETCH - SEATED  While sitting in a chair, cross your affected leg on top of the other as shown.   Next, gently lean forward until a stretch is felt along the crossed leg. Hold for 60 secs and perform 2x, twice a day.   Pelvic Tilt: Posterior - Legs Bent (Supine)    Tighten stomach and flatten back by rolling pelvis down. Hold ___5_ seconds. Relax. Repeat __10__ times per set. Do __1__ sets per session. Do ___2_ sessions per day.  http://orth.exer.us/202   Copyright  VHI. All rights reserved.   Bracing With Bridging (Hook-Lying)    With neutral spine, tighten pelvic floor and abdominals and hold. Lift bottom. Repeat _10__ times. Do _2__ times a day.   Copyright  VHI. All rights reserved.      Multifidus Exercise Prone  Lying on your stomach, keep stomach muscles activated, lift right leg and left arm keeping the back stable and without shifting your weight. Alternate. Perform x 10 reps on each side, do 2 times a day.      Bent Knee Lift (Prone)    Abdomen and head supported, bend left knee and slowly raise hip. Avoid arching low back. Repeat __10__ times per set. Do ___1_ sets per session. Do _2___ sessions per day.  http://orth.exer.us/1110   Copyright  VHI. All rights reserved.

## 2015-06-14 NOTE — Therapy (Signed)
[facility] [address] [place], [place], [address] Phone: [phone]   Fax:  [phone]  Physical Therapy Evaluation  Patient Details  Name: [name] [name] MRN: [id number] Date of Birth: [birth year] Referring Provider: Andrey Spearman, MD  Encounter Date: 06/13/2015      PT End of Session - 06/14/15 0806    Visit Number 1   Number of Visits 2  follow up only if needed   Date for PT Re-Evaluation 07/14/15   PT Start Time 1315   PT Stop Time 1400   PT Time Calculation (min) 45 min   Activity Tolerance Patient tolerated treatment well   Behavior During Therapy [facility] for tasks assessed/performed      Past Medical History  Diagnosis Date  . Difficulty urinating     prostate problem  . Hypertension     resolved with lap band 2 years ago  . Sleep apnea     uses cpap setting is 12  . History of elevated glucose     IN THE PAST - NO PROBLEMS SINCE GASTRIC BANDING WEIGHT LOSS   . GERD (gastroesophageal reflux disease)     HIATAL HERNIA REPAIRED -with lap band 2 years ago NO LONGER HAS GERD    Past Surgical History  Procedure Laterality Date  . Gastric restriction surgery  12/11/2008    lap band  . Laparoscopic gastric banding  12/11/2008  . Cystoscopy w/ ureteral stent placement  07/18/2011    Procedure: CYSTOSCOPY WITH STENT REPLACEMENT;  Surgeon: Alexis Frock;  Location: [facility];  Service: Urology;  Laterality: Left;  . Colonoscopy  07/17/11  . Lithotripsy    . Hemorrhoid surgery N/A 08/04/2013    Procedure: EUA,HEMORRHOIDECTOMY;  Surgeon: [name], MD;  Location: [facility];  Service: General;  Laterality: N/A;    There were no vitals filed for this visit.  Visit Diagnosis:  Numbness of foot - Plan: PT plan of care cert/re-cert  Intermittent low back pain - Plan: PT plan of care cert/re-cert      Subjective Assessment - 06/13/15 1323    Subjective "This all started with my foot going numb on the R  and it also does a weird twitching movement, but this does not always happen, just every now and then." "The doctor has run tests and there is nothing wrong with my brain, but they did find some problems in my back."   Limitations Standing;Walking   Patient Stated Goals "to do what I need to do to be independent"    Currently in Pain? No/denies            [facility] PT Assessment - 06/14/15 0001    Assessment   Medical Diagnosis foot numbness   Onset Date/Surgical Date 09/09/14  per pt report   Precautions   Precautions None   Restrictions   Weight Bearing Restrictions No   Home Environment   Living Environment Private residence   Living Arrangements Spouse/significant other   Available Help at Discharge Family;Available 24 hours/day   Type of Home House   Home Access Stairs to enter   Entrance Stairs-Number of Steps 4   Home Layout Two level;Able to live on main level with bedroom/bathroom   Home Equipment None   Prior Function   Level of Independence Independent   Vocation Retired   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, riding Special educational needs teacher   Overall Cognitive Status  Within Functional Limits for tasks assessed   Sensation   Light Touch Appears Intact  intermittent numbness, difficult to predict, happens standin   Coordination   Gross Motor Movements are Fluid and Coordinated Yes   Fine Motor Movements are Fluid and Coordinated Yes   AROM   Overall AROM  Within functional limits for tasks performed;Other (comment)   Overall AROM Comments Pt with increased pain at R side (approx L3/4) with R lateral side bend, as well as with backwards extension>forwards flexion   Strength   Overall Strength Within functional limits for tasks performed   Transfers   Transfers Sit to Stand;Stand to Sit   Sit to Stand 7: Independent   Stand to Sit 7: Independent   Ambulation/Gait   Ambulation/Gait Yes    Ambulation/Gait Assistance 7: Independent          See pt instruction for details on HEP provided during session.  Tolerated exercises well with slight increased in "stiffness."                  PT Education - 06/14/15 0806    Education provided Yes   Education Details Evaluation findings, POC, follow up   Person(s) Educated Patient   Methods Explanation;Handout   Comprehension Verbalized understanding             PT Long Term Goals - 06/14/15 0956    PT LONG TERM GOAL #1   Title Pt will be independent with HEP for general core and back strengthening program.  (Target Date:  on follow up visit)   Baseline Initiated during evaluation   Status Partially Met   PT LONG TERM GOAL #2   Title Pt will report decreased pain with trunk ROM indicating improved flexibility and strength for improved mobility.  (Target Date: on follow up visit)               Plan - 06/14/15 0807    Clinical Impression Statement Pt presents with history of R foot numbess and involuntary movement that happens intermittently, is unpredictable, but happens when he is standing usually.  Pt has no pain associated with this issue.  Attempted to provoke symptoms during session, however unable to do so, but pt with slight increase in pain on R side with forward/backwards bending.  Provided general core and extension exercises during session as well as stretching exercises to increase BLE flexiblity.  See pt instructions.  Scheduled single follow up session with education to cancel if pt feels no follow up needed or to return to clinic if still feeling pain in back.    Pt will benefit from skilled therapeutic intervention in order to improve on the following deficits Pain;Impaired flexibility   Rehab Potential Excellent   PT Frequency Other (comment)  eval and single follow up visit   PT Treatment/Interventions Functional mobility training;Therapeutic exercise;Therapeutic activities   PT Next  Visit Plan ensure compliance with HEP, add strengthening as needed, D/C   PT Home Exercise Plan see pt instruction   Consulted and Agree with Plan of Care Patient          G-Codes - 06/14/15 0958    Functional Assessment Tool Used clinical judgement   Functional Limitation Mobility: Walking and moving around   Mobility: Walking and Moving Around Current Status (G8978) At least 1 percent but less than 20 percent impaired, limited or restricted   Mobility: Walking and Moving Around Goal Status (G8979) 0 percent impaired, limited or restricted         Problem List Patient Active Problem List   Diagnosis Date Noted  . Hemorrhoids 07/26/2013  . Left Ureteral stone 07/18/2011  . Pyelonephritis 07/18/2011  . Anal fissure 06/17/2011  . History of laparoscopic adjustable gastric banding 06/17/2011     , PT, MPT [facility] [address] [place], [place], [address] Phone: [phone]   Fax:  [phone] 06/14/2015, 10:01 AM  Name: [name] [name] MRN: [id number] Date of Birth: 06/15/1946    

## 2015-06-26 ENCOUNTER — Ambulatory Visit: Payer: Medicare Other | Admitting: Rehabilitation

## 2015-07-15 ENCOUNTER — Ambulatory Visit: Payer: Medicare Other | Admitting: Physical Therapy

## 2015-07-17 ENCOUNTER — Ambulatory Visit: Payer: Medicare Other | Attending: Diagnostic Neuroimaging | Admitting: Rehabilitation

## 2015-07-17 ENCOUNTER — Encounter: Payer: Self-pay | Admitting: Rehabilitation

## 2015-07-17 DIAGNOSIS — M545 Low back pain, unspecified: Secondary | ICD-10-CM

## 2015-07-17 DIAGNOSIS — R208 Other disturbances of skin sensation: Secondary | ICD-10-CM | POA: Diagnosis not present

## 2015-07-17 DIAGNOSIS — R200 Anesthesia of skin: Secondary | ICD-10-CM

## 2015-07-17 NOTE — Patient Instructions (Signed)
   Hamstring stretch with foot on stool  While standing, place foot on stool or elevated surface. Keep the foot of the leg that is being stretched in an upward facing direction and the hip in a neutral position. Gently lean forward at the hip while keeping the spine in a neutral position. Keeping the hip in a neutral position stretches the hamstrings globally while placing the hip in an externally rotated position stretches the medial hamstrings and an internally rotated position stretches the lateral hamstring. Make sure that you hold position for 60 secs, do 2 times, twice a day.   Gastroc / Heel Cord Stretch - Seated With Towel    Sit in chair and place one leg up in another chair in front of you, wrap towel around ball of foot. Gently pull foot in toward body, stretching heel cord and calf. Hold for _60__ seconds. Repeat on involved leg. Repeat _2__ times. Do _2__ times per day.  Copyright  VHI. All rights reserved.     PIRIFORMIS AND HIP STRETCH - SEATED  While sitting in a chair, cross your affected leg on top of the other as shown.   Next, gently lean forward until a stretch is felt along the crossed leg. Hold for 60 secs and perform 2x, twice a day.   Pelvic Tilt: Posterior - Legs Bent (Supine)    Tighten stomach and flatten back by rolling pelvis down. Hold ___5_ seconds. Relax. Repeat __10__ times per set. Do __1__ sets per session. Do ___2_ sessions per day.  http://orth.exer.us/202   Copyright  VHI. All rights reserved.   Bracing With Bridging (Hook-Lying)    With neutral spine, tighten pelvic floor and abdominals and hold. Lift bottom. Repeat _10__ times. Do _2__ times a day.   Copyright  VHI. All rights reserved.     Multifidus Exercise Prone  Lying on your stomach, keep stomach muscles activated, lift right leg and left arm keeping the back stable and without shifting your weight. Alternate. Perform x 10 reps on each side, do 2 times a day.     Bent Knee Lift (Prone)    Abdomen and head supported, bend left knee and slowly raise hip. Avoid arching low back. Repeat __10__ times per set. Do ___1_ sets per session. Do _2___ sessions per day.  http://orth.exer.us/1110   Copyright  VHI. All rights reserved.     Alternating Leg Extension (Quadruped)  Begin on hands and knees with knees directly under your hips and hands directly under your shoulders. Keep your abdominals tight and engaged throughout this exercise. Raise one leg straight back as pictured without letting your hips drop to one side and without losing your abdominal contraction. Hold for 3 seconds, then return to the start position and repeat with the opposite leg. This is one repetition.  Repeat x 10 on each side.  Do 2 times a day if you can.

## 2015-07-17 NOTE — Therapy (Signed)
[facility] [address] [place], [place], [address] Phone: [phone]   Fax:  [phone]  Physical Therapy Treatment  Patient Details  Name: [name] [name] MRN: [id number] Date of Birth: Oct 02, 1945 Referring Provider: Andrey Spearman, MD  Encounter Date: 07/17/2015      PT End of Session - 07/17/15 1112    Visit Number 2   Number of Visits 4  per updated POC   Date for PT Re-Evaluation 08/31/15   PT Start Time 1107   PT Stop Time 1146   PT Time Calculation (min) 39 min   Activity Tolerance Patient tolerated treatment well   Behavior During Therapy [facility] for tasks assessed/performed      Past Medical History  Diagnosis Date  . Difficulty urinating     prostate problem  . Hypertension     resolved with lap band 2 years ago  . Sleep apnea     uses cpap setting is 12  . History of elevated glucose     IN THE PAST - NO PROBLEMS SINCE GASTRIC BANDING WEIGHT LOSS   . GERD (gastroesophageal reflux disease)     HIATAL HERNIA REPAIRED -with lap band 2 years ago NO LONGER HAS GERD    Past Surgical History  Procedure Laterality Date  . Gastric restriction surgery  12/11/2008    lap band  . Laparoscopic gastric banding  12/11/2008  . Cystoscopy w/ ureteral stent placement  07/18/2011    Procedure: CYSTOSCOPY WITH STENT REPLACEMENT;  Surgeon: Alexis Frock;  Location: [facility];  Service: Urology;  Laterality: Left;  . Colonoscopy  07/17/11  . Lithotripsy    . Hemorrhoid surgery N/A 08/04/2013    Procedure: EUA,HEMORRHOIDECTOMY;  Surgeon: [name], MD;  Location: [facility];  Service: General;  Laterality: N/A;    There were no vitals filed for this visit.  Visit Diagnosis:  Numbness of foot - Plan: PT plan of care cert/re-cert  Intermittent low back pain - Plan: PT plan of care cert/re-cert      Subjective Assessment - 07/17/15 1111    Subjective "I haven't really been doing my exercises, so I'm here today  with my wife so that she can see them and help me with them at home."   Limitations Standing;Walking   Patient Stated Goals "to do what I need to do to be independent"    Currently in Pain? No/denies                TE:  Went through all of pts initial HEP for flexibility and core/back strength.  See pt instruction for full details.  Wife observing to improve carryover and technique during exercises. Added quadruped activity to progress strengthening for core, see pt instruction.  Tolerated well with moderate fatigue.  Will plan to see for 2 more visits to ensure exercises going well and add strengthening as needed.  Pt and wife verbalized understanding.                   PT Education - 07/17/15 1112    Education provided Yes   Education Details appropriate technique for HEP, addition to HEP   Person(s) Educated Patient;Spouse   Methods Explanation;Demonstration;Handout   Comprehension Verbalized understanding;Returned demonstration             PT Long Term Goals - 07/17/15 1305    PT LONG TERM GOAL #1   Title Pt will be independent with HEP for general core and back strengthening  program.  (Modified Target Date:  08/31/15)   Baseline Initiated during evaluation   Status On-going   PT LONG TERM GOAL #2   Title Pt will report decreased pain with trunk ROM indicating improved flexibility and strength for improved mobility.  (Modified Target Date: 08/31/15)   Status On-going   PT LONG TERM GOAL #3   Title Pt will report return to leisure activity in order to indicate improved core strength and flexibility.  (Modified Target 08/31/15)   Status New               Plan - 07/17/15 1113    Clinical Impression Statement Skilled session focused on re-addressing HEP compliance and performance as he stated he has not done them at home.  Wife present to observe so that she can assist with carryover at home.  Added quadruped exercise, see pt instruction for full  details.    Pt will benefit from skilled therapeutic intervention in order to improve on the following deficits Pain;Impaired flexibility   Rehab Potential Excellent   PT Frequency 1x / week  for 2 more visits total   PT Duration --  for 2 more visits total   PT Treatment/Interventions Functional mobility training;Therapeutic exercise;Therapeutic activities   PT Next Visit Plan ensure compliance with HEP, add strengthening as needed   PT Home Exercise Plan see pt instruction   Consulted and Agree with Plan of Care Patient        Problem List Patient Active Problem List   Diagnosis Date Noted  . Hemorrhoids 07/26/2013  . Left Ureteral stone 07/18/2011  . Pyelonephritis 07/18/2011  . Anal fissure 06/17/2011  . History of laparoscopic adjustable gastric banding 06/17/2011    Cameron Sprang, PT, MPT [facility] [address] [place], [place], [address] Phone: [phone]   Fax:  [phone] 07/17/2015, 1:10 PM  Name: [name] [name] MRN: [id number] Date of Birth: 1946/02/21

## 2015-07-31 ENCOUNTER — Ambulatory Visit: Payer: Medicare Other | Admitting: Physical Therapy

## 2015-07-31 DIAGNOSIS — M545 Low back pain, unspecified: Secondary | ICD-10-CM

## 2015-07-31 DIAGNOSIS — R200 Anesthesia of skin: Secondary | ICD-10-CM

## 2015-07-31 DIAGNOSIS — R208 Other disturbances of skin sensation: Secondary | ICD-10-CM | POA: Diagnosis not present

## 2015-07-31 NOTE — Therapy (Signed)
[facility] [address] [place], [place], [address] Phone: [phone]   Fax:  [phone]  Physical Therapy Treatment  Patient Details  Name: [name] [name] MRN: [id number] Date of Birth: 13-Sep-1945 Referring Provider: Andrey Spearman, MD  Encounter Date: 07/31/2015      PT End of Session - 07/31/15 2053    Visit Number 3   Number of Visits 4   Date for PT Re-Evaluation 08/31/15   PT Start Time 0934   PT Stop Time 1016   PT Time Calculation (min) 42 min   Activity Tolerance Patient tolerated treatment well   Behavior During Therapy [facility] for tasks assessed/performed      Past Medical History  Diagnosis Date  . Difficulty urinating     prostate problem  . Hypertension     resolved with lap band 2 years ago  . Sleep apnea     uses cpap setting is 12  . History of elevated glucose     IN THE PAST - NO PROBLEMS SINCE GASTRIC BANDING WEIGHT LOSS   . GERD (gastroesophageal reflux disease)     HIATAL HERNIA REPAIRED -with lap band 2 years ago NO LONGER HAS GERD    Past Surgical History  Procedure Laterality Date  . Gastric restriction surgery  12/11/2008    lap band  . Laparoscopic gastric banding  12/11/2008  . Cystoscopy w/ ureteral stent placement  07/18/2011    Procedure: CYSTOSCOPY WITH STENT REPLACEMENT;  Surgeon: Alexis Frock;  Location: [facility];  Service: Urology;  Laterality: Left;  . Colonoscopy  07/17/11  . Lithotripsy    . Hemorrhoid surgery N/A 08/04/2013    Procedure: EUA,HEMORRHOIDECTOMY;  Surgeon: [name], MD;  Location: [facility];  Service: General;  Laterality: N/A;    There were no vitals filed for this visit.  Visit Diagnosis:  Numbness of foot  Intermittent low back pain      Subjective Assessment - 07/31/15 0936    Subjective "I've been working real hard. 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 Yes   Pain Score 4    Pain Location Back   Pain Orientation Lower   Pain Descriptors / Indicators Aching   Pain Type Chronic pain   Pain Onset More than a month ago   Aggravating Factors  heavy lifting   Pain Relieving Factors Aleve   Multiple Pain Sites No            [facility] PT Assessment - 07/31/15 0001    Observation/Other Assessments   Observations RLE symptoms not provoked or changed with change in spinal posiiton, repeated motions. Pt did report "catching" at leve of L3-4 with B thoracolumbar rotation.                      [facility] Adult PT Treatment/Exercise - 07/31/15 0001    Self-Care   Self-Care Other Self-Care Comments   Other Self-Care Comments  During rest breaks, explained spinal stenosis and provided visual diagram to explain reason for symptom increase in standing. Discussed importance of core muscle stabilization and LE stretching (provided during previous sessions) to support spine and prevent further degenerative/postural changes as well as worsening RLE numbness.  Strongly recommended daily HEP compliance to enable primary PT to make accurate determination as to whether PT is an effective treatment for pt. Pt verbalized understanding of education and agreed to perform home exercises daily until next session.   Exercises   Exercises Other Exercises   Other Exercises  With use of paper handout and with verbal/demo cueing from this PT, pt effectively performed all home exercises provided during prior PT sessions. Due to limited extensibility of B iliopsoas, added hip flexor self-stretch to HEP with effective return demo from pt. Decreased number of home exercises to promote pt compliance with HEP. Focused primarily flexion-based exercises due to h/o spinal stenosis and secondary to pt report of symptoms provoked  by prolonged standing and relieved by sitting.                 PT Education - 07/31/15 2040    Education provided Yes   Education Details HEP modification to promote pt compliance.   Person(s) Educated Patient   Methods Explanation;Demonstration;Verbal cues;Handout   Comprehension Verbalized understanding;Returned demonstration             PT Long Term Goals - 07/17/15 1305    PT LONG TERM GOAL #1   Title Pt will be independent with HEP for general core and back strengthening program.  (Modified Target Date:  08/31/15)   Baseline Initiated during evaluation   Status On-going   PT LONG TERM GOAL #2   Title Pt will report decreased pain with trunk ROM indicating improved flexibility and strength for improved mobility.  (Modified Target Date: 08/31/15)   Status On-going   PT LONG TERM GOAL #3   Title Pt will report return to leisure activity in order to indicate improved core strength and flexibility.  (Modified Target 08/31/15)   Status New               Plan - 07/31/15 2054    Clinical Impression Statement Pt again arrived to session with report of ongoing HEP noncompliance. Therefore,eductaed pt on spinal stenosis and importance of HEP compliance to ensure lumbar stability and prevent degenerative/postural changes as well as worsening RLE numbness. Decreased number of home exercises to promote pt compliance with HEP.    Pt will benefit from skilled therapeutic intervention in order to improve on the following deficits Pain;Impaired flexibility   Rehab Potential Excellent   PT Frequency 1x / week  2 more visits total   PT Duration Other (comment)  for 2 more visits total   PT Treatment/Interventions Functional mobility training;Therapeutic exercise;Therapeutic activities   PT Next Visit Plan Check HEP safety/compliance and plan to DC, if primary PT thinks appropriate.   PT Home Exercise Plan see pt instruction   Consulted and Agree with Plan of Care Patient         Problem List Patient Active Problem List   Diagnosis Date Noted  . Hemorrhoids 07/26/2013  . Left Ureteral stone 07/18/2011  . Pyelonephritis 07/18/2011  . Anal fissure 06/17/2011  . History of laparoscopic adjustable gastric banding 06/17/2011    Billie Ruddy, PT, DPT [facility] [address] [place], [place], [address] Phone: [phone]   Fax:  [phone] 07/31/2015, 9:04 PM  Name: JALAN KINA MRN: [id number] Date of Birth: [birth year]

## 2015-07-31 NOTE — Patient Instructions (Signed)
   Hamstring stretch with foot on stool  While standing, place foot on stool or elevated surface. Keep the foot of the leg that is being stretched in an upward facing direction and the hip in a neutral position. Gently lean forward at the hip while keeping the spine in a neutral position. Keeping the hip in a neutral position stretches the hamstrings globally while placing the hip in an externally rotated position stretches the medial hamstrings and an internally rotated position stretches the lateral hamstring. Make sure that you hold position for 60 secs, do 2 times, twice a day.   Gastroc / Heel Cord Stretch - Seated With Towel    Sit in chair and place one leg up in another chair in front of you, wrap towel around ball of foot. Gently pull foot in toward body, stretching heel cord and calf. Hold for _60__ seconds. Repeat on involved leg. Repeat _2__ times. Do _2__ times per day.  Copyright  VHI. All rights reserved.     PIRIFORMIS AND HIP STRETCH - SEATED  While sitting in a chair, cross your affected leg on top of the other as shown.   Next, gently lean forward until a stretch is felt along the crossed leg. Hold for 60 secs and perform 2x, twice a day.   Pelvic Tilt: Posterior - Legs Bent (Supine)    Tighten stomach and flatten back by rolling pelvis down. Hold ___5_ seconds. Relax. Repeat __10__ times per set. Do __1__ sets per session. Do ___2_ sessions per day.     Alternating Leg Extension (Quadruped)  Begin on hands and knees with knees directly under your hips and hands directly under your shoulders. Keep your abdominals tight and engaged throughout this exercise. Raise one leg straight back as pictured without letting your hips drop to one side and without losing your abdominal contraction. Hold for 3 seconds, then return to the start position and repeat with the opposite leg. This is one repetition. Repeat x 10 on each side. Do 2 times a day if you can.    Quads / HF, Supine    Lie near edge of bed, one leg bent, foot flat on bed. Other leg hanging over edge, relaxed, thigh hanging off edge of bed,. Bend hanging knee backward. You should feel a gentle stretch in the front of your hip and thigh. Hold __60_ seconds. Perform this stretch twice per day on each leg.  Copyright  VHI. All rights reserved.

## 2015-08-07 ENCOUNTER — Encounter: Payer: Self-pay | Admitting: Rehabilitation

## 2015-08-07 ENCOUNTER — Ambulatory Visit: Payer: Medicare Other | Attending: Diagnostic Neuroimaging | Admitting: Rehabilitation

## 2015-08-07 DIAGNOSIS — R208 Other disturbances of skin sensation: Secondary | ICD-10-CM | POA: Diagnosis not present

## 2015-08-07 DIAGNOSIS — M545 Low back pain, unspecified: Secondary | ICD-10-CM

## 2015-08-07 DIAGNOSIS — R200 Anesthesia of skin: Secondary | ICD-10-CM

## 2015-08-07 NOTE — Therapy (Signed)
[facility] [address] [place], [place], [address] Phone: [phone]   Fax:  [phone]  Physical Therapy Treatment and D/C Summary  Patient Details  Name: [name] [name] MRN: [id number] Date of Birth: 03-Apr-1946 Referring Provider: Andrey Spearman, MD  Encounter Date: 08/07/2015      PT End of Session - 08/07/15 0759    Visit Number 4   Number of Visits 4   Date for PT Re-Evaluation 08/31/15   PT Start Time 0800   PT Stop Time 0832  did not need full time, DC   PT Time Calculation (min) 32 min   Activity Tolerance Patient tolerated treatment well   Behavior During Therapy [facility] for tasks assessed/performed      Past Medical History  Diagnosis Date  . Difficulty urinating     prostate problem  . Hypertension     resolved with lap band 2 years ago  . Sleep apnea     uses cpap setting is 12  . History of elevated glucose     IN THE PAST - NO PROBLEMS SINCE GASTRIC BANDING WEIGHT LOSS   . GERD (gastroesophageal reflux disease)     HIATAL HERNIA REPAIRED -with lap band 2 years ago NO LONGER HAS GERD    Past Surgical History  Procedure Laterality Date  . Gastric restriction surgery  12/11/2008    lap band  . Laparoscopic gastric banding  12/11/2008  . Cystoscopy w/ ureteral stent placement  07/18/2011    Procedure: CYSTOSCOPY WITH STENT REPLACEMENT;  Surgeon: Alexis Frock;  Location: [facility];  Service: Urology;  Laterality: Left;  . Colonoscopy  07/17/11  . Lithotripsy    . Hemorrhoid surgery N/A 08/04/2013    Procedure: EUA,HEMORRHOIDECTOMY;  Surgeon: [name], MD;  Location: [facility];  Service: General;  Laterality: N/A;    There were no vitals filed for this visit.  Visit Diagnosis:  Intermittent low back pain  Numbness of foot      Subjective Assessment - 08/07/15 0803    Subjective "I've been working hard, my back has been sore, but I keep plugging away at it."     Limitations  Standing;Walking   Patient Stated Goals "to do what I need to do to be independent"    Currently in Pain? Yes   Pain Score 3    Pain Location Back   Pain Orientation Lower   Pain Descriptors / Indicators Aching   Pain Type Chronic pain   Pain Onset More than a month ago   Pain Frequency Intermittent   Aggravating Factors  following exercises, feels sore   Pain Relieving Factors aleve            Self Care:  Assessed goals during session with discussion of return to leisure activity.  Note that he states he plans to return to golf as soon as weather permits.  Recommended that he begin slow and not perform full 18 holes, maybe not even 9 holes due to increased trunk rotation required during golf.  Also continue to educate on importance of performing HEP to continue to improve trunk and core musculature for safe return to leisure activities.  Also discussed pain with trunk ROM.  Pt states that he does not really have very much pain with movement.    TA:  Performed repeated trunk movements in flexion, extension and rotation without any increase in pain, however did note slight discomfort in trunk lateral rotation.  Pt states that this  improved with repeated movements.    TE:  Performed B hip flex stretch x 2 sets of 60 secs on each side (supine with LE off edge of mat).  Provided light over pressure to increase amount of stretch and educated on performing from higher surface such as bed if needing increased stretch.                       PT Education - 2015-08-15 0759    Education provided Yes   Education Details continue to educate on importance of compliance with HEP   Person(s) Educated Patient   Methods Explanation   Comprehension Verbalized understanding             PT Long Term Goals - 08/15/2015 0806    PT LONG TERM GOAL #1   Title Pt will be independent with HEP for general core and back strengthening program.  (Modified Target Date:  08/31/15)   Baseline  met 08/15/2015   Status Achieved   PT LONG TERM GOAL #2   Title Pt will report decreased pain with trunk ROM indicating improved flexibility and strength for improved mobility.  (Modified Target Date: 08/31/15)   Baseline slight pain with lateral flexion in both directions, however reports improved from evaluation.  08-15-15   Status Achieved   PT LONG TERM GOAL #3   Title Pt will report return to leisure activity in order to indicate improved core strength and flexibility.  (Modified Target 08/31/15)   Baseline Reports he plans to return to golf when weather permits.  08/15/2015   Status Achieved               Plan - Aug 15, 2015 0759    Clinical Impression Statement Skilled session focused on addressing LTG's and D/C.  Pt reports that he has been doing exercises more regularly and wife has been assisting as needed.  He has met all 3 LTG's and feels ready for D/C today.  Reviewed hip flexor stretch during session with min cues for transitioning to higher surface to increase amount of stretch.  Pt verbalized understanding.    Pt will benefit from skilled therapeutic intervention in order to improve on the following deficits Pain;Impaired flexibility   Rehab Potential Excellent   PT Frequency 1x / week  2 more visits total   PT Duration Other (comment)  for 2 more visits total   PT Treatment/Interventions Functional mobility training;Therapeutic exercise;Therapeutic activities;ADLs/Self Care Home Management   PT Next Visit Plan Check HEP safety/compliance and plan to DC, if primary PT thinks appropriate.   PT Home Exercise Plan see pt instruction   Consulted and Agree with Plan of Care Patient          G-Codes - 08/15/15 0836    Functional Assessment Tool Used clinical judgement, pt report   Functional Limitation Mobility: Walking and moving around   Mobility: Walking and Moving Around Current Status [phone]) At least 1 percent but less than 20 percent impaired, limited or restricted    Mobility: Walking and Moving Around Goal Status [phone]) 0 percent impaired, limited or restricted   Mobility: Walking and Moving Around Discharge Status [phone]) 0 percent impaired, limited or restricted  Continues to have very mild pain in low back, but does not limit pts activity      PHYSICAL THERAPY DISCHARGE SUMMARY  Visits from Start of Care: 4  Current functional level related to goals / functional outcomes: See LTG's above   Remaining deficits: Continues to  have some mild discomfort in low back, however feel that it is due to increased activation of core muscles during exercises.  This does not limit his activity.    Education / Equipment: HEP, education to return slowly to leisure activities.   Plan: Patient agrees to discharge.  Patient goals were met. Patient is being discharged due to meeting the stated rehab goals.  ?????         Problem List Patient Active Problem List   Diagnosis Date Noted  . Hemorrhoids 07/26/2013  . Left Ureteral stone 07/18/2011  . Pyelonephritis 07/18/2011  . Anal fissure 06/17/2011  . History of laparoscopic adjustable gastric banding 06/17/2011    Cameron Sprang, PT, MPT [facility] [address] [place], [place], [address] Phone: [phone]   Fax:  [phone] 08/07/2015, 8:38 AM  Name: JOFFREY KERCE MRN: [id number] Date of Birth: 1945/10/29

## 2015-08-12 DIAGNOSIS — Z125 Encounter for screening for malignant neoplasm of prostate: Secondary | ICD-10-CM | POA: Diagnosis not present

## 2015-08-12 DIAGNOSIS — E118 Type 2 diabetes mellitus with unspecified complications: Secondary | ICD-10-CM | POA: Diagnosis not present

## 2015-08-12 DIAGNOSIS — I10 Essential (primary) hypertension: Secondary | ICD-10-CM | POA: Diagnosis not present

## 2015-08-12 DIAGNOSIS — Z79899 Other long term (current) drug therapy: Secondary | ICD-10-CM | POA: Diagnosis not present

## 2015-08-12 DIAGNOSIS — E789 Disorder of lipoprotein metabolism, unspecified: Secondary | ICD-10-CM | POA: Diagnosis not present

## 2015-08-20 DIAGNOSIS — D649 Anemia, unspecified: Secondary | ICD-10-CM | POA: Diagnosis not present

## 2015-08-20 DIAGNOSIS — I10 Essential (primary) hypertension: Secondary | ICD-10-CM | POA: Diagnosis not present

## 2015-08-20 DIAGNOSIS — N400 Enlarged prostate without lower urinary tract symptoms: Secondary | ICD-10-CM | POA: Diagnosis not present

## 2015-09-04 DIAGNOSIS — Z9884 Bariatric surgery status: Secondary | ICD-10-CM | POA: Diagnosis not present

## 2015-09-04 DIAGNOSIS — R195 Other fecal abnormalities: Secondary | ICD-10-CM | POA: Diagnosis not present

## 2015-09-04 DIAGNOSIS — Z86010 Personal history of colonic polyps: Secondary | ICD-10-CM | POA: Diagnosis not present

## 2015-09-04 DIAGNOSIS — D649 Anemia, unspecified: Secondary | ICD-10-CM | POA: Diagnosis not present

## 2015-10-11 ENCOUNTER — Other Ambulatory Visit: Payer: Self-pay | Admitting: Gastroenterology

## 2015-10-11 DIAGNOSIS — Z86010 Personal history of colonic polyps: Secondary | ICD-10-CM | POA: Diagnosis not present

## 2015-10-11 DIAGNOSIS — R195 Other fecal abnormalities: Secondary | ICD-10-CM | POA: Diagnosis not present

## 2015-10-11 DIAGNOSIS — D122 Benign neoplasm of ascending colon: Secondary | ICD-10-CM | POA: Diagnosis not present

## 2015-10-11 DIAGNOSIS — K209 Esophagitis, unspecified: Secondary | ICD-10-CM | POA: Diagnosis not present

## 2015-10-11 DIAGNOSIS — D509 Iron deficiency anemia, unspecified: Secondary | ICD-10-CM | POA: Diagnosis not present

## 2015-10-11 DIAGNOSIS — K644 Residual hemorrhoidal skin tags: Secondary | ICD-10-CM | POA: Diagnosis not present

## 2015-10-11 DIAGNOSIS — K5730 Diverticulosis of large intestine without perforation or abscess without bleeding: Secondary | ICD-10-CM | POA: Diagnosis not present

## 2015-10-22 ENCOUNTER — Ambulatory Visit (INDEPENDENT_AMBULATORY_CARE_PROVIDER_SITE_OTHER): Payer: Self-pay | Admitting: Physician Assistant

## 2015-10-22 VITALS — BP 122/70 | HR 61 | Temp 97.7°F | Resp 16 | Ht 71.0 in | Wt 264.4 lb

## 2015-10-22 DIAGNOSIS — Z0289 Encounter for other administrative examinations: Secondary | ICD-10-CM

## 2015-10-22 DIAGNOSIS — Z021 Encounter for pre-employment examination: Secondary | ICD-10-CM

## 2015-10-22 NOTE — Progress Notes (Signed)
[facility] [address], [place] [address] [phone]  Date:  10/22/2015   Name:  [name] [name]   DOB:  Oct 25, 1945   MRN:  [id number]  PCP:  Dwan Bolt, MD    History of Present Illness:  [name] [name] is a 70 y.o. 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 He has no concerns or complaints at this time. He is compliant on his blood pressure medications, and well controlled. Passed a kidney stone 2012--no reoccurence. Hx of sleep apnea, where he is compliant on cpap, and followed within the last 6 months. 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  He has not had to utilize this within the year.     Patient Active Problem List   Diagnosis Date Noted  . Hemorrhoids 07/26/2013  . Left Ureteral stone 07/18/2011  . Pyelonephritis 07/18/2011  . Anal fissure 06/17/2011  . History of laparoscopic adjustable gastric banding 06/17/2011    Past Medical History  Diagnosis Date  . Difficulty urinating     prostate problem  . Hypertension     resolved with lap band 2 years ago  . Sleep apnea     uses cpap setting is 12  . History of elevated glucose     IN THE PAST - NO PROBLEMS SINCE GASTRIC BANDING WEIGHT LOSS   . GERD (gastroesophageal reflux disease)     HIATAL HERNIA REPAIRED -with lap band 2 years ago NO LONGER HAS GERD    Past Surgical History  Procedure Laterality Date  . Gastric restriction surgery  12/11/2008    lap band  . Laparoscopic gastric banding  12/11/2008  . Cystoscopy w/ ureteral stent placement  07/18/2011    Procedure: CYSTOSCOPY WITH STENT REPLACEMENT;  Surgeon: Alexis Frock;  Location: [facility];  Service: Urology;  Laterality: Left;  . Colonoscopy  07/17/11  . Lithotripsy    . Hemorrhoid surgery N/A 08/04/2013    Procedure: EUA,HEMORRHOIDECTOMY;  Surgeon: [name], MD;  Location: [facility];  Service: General;  Laterality: N/A;    Social History  Substance Use Topics  . Smoking status: Former  Smoker -- 1.50 packs/day for 9 years    Types: Cigarettes    Quit date: 04/05/1971  . Smokeless tobacco: Former Systems developer  . Alcohol Use: No     Comment: occas wine    Family History  Problem Relation Age of Onset  . Cancer Mother     ovarian  . Diabetes Mother   . Dementia Mother   . Other Father     MVA    No Known Allergies  Medication list has been reviewed and updated.  Current Outpatient Prescriptions on File Prior to Visit  Medication Sig Dispense Refill  . alfuzosin (UROXATRAL) 10 MG 24 hr tablet Take 10 mg by mouth daily.     Marland Kitchen amLODipine (NORVASC) 10 MG tablet Take 10 mg by mouth every morning.     Marland Kitchen aspirin 325 MG EC tablet Take 325 mg by mouth daily.    . furosemide (LASIX) 40 MG tablet Take 40 mg by mouth every morning.     . naproxen sodium (ANAPROX) 550 MG tablet Take 550 mg by mouth 3 (three) times daily as needed.  0  . pantoprazole (PROTONIX) 40 MG tablet Take 40 mg by mouth daily.    . tadalafil (CIALIS) 5 MG tablet Take 5 mg by mouth daily as needed for erectile dysfunction.    Marland Kitchen  telmisartan (MICARDIS) 80 MG tablet Take 80 mg by mouth every morning.    . clotrimazole-betamethasone (LOTRISONE) cream Reported on 10/22/2015  99   No current facility-administered medications on file prior to visit.    Review of Systems  Constitutional: Negative for fever and chills.  HENT: Negative for ear discharge, ear pain and sore throat.   Eyes: Negative for blurred vision and double vision.  Respiratory: Negative for cough, shortness of breath and wheezing.   Cardiovascular: Negative for chest pain, palpitations and leg swelling.  Gastrointestinal: Negative for nausea, vomiting and diarrhea.  Genitourinary: Negative for dysuria, frequency and hematuria.  Skin: Negative for itching and rash.  Neurological: Negative for dizziness and headaches.     Physical Examination: BP 122/70 mmHg  Pulse 61  Temp(Src) 97.7 F (36.5 C) (Oral)  Resp 16  Ht 5\' 11"  (1.803 m)  Wt  264 lb 6.4 oz (119.931 kg)  BMI 36.89 kg/m2  SpO2 95% Ideal Body Weight: Weight in (lb) to have BMI = 25: 178.9  Physical Exam  Constitutional: He is oriented to person, place, and time. He appears well-developed and well-nourished. No distress.  HENT:  Head: Normocephalic and atraumatic.  Right Ear: Tympanic membrane, external ear and ear canal normal.  Left Ear: Tympanic membrane, external ear and ear canal normal.  Eyes: Conjunctivae and EOM are normal. Pupils are equal, round, and reactive to light.  Cardiovascular: Normal rate and regular rhythm.  Exam reveals no friction rub.   No murmur heard. Pulmonary/Chest: Effort normal. No respiratory distress. He has no wheezes.  Abdominal: Soft. Bowel sounds are normal. He exhibits no distension and no mass. There is no tenderness.  Musculoskeletal: Normal range of motion. He exhibits no edema or tenderness.  Neurological: He is alert and oriented to person, place, and time. He displays normal reflexes.  Skin: Skin is warm and dry. He is not diaphoretic.  Psychiatric: He has a normal mood and affect. His behavior is normal.     Assessment and Plan: [name] [name] is a 70 y.o. male who is here today for DOT exam. Forms completed. Certified for 1 year.    Encounter for examination required by Department of Transportation (DOT)    Ivar Drape, PA-C [facility] 10/22/2015 10:25 AM

## 2015-10-25 DIAGNOSIS — M545 Low back pain: Secondary | ICD-10-CM | POA: Diagnosis not present

## 2015-10-25 DIAGNOSIS — Z0000 Encounter for general adult medical examination without abnormal findings: Secondary | ICD-10-CM | POA: Diagnosis not present

## 2015-10-25 DIAGNOSIS — R3915 Urgency of urination: Secondary | ICD-10-CM | POA: Diagnosis not present

## 2016-01-20 DIAGNOSIS — L219 Seborrheic dermatitis, unspecified: Secondary | ICD-10-CM | POA: Diagnosis not present

## 2016-01-20 DIAGNOSIS — L821 Other seborrheic keratosis: Secondary | ICD-10-CM | POA: Diagnosis not present

## 2016-02-10 DIAGNOSIS — N400 Enlarged prostate without lower urinary tract symptoms: Secondary | ICD-10-CM | POA: Diagnosis not present

## 2016-02-20 DIAGNOSIS — N400 Enlarged prostate without lower urinary tract symptoms: Secondary | ICD-10-CM | POA: Diagnosis not present

## 2016-02-20 DIAGNOSIS — I10 Essential (primary) hypertension: Secondary | ICD-10-CM | POA: Diagnosis not present

## 2016-03-10 DIAGNOSIS — R972 Elevated prostate specific antigen [PSA]: Secondary | ICD-10-CM | POA: Diagnosis not present

## 2016-04-16 DIAGNOSIS — R972 Elevated prostate specific antigen [PSA]: Secondary | ICD-10-CM | POA: Diagnosis not present

## 2016-04-22 DIAGNOSIS — D649 Anemia, unspecified: Secondary | ICD-10-CM | POA: Diagnosis not present

## 2016-04-22 DIAGNOSIS — Z86010 Personal history of colonic polyps: Secondary | ICD-10-CM | POA: Diagnosis not present

## 2016-04-22 DIAGNOSIS — R195 Other fecal abnormalities: Secondary | ICD-10-CM | POA: Diagnosis not present

## 2016-08-04 DIAGNOSIS — Z23 Encounter for immunization: Secondary | ICD-10-CM | POA: Diagnosis not present

## 2016-09-29 DIAGNOSIS — H52223 Regular astigmatism, bilateral: Secondary | ICD-10-CM | POA: Diagnosis not present

## 2016-09-29 DIAGNOSIS — H2513 Age-related nuclear cataract, bilateral: Secondary | ICD-10-CM | POA: Diagnosis not present

## 2016-09-29 DIAGNOSIS — I10 Essential (primary) hypertension: Secondary | ICD-10-CM | POA: Diagnosis not present

## 2016-09-29 DIAGNOSIS — H5203 Hypermetropia, bilateral: Secondary | ICD-10-CM | POA: Diagnosis not present

## 2016-09-29 DIAGNOSIS — H524 Presbyopia: Secondary | ICD-10-CM | POA: Diagnosis not present

## 2016-09-29 DIAGNOSIS — H43813 Vitreous degeneration, bilateral: Secondary | ICD-10-CM | POA: Diagnosis not present

## 2016-10-19 ENCOUNTER — Ambulatory Visit (INDEPENDENT_AMBULATORY_CARE_PROVIDER_SITE_OTHER): Payer: Self-pay | Admitting: Urgent Care

## 2016-10-19 VITALS — BP 136/72 | HR 60 | Temp 97.4°F | Ht 71.0 in | Wt 262.8 lb

## 2016-10-19 DIAGNOSIS — G4733 Obstructive sleep apnea (adult) (pediatric): Secondary | ICD-10-CM

## 2016-10-19 DIAGNOSIS — Z024 Encounter for examination for driving license: Secondary | ICD-10-CM

## 2016-10-19 DIAGNOSIS — I10 Essential (primary) hypertension: Secondary | ICD-10-CM

## 2016-10-19 NOTE — Progress Notes (Signed)
  Commercial Driver Medical Examination   [name] [name] is a 71 y.o. male who presents today for a DOT physical exam. The patient reports history of HTN, managed with Lasix, amlodipine, telmisartan.  Denies smoking cigarettes or drinking alcohol. Denies dizziness, chronic headache, blurred vision, chest pain, shortness of breath, heart racing, palpitations, nausea, vomiting, abdominal pain, hematuria, lower leg swelling. Wears glasses.   The following portions of the patient's history were reviewed and updated as appropriate: allergies, current medications, past family history, past medical history, past social history and past surgical history.  Objective:   BP 136/72 (BP Location: Right Arm, Patient Position: Sitting, Cuff Size: Large)   Pulse 60   Temp 97.4 F (36.3 C) (Oral)   Ht 5\' 11"  (1.803 m)   Wt 262 lb 12.8 oz (119.2 kg)   SpO2 97%   BMI 36.65 kg/m   Vision/hearing:  Visual Acuity Screening   Right eye Left eye Both eyes  Without correction:     With correction: 20/28 20/25 20/25   Comments: Horizontal vision done- passed  Hearing Screening Comments: Whisper test done at 40feet- passed  Patient can recognize and distinguish among traffic control signals and devices showing standard red, green, and amber colors.  Corrective lenses required: Yes  Monocular Vision?: No  Hearing aid requirement: No  Physical Exam  Constitutional: He is oriented to person, place, and time. He appears well-developed and well-nourished.  HENT:  TM's intact bilaterally, no effusions or erythema. Nasal turbinates pink and moist, nasal passages patent. No sinus tenderness. Oropharynx clear, mucous membranes moist, dentition in good repair.  Eyes: Conjunctivae and EOM are normal. Pupils are equal, round, and reactive to light. Right eye exhibits no discharge. Left eye exhibits no discharge. No scleral icterus.  Neck: Normal range of motion. Neck supple.  Cardiovascular: Normal rate, regular  rhythm and intact distal pulses.  Exam reveals no gallop and no friction rub.   No murmur heard. Pulmonary/Chest: No stridor. No respiratory distress. He has no wheezes. He has no rales.  Abdominal: Soft. Bowel sounds are normal. He exhibits no distension and no mass. There is no tenderness. There is no guarding.  Well-healed surgical scar over epigastric region.  Musculoskeletal: Normal range of motion. He exhibits no edema or tenderness.  Lymphadenopathy:    He has no cervical adenopathy.  Neurological: He is alert and oriented to person, place, and time. He has normal reflexes. He displays normal reflexes. Coordination normal.  Skin: Skin is warm and dry. Capillary refill takes less than 2 seconds. No rash noted. No erythema. No pallor.  Psychiatric: He has a normal mood and affect.   Labs: Comments: 1.005 Sp.Gr -  Neg protein- Trace- intact Blood- Neg Sugar  Assessment:    Healthy male exam.  Meets standards, but periodic monitoring required due to OSA, HTN.  Driver qualified only for 3 months.    Plan:   Medical examiners certificate completed and printed. Return as needed. Patient plans on returning to the clinic with his CPAP report to consider extending his DOT up to 1 year.  Jaynee Eagles, PA-C [facility] [phone] 10/19/2016  12:46 PM

## 2016-10-19 NOTE — Patient Instructions (Addendum)
Please make sure that you make an appointment with me to complete review of your sleep apnea report. We can extend your DOT card if everything checks out.    IF you received an x-ray today, you will receive an invoice from [facility]. Please contact [facility] at [phone] with questions or concerns regarding your invoice.   IF you received labwork today, you will receive an invoice from [facility]. Please contact [facility] at [phone] with questions or concerns regarding your invoice.   Our billing staff will not be able to assist you with questions regarding bills from these companies.  You will be contacted with the lab results as soon as they are available. The fastest way to get your results is to activate your My Chart account. Instructions are located on the last page of this paperwork. If you have not heard from [place] regarding the results in 2 weeks, please contact this office.

## 2016-11-13 DIAGNOSIS — D649 Anemia, unspecified: Secondary | ICD-10-CM | POA: Diagnosis not present

## 2016-11-26 ENCOUNTER — Encounter: Payer: Self-pay | Admitting: Urgent Care

## 2016-11-26 ENCOUNTER — Ambulatory Visit (INDEPENDENT_AMBULATORY_CARE_PROVIDER_SITE_OTHER): Payer: Medicare Other | Admitting: Urgent Care

## 2016-11-26 VITALS — BP 139/75 | HR 67 | Temp 98.0°F | Resp 18 | Ht 74.0 in | Wt 253.0 lb

## 2016-11-26 DIAGNOSIS — Z024 Encounter for examination for driving license: Secondary | ICD-10-CM

## 2016-11-26 DIAGNOSIS — G4733 Obstructive sleep apnea (adult) (pediatric): Secondary | ICD-10-CM

## 2016-11-26 DIAGNOSIS — I10 Essential (primary) hypertension: Secondary | ICD-10-CM

## 2016-11-26 NOTE — Patient Instructions (Signed)
     IF you received an x-ray today, you will receive an invoice from [facility]. Please contact [facility] at [phone] with questions or concerns regarding your invoice.   IF you received labwork today, you will receive an invoice from [facility]. Please contact [facility] at [phone] with questions or concerns regarding your invoice.   Our billing staff will not be able to assist you with questions regarding bills from these companies.  You will be contacted with the lab results as soon as they are available. The fastest way to get your results is to activate your My Chart account. Instructions are located on the last page of this paperwork. If you have not heard from us regarding the results in 2 weeks, please contact this office.     

## 2016-11-26 NOTE — Progress Notes (Signed)
   MRN: [id number] DOB: 12-01-1945  Subjective:   [name] [name] is a 71 y.o. male presenting for follow up on DOT. At his last visit, patient did not have his CPAP report. Today, he presents with this. Report shows usage rate of 97% between 10/25/2016-11/24/2016.   [name] [name] has a current medication list which includes the following prescription(s): alfuzosin, amlodipine, aspirin, clotrimazole-betamethasone, furosemide, naproxen sodium, pantoprazole, telmisartan, and tadalafil. Also has No Known Allergies. [name] [name]  has a past medical history of Difficulty urinating; GERD (gastroesophageal reflux disease); History of elevated glucose; Hypertension; and Sleep apnea. Also  has a past surgical history that includes Gastric restriction surgery (12/11/2008); Laparoscopic gastric banding (12/11/2008); Cystoscopy w/ ureteral stent placement (07/18/2011); Colonoscopy (07/17/11); Lithotripsy; and Hemorrhoid surgery (N/A, 08/04/2013).  Objective:   Vitals: BP 139/75 (BP Location: Right Arm, Patient Position: Sitting, Cuff Size: Large)   Pulse 67   Temp 98 F (36.7 C) (Oral)   Resp 18   Ht 6\' 2"  (1.88 m)   Wt 253 lb (114.8 kg)   SpO2 95%   BMI 32.48 kg/m   Physical Exam  Constitutional: He is oriented to person, place, and time. He appears well-developed and well-nourished.  Cardiovascular: Normal rate.   Pulmonary/Chest: Effort normal.  Neurological: He is alert and oriented to person, place, and time.   Assessment and Plan :   1. 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 A DOT physical was not repeated.  2. Essential hypertension - Controlled.  3. OSA (obstructive sleep apnea) - Compliant.  Jaynee Eagles, PA-C [facility] [phone] 11/26/2016 2:15 PM

## 2017-02-25 DIAGNOSIS — E889 Metabolic disorder, unspecified: Secondary | ICD-10-CM | POA: Diagnosis not present

## 2017-02-25 DIAGNOSIS — E559 Vitamin D deficiency, unspecified: Secondary | ICD-10-CM | POA: Diagnosis not present

## 2017-02-25 DIAGNOSIS — D649 Anemia, unspecified: Secondary | ICD-10-CM | POA: Diagnosis not present

## 2017-02-25 DIAGNOSIS — E038 Other specified hypothyroidism: Secondary | ICD-10-CM | POA: Diagnosis not present

## 2017-02-25 DIAGNOSIS — N400 Enlarged prostate without lower urinary tract symptoms: Secondary | ICD-10-CM | POA: Diagnosis not present

## 2017-02-25 DIAGNOSIS — R945 Abnormal results of liver function studies: Secondary | ICD-10-CM | POA: Diagnosis not present

## 2017-02-25 DIAGNOSIS — E519 Thiamine deficiency, unspecified: Secondary | ICD-10-CM | POA: Diagnosis not present

## 2017-02-25 DIAGNOSIS — E2740 Unspecified adrenocortical insufficiency: Secondary | ICD-10-CM | POA: Diagnosis not present

## 2017-05-24 DIAGNOSIS — L570 Actinic keratosis: Secondary | ICD-10-CM | POA: Diagnosis not present

## 2017-05-24 DIAGNOSIS — D1801 Hemangioma of skin and subcutaneous tissue: Secondary | ICD-10-CM | POA: Diagnosis not present

## 2017-05-24 DIAGNOSIS — L821 Other seborrheic keratosis: Secondary | ICD-10-CM | POA: Diagnosis not present

## 2017-08-05 DIAGNOSIS — N400 Enlarged prostate without lower urinary tract symptoms: Secondary | ICD-10-CM | POA: Diagnosis not present

## 2017-08-05 DIAGNOSIS — Z87442 Personal history of urinary calculi: Secondary | ICD-10-CM | POA: Diagnosis not present

## 2017-08-05 DIAGNOSIS — Z86010 Personal history of colonic polyps: Secondary | ICD-10-CM | POA: Diagnosis not present

## 2017-08-05 DIAGNOSIS — R9431 Abnormal electrocardiogram [ECG] [EKG]: Secondary | ICD-10-CM | POA: Diagnosis not present

## 2017-08-05 DIAGNOSIS — E559 Vitamin D deficiency, unspecified: Secondary | ICD-10-CM | POA: Diagnosis not present

## 2017-08-05 DIAGNOSIS — N529 Male erectile dysfunction, unspecified: Secondary | ICD-10-CM | POA: Diagnosis not present

## 2017-08-05 DIAGNOSIS — R972 Elevated prostate specific antigen [PSA]: Secondary | ICD-10-CM | POA: Diagnosis not present

## 2017-08-05 DIAGNOSIS — Z23 Encounter for immunization: Secondary | ICD-10-CM | POA: Diagnosis not present

## 2017-08-05 DIAGNOSIS — Z9989 Dependence on other enabling machines and devices: Secondary | ICD-10-CM | POA: Diagnosis not present

## 2017-08-05 DIAGNOSIS — Z8639 Personal history of other endocrine, nutritional and metabolic disease: Secondary | ICD-10-CM | POA: Diagnosis not present

## 2017-08-05 DIAGNOSIS — E669 Obesity, unspecified: Secondary | ICD-10-CM | POA: Diagnosis not present

## 2017-08-05 DIAGNOSIS — I10 Essential (primary) hypertension: Secondary | ICD-10-CM | POA: Diagnosis not present

## 2017-08-05 DIAGNOSIS — K219 Gastro-esophageal reflux disease without esophagitis: Secondary | ICD-10-CM | POA: Diagnosis not present

## 2017-08-16 DIAGNOSIS — M48062 Spinal stenosis, lumbar region with neurogenic claudication: Secondary | ICD-10-CM | POA: Diagnosis not present

## 2017-08-16 DIAGNOSIS — Z9989 Dependence on other enabling machines and devices: Secondary | ICD-10-CM | POA: Diagnosis not present

## 2017-08-16 DIAGNOSIS — G4733 Obstructive sleep apnea (adult) (pediatric): Secondary | ICD-10-CM | POA: Diagnosis not present

## 2017-08-16 DIAGNOSIS — R9431 Abnormal electrocardiogram [ECG] [EKG]: Secondary | ICD-10-CM | POA: Diagnosis not present

## 2017-08-26 DIAGNOSIS — Z4651 Encounter for fitting and adjustment of gastric lap band: Secondary | ICD-10-CM | POA: Diagnosis not present

## 2017-08-31 DIAGNOSIS — R9431 Abnormal electrocardiogram [ECG] [EKG]: Secondary | ICD-10-CM | POA: Diagnosis not present

## 2017-09-01 DIAGNOSIS — R350 Frequency of micturition: Secondary | ICD-10-CM | POA: Diagnosis not present

## 2017-09-01 DIAGNOSIS — N401 Enlarged prostate with lower urinary tract symptoms: Secondary | ICD-10-CM | POA: Diagnosis not present

## 2017-09-01 DIAGNOSIS — N5201 Erectile dysfunction due to arterial insufficiency: Secondary | ICD-10-CM | POA: Diagnosis not present

## 2017-09-01 DIAGNOSIS — R972 Elevated prostate specific antigen [PSA]: Secondary | ICD-10-CM | POA: Diagnosis not present

## 2017-09-22 DIAGNOSIS — R972 Elevated prostate specific antigen [PSA]: Secondary | ICD-10-CM | POA: Diagnosis not present

## 2017-09-30 ENCOUNTER — Encounter: Payer: Self-pay | Admitting: Physician Assistant

## 2017-09-30 ENCOUNTER — Ambulatory Visit: Payer: Self-pay | Admitting: Physician Assistant

## 2017-09-30 VITALS — BP 136/68 | HR 82 | Temp 97.7°F | Resp 16 | Ht 71.0 in | Wt 285.0 lb

## 2017-09-30 DIAGNOSIS — Z0289 Encounter for other administrative examinations: Secondary | ICD-10-CM

## 2017-09-30 NOTE — Patient Instructions (Signed)
     IF you received an x-ray today, you will receive an invoice from [facility]. Please contact [facility] at [phone] with questions or concerns regarding your invoice.   IF you received labwork today, you will receive an invoice from [facility]. Please contact [facility] at [phone] with questions or concerns regarding your invoice.   Our billing staff will not be able to assist you with questions regarding bills from these companies.  You will be contacted with the lab results as soon as they are available. The fastest way to get your results is to activate your My Chart account. Instructions are located on the last page of this paperwork. If you have not heard from us regarding the results in 2 weeks, please contact this office.     

## 2017-09-30 NOTE — Progress Notes (Signed)
[facility] AT [facility] [address], [place] [address] [phone]  Date:  09/30/2017   Name:  [name] [name]   DOB:  10-22-1945   MRN:  [id number]  PCP:  Anda Kraft, MD    History of Present Illness:  [name] [name] is a 72 y.o. male patient who presents to PCP with  Chief Complaint  Patient presents with  . Annual Exam    dot  He has no concerns or complaints at this time. He is using his CPAP machine every day.  He is here with his paperwork from [place].  This shows a use of 100%. Compliant with taking his blood pressure medications.  Patient Active Problem List   Diagnosis Date Noted  . Hemorrhoids 07/26/2013  . Left Ureteral stone 07/18/2011  . Pyelonephritis 07/18/2011  . Anal fissure 06/17/2011  . History of laparoscopic adjustable gastric banding 06/17/2011    Past Medical History:  Diagnosis Date  . Difficulty urinating    prostate problem  . GERD (gastroesophageal reflux disease)    HIATAL HERNIA REPAIRED -with lap band 2 years ago NO LONGER HAS GERD  . History of elevated glucose    IN THE PAST - NO PROBLEMS SINCE GASTRIC BANDING WEIGHT LOSS   . Hypertension    resolved with lap band 2 years ago  . Sleep apnea    uses cpap setting is 12    Past Surgical History:  Procedure Laterality Date  . COLONOSCOPY  07/17/11  . CYSTOSCOPY W/ URETERAL STENT PLACEMENT  07/18/2011   Procedure: CYSTOSCOPY WITH STENT REPLACEMENT;  Surgeon: Alexis Frock;  Location: [facility];  Service: Urology;  Laterality: Left;  Marland Kitchen GASTRIC RESTRICTION SURGERY  12/11/2008   lap band  . HEMORRHOID SURGERY N/A 08/04/2013   Procedure: EUA,HEMORRHOIDECTOMY;  Surgeon: [name], MD;  Location: [facility];  Service: General;  Laterality: N/A;  . LAPAROSCOPIC GASTRIC BANDING  12/11/2008  . LITHOTRIPSY      Social History   Tobacco Use  . Smoking status: Former Smoker    Packs/day: 1.50    Years: 9.00    Pack years: 13.50    Types: Cigarettes    Last attempt to quit: 04/05/1971   Years since quitting: 46.5  . Smokeless tobacco: Former Network engineer Use Topics  . Alcohol use: No    Alcohol/week: 0.0 oz    Comment: occas wine  . Drug use: No    Family History  Problem Relation Age of Onset  . Cancer Mother        ovarian  . Diabetes Mother   . Dementia Mother   . Other Father        MVA    No Known Allergies  Medication list has been reviewed and updated.  Current Outpatient Medications on File Prior to Visit  Medication Sig Dispense Refill  . alfuzosin (UROXATRAL) 10 MG 24 hr tablet Take 10 mg by mouth daily.     Marland Kitchen amLODipine (NORVASC) 10 MG tablet Take 10 mg by mouth every morning.     Marland Kitchen aspirin 325 MG EC tablet Take 325 mg by mouth daily.    . clotrimazole-betamethasone (LOTRISONE) cream Reported on 10/22/2015  99  . furosemide (LASIX) 40 MG tablet Take 40 mg by mouth every morning.     . naproxen sodium (ANAPROX) 550 MG tablet Take 550 mg by mouth 3 (three) times daily as needed.  0  . pantoprazole (PROTONIX) 40 MG tablet Take 40 mg by mouth  daily.    . tadalafil (CIALIS) 5 MG tablet Take 5 mg by mouth daily as needed for erectile dysfunction.    Marland Kitchen telmisartan (MICARDIS) 80 MG tablet Take 80 mg by mouth every morning.     No current facility-administered medications on file prior to visit.     Review of Systems  Constitutional: Negative for chills and fever.  HENT: Negative for ear discharge, ear pain and sore throat.   Eyes: Negative for blurred vision and double vision.  Respiratory: Negative for cough, shortness of breath and wheezing.   Cardiovascular: Negative for chest pain, palpitations and leg swelling.  Gastrointestinal: Negative for diarrhea, nausea and vomiting.  Genitourinary: Negative for dysuria, frequency and hematuria.  Skin: Negative for itching and rash.  Neurological: Negative for dizziness and headaches.   ROS otherwise unremarkable unless listed above.  Physical Examination: BP 136/68   Pulse 82   Temp 97.7 F  (36.5 C) (Oral)   Resp 16   Ht 5\' 11"  (1.803 m)   Wt 285 lb (129.3 kg)   SpO2 95%   BMI 39.75 kg/m  Ideal Body Weight: Weight in (lb) to have BMI = 25: 178.9  Physical Exam  Constitutional: He is oriented to person, place, and time. He appears well-developed and well-nourished. No distress.  HENT:  Head: Normocephalic and atraumatic.  Right Ear: Tympanic membrane, external ear and ear canal normal.  Left Ear: Tympanic membrane, external ear and ear canal normal.  Eyes: Pupils are equal, round, and reactive to light. Conjunctivae and EOM are normal.  Cardiovascular: Normal rate and regular rhythm. Exam reveals no friction rub.  No murmur heard. Pulmonary/Chest: Effort normal. No respiratory distress. He has no wheezes.  Abdominal: Soft. Bowel sounds are normal. He exhibits no distension and no mass. There is no tenderness. Hernia confirmed negative in the right inguinal area and confirmed negative in the left inguinal area.  Musculoskeletal: Normal range of motion. He exhibits no edema or tenderness.  Neurological: He is alert and oriented to person, place, and time. He displays normal reflexes.  Skin: Skin is warm and dry. He is not diaphoretic.  Psychiatric: He has a normal mood and affect. His behavior is normal.    Visual Acuity Screening   Right eye Left eye Both eyes  Without correction:     With correction: 20/25 20/15 20/15   Comments: Colors:6/6  titmus: 85/pass  Hearing Screening Comments: Whisper: 44ft  Assessment and Plan: [name] [name] is a 72 y.o. male who is here today for cc of  Chief Complaint  Patient presents with  . Annual Exam    dot  1 year given for certification due to hypertension and CPAP. Encounter for examination required by Department of Transportation (DOT)  [name] Drape, PA-C [facility] 3/28/201910:42 AM

## 2017-10-06 DIAGNOSIS — Z23 Encounter for immunization: Secondary | ICD-10-CM | POA: Diagnosis not present

## 2017-10-07 DIAGNOSIS — Z6841 Body Mass Index (BMI) 40.0 and over, adult: Secondary | ICD-10-CM | POA: Diagnosis not present

## 2017-10-07 DIAGNOSIS — Z9884 Bariatric surgery status: Secondary | ICD-10-CM | POA: Diagnosis not present

## 2017-10-13 ENCOUNTER — Encounter: Payer: Self-pay | Admitting: Physician Assistant

## 2017-11-04 DIAGNOSIS — L820 Inflamed seborrheic keratosis: Secondary | ICD-10-CM | POA: Diagnosis not present

## 2017-11-04 DIAGNOSIS — D485 Neoplasm of uncertain behavior of skin: Secondary | ICD-10-CM | POA: Diagnosis not present

## 2017-11-04 DIAGNOSIS — L819 Disorder of pigmentation, unspecified: Secondary | ICD-10-CM | POA: Diagnosis not present

## 2017-12-07 DIAGNOSIS — H52223 Regular astigmatism, bilateral: Secondary | ICD-10-CM | POA: Diagnosis not present

## 2017-12-07 DIAGNOSIS — I10 Essential (primary) hypertension: Secondary | ICD-10-CM | POA: Diagnosis not present

## 2017-12-07 DIAGNOSIS — H5203 Hypermetropia, bilateral: Secondary | ICD-10-CM | POA: Diagnosis not present

## 2017-12-07 DIAGNOSIS — H524 Presbyopia: Secondary | ICD-10-CM | POA: Diagnosis not present

## 2017-12-07 DIAGNOSIS — H43813 Vitreous degeneration, bilateral: Secondary | ICD-10-CM | POA: Diagnosis not present

## 2017-12-07 DIAGNOSIS — H2513 Age-related nuclear cataract, bilateral: Secondary | ICD-10-CM | POA: Diagnosis not present

## 2017-12-28 DIAGNOSIS — Z4651 Encounter for fitting and adjustment of gastric lap band: Secondary | ICD-10-CM | POA: Diagnosis not present

## 2018-01-25 DIAGNOSIS — E559 Vitamin D deficiency, unspecified: Secondary | ICD-10-CM | POA: Diagnosis not present

## 2018-01-25 DIAGNOSIS — E118 Type 2 diabetes mellitus with unspecified complications: Secondary | ICD-10-CM | POA: Diagnosis not present

## 2018-01-25 DIAGNOSIS — Z125 Encounter for screening for malignant neoplasm of prostate: Secondary | ICD-10-CM | POA: Diagnosis not present

## 2018-01-25 DIAGNOSIS — I10 Essential (primary) hypertension: Secondary | ICD-10-CM | POA: Diagnosis not present

## 2018-02-01 DIAGNOSIS — E559 Vitamin D deficiency, unspecified: Secondary | ICD-10-CM | POA: Diagnosis not present

## 2018-02-01 DIAGNOSIS — I10 Essential (primary) hypertension: Secondary | ICD-10-CM | POA: Diagnosis not present

## 2018-02-01 DIAGNOSIS — Z0000 Encounter for general adult medical examination without abnormal findings: Secondary | ICD-10-CM | POA: Diagnosis not present

## 2018-02-01 DIAGNOSIS — K219 Gastro-esophageal reflux disease without esophagitis: Secondary | ICD-10-CM | POA: Diagnosis not present

## 2018-05-17 DIAGNOSIS — G629 Polyneuropathy, unspecified: Secondary | ICD-10-CM | POA: Diagnosis not present

## 2018-05-17 DIAGNOSIS — Z23 Encounter for immunization: Secondary | ICD-10-CM | POA: Diagnosis not present

## 2018-05-17 DIAGNOSIS — M6289 Other specified disorders of muscle: Secondary | ICD-10-CM | POA: Diagnosis not present

## 2018-07-19 ENCOUNTER — Encounter: Payer: Self-pay | Admitting: *Deleted

## 2018-07-20 ENCOUNTER — Encounter: Payer: Self-pay | Admitting: Diagnostic Neuroimaging

## 2018-07-20 ENCOUNTER — Ambulatory Visit (INDEPENDENT_AMBULATORY_CARE_PROVIDER_SITE_OTHER): Payer: Medicare Other | Admitting: Diagnostic Neuroimaging

## 2018-07-20 VITALS — BP 154/86 | HR 60 | Ht 71.0 in | Wt 297.4 lb

## 2018-07-20 DIAGNOSIS — M48062 Spinal stenosis, lumbar region with neurogenic claudication: Secondary | ICD-10-CM

## 2018-07-20 NOTE — Progress Notes (Signed)
[facility]  PATIENT: [name] [name] DOB: January 04, 1946  REFERRING CLINICIAN: Kohut HISTORY FROM: patient and wife  REASON FOR VISIT: new consult / existing pt   HISTORICAL  CHIEF COMPLAINT:  Chief Complaint  Patient presents with  . New Consult    Referred by Dr. Shelia Media Thedora Hinders NP  . R leg numbness/ Peripheral Neurology    Rm 7, wife.  R leg, foot numnbess, intermittent, takes gabapentin which helps some. Seen here 2016    HISTORY OF PRESENT ILLNESS:   NEW HPI (07/20/18, VRP): 73 year old male with right foot pain / numbness. Since last visit, doing about the same. Symptoms are about the same. Severity is mild-moderate. No alleviating or aggravating factors. Tolerating gabapentin now 100mg  twice a day --> started by PCP.  UPDATE 05/21/15: Since last visit, symptoms are stable. Test results reviewed. Still with intermittent numbness and twitching in right foot (sometimes left) esp with prolonged standing or walking.   PRIOR HPI (04/05/15): 73 year old right-handed male here for valuation of right foot numbness and twitching. March 2016 patient had onset of intermittent numbness and involuntary movement of the right foot lasting for 5-10 seconds at a time. He has had 6-10 attacks since March 2016. Symptoms typically affect him when he has been standing upright for a certain period of time. Movement bending walking do not seem to aggravate the symptoms. Symptoms do not affect him when he is sitting down. He recalls one particularly severe attack in June 2016 when he was shopping with his wife, had significant numbness in the right foot, was concerned about strokelike symptoms and went to the parking lot to sit his car. He also had significant anxiety with this attack and patient is not sure of the contribution of anxiety versus the neurologic symptoms. Symptoms ultimately resolved. He called his PCP to discuss the symptoms. Patient did not go to the emergency room.  Patient was referred to me for EMG study on 03/07/15. Study was slightly abnormal demonstrating evidence of right L5-S1 radiculopathies.    REVIEW OF SYSTEMS: Full 14 system review of systems performed and negative except: SOB ringing in ears pain.    ALLERGIES: No Known Allergies  HOME MEDICATIONS: Outpatient Medications Prior to Visit  Medication Sig Dispense Refill  . alfuzosin (UROXATRAL) 10 MG 24 hr tablet Take 10 mg by mouth daily.     Marland Kitchen amLODipine (NORVASC) 10 MG tablet Take 10 mg by mouth every morning.     Marland Kitchen aspirin 325 MG EC tablet Take 325 mg by mouth daily.    . furosemide (LASIX) 40 MG tablet Take 40 mg by mouth every morning.     . Multiple Vitamin (MULTIVITAMIN) tablet Take 1 tablet by mouth daily.    Marland Kitchen OVER THE COUNTER MEDICATION Stool softener daily    . pantoprazole (PROTONIX) 20 MG tablet Take 20 mg by mouth daily.    . tadalafil (CIALIS) 5 MG tablet Take 5 mg by mouth daily as needed for erectile dysfunction.    Marland Kitchen telmisartan (MICARDIS) 80 MG tablet Take 80 mg by mouth every morning.    . clotrimazole-betamethasone (LOTRISONE) cream Reported on 10/22/2015  99  . naproxen sodium (ANAPROX) 550 MG tablet Take 550 mg by mouth 3 (three) times daily as needed.  0  . pantoprazole (PROTONIX) 40 MG tablet Take 40 mg by mouth daily.     No facility-administered medications prior to visit.     PAST MEDICAL HISTORY: Past Medical History:  Diagnosis Date  .  Difficulty urinating    prostate problem  . GERD (gastroesophageal reflux disease)    HIATAL HERNIA REPAIRED -with lap band 2 years ago NO LONGER HAS GERD  . History of elevated glucose    IN THE PAST - NO PROBLEMS SINCE GASTRIC BANDING WEIGHT LOSS   . Hypertension    resolved with lap band 2 years ago  . Sleep apnea    uses cpap setting is 12    PAST SURGICAL HISTORY: Past Surgical History:  Procedure Laterality Date  . COLONOSCOPY  07/17/11  . CYSTOSCOPY W/ URETERAL STENT PLACEMENT  07/18/2011   Procedure:  CYSTOSCOPY WITH STENT REPLACEMENT;  Surgeon: Alexis Frock;  Location: [facility];  Service: Urology;  Laterality: Left;  Marland Kitchen GASTRIC RESTRICTION SURGERY  12/11/2008   lap band  . HEMORRHOID SURGERY N/A 08/04/2013   Procedure: EUA,HEMORRHOIDECTOMY;  Surgeon: [name], MD;  Location: [facility];  Service: General;  Laterality: N/A;  . LAPAROSCOPIC GASTRIC BANDING  12/11/2008  . LITHOTRIPSY      FAMILY HISTORY: Family History  Problem Relation Age of Onset  . Cancer Mother        ovarian  . Diabetes Mother   . Dementia Mother   . Other Father        MVA    SOCIAL HISTORY:  Social History   Socioeconomic History  . Marital status: Married    Spouse name: [name]  . Number of children: 2  . Years of education: 21  . Highest education level: Not on file  Occupational History    Comment: retired, natural Careers adviser  Social Needs  . Financial resource strain: Not on file  . Food insecurity:    Worry: Not on file    Inability: Not on file  . Transportation needs:    Medical: Not on file    Non-medical: Not on file  Tobacco Use  . Smoking status: Former Smoker    Packs/day: 1.50    Years: 9.00    Pack years: 13.50    Types: Cigarettes    Last attempt to quit: 04/05/1971    Years since quitting: 47.3  . Smokeless tobacco: Former Network engineer and Sexual Activity  . Alcohol use: No    Alcohol/week: 0.0 standard drinks    Comment: occas wine  . Drug use: No  . Sexual activity: Not on file  Lifestyle  . Physical activity:    Days per week: Not on file    Minutes per session: Not on file  . Stress: Not on file  Relationships  . Social connections:    Talks on phone: Not on file    Gets together: Not on file    Attends religious service: Not on file    Active member of club or organization: Not on file    Attends meetings of clubs or organizations: Not on file    Relationship status: Not on file  . Intimate partner violence:    Fear of current or ex partner: Not on  file    Emotionally abused: Not on file    Physically abused: Not on file    Forced sexual activity: Not on file  Other Topics Concern  . Not on file  Social History Narrative   Married, lives at home with wife, [name].  Children 2.  Education 12th grade.  Reitired.     Caffeine use- coffee 1 cups daily     PHYSICAL EXAM  GENERAL EXAM/CONSTITUTIONAL: Vitals:  Vitals:  07/20/18 0802  BP: (!) 154/86  Pulse: 60  Weight: 297 lb 6.4 oz (134.9 kg)  Height: 5\' 11"  (1.803 m)   Body mass index is 41.48 kg/m.  Wt Readings from Last 3 Encounters:  07/20/18 297 lb 6.4 oz (134.9 kg)  09/30/17 285 lb (129.3 kg)  11/26/16 253 lb (114.8 kg)     Visual Acuity Screening   Right eye Left eye Both eyes  Without correction:     With correction: 20/100 20/70     Patient is in no distress; well developed, nourished and groomed; neck is supple   CARDIOVASCULAR:  Examination of carotid arteries is normal; no carotid bruits  Regular rate and rhythm, no murmurs  Examination of peripheral vascular system by observation and palpation is normal  EYES:  Ophthalmoscopic exam of optic discs and posterior segments is normal; no papilledema or hemorrhages  MUSCULOSKELETAL:  Gait, strength, tone, movements noted in Neurologic exam below  NEUROLOGIC: MENTAL STATUS:  No flowsheet data found.  awake, alert, oriented to person, place and time  recent and remote memory intact  normal attention and concentration  language fluent, comprehension intact, naming intact,   fund of knowledge appropriate  CRANIAL NERVE:   2nd - no papilledema on fundoscopic exam  2nd, 3rd, 4th, 6th - pupils equal and reactive to light, visual fields full to confrontation, extraocular muscles intact, no nystagmus  5th - facial sensation symmetric  7th - facial strength symmetric  8th - hearing intact  9th - palate elevates symmetrically, uvula midline  11th - shoulder shrug symmetric  12th -  tongue protrusion midline  MOTOR:   normal bulk and tone, full strength in the BUE, BLE  SENSORY:   normal and symmetric to light touch, temperature, vibration  COORDINATION:   finger-nose-finger, fine finger movements normal  REFLEXES:   deep tendon reflexes TRACE and symmetric  GAIT/STATION:   narrow based gait; romberg is negative; SLIGHT DIFF WITH RIGHT HEEL GAIT    DIAGNOSTIC DATA (LABS, IMAGING, TESTING) - I reviewed patient records, labs, notes, testing and imaging myself where available.  Lab Results  Component Value Date   WBC 6.2 07/31/2013   HGB 13.4 07/31/2013   HCT 39.6 07/31/2013   MCV 93.8 07/31/2013   PLT 174 07/31/2013      Component Value Date/Time   NA 143 07/31/2013 0845   K 4.3 07/31/2013 0845   CL 103 07/31/2013 0845   CO2 28 07/31/2013 0845   GLUCOSE 117 (H) 07/31/2013 0845   BUN 13 07/31/2013 0845   CREATININE 0.73 07/31/2013 0845   CALCIUM 8.6 07/31/2013 0845   PROT 7.5 12/06/2008 1006   ALBUMIN 4.2 12/06/2008 1006   AST 23 12/06/2008 1006   ALT 26 12/06/2008 1006   ALKPHOS 40 12/06/2008 1006   BILITOT 0.6 12/06/2008 1006   GFRNONAA >90 07/31/2013 0845   GFRAA >90 07/31/2013 0845   No results found for: CHOL, HDL, LDLCALC, LDLDIRECT, TRIG, CHOLHDL No results found for: HGBA1C No results found for: VITAMINB12 No results found for: TSH   03/08/15 EMG/NCS 1. Electrodiagnostic evidence of right L5, S1 radiculopathies. 2. Bilateral lower lumbar root irritation noted on paraspinal needle EMG.  05/10/15 MRI lumbar spine [I reviewed images myself and agree with interpretation. At L4-5 the leftward disc herniation is likely asymptomatic. However I think he is symptomatic from spinal stenosis at L3-4. -VRP]  1. At L4-L5, there is a large left disc herniation that feels the lateral recess and severe loss of disc  height, endplate spurring, moderate facet hypertrophy. There is compression of the left L5 nerve root there is distorsion of the  left S1 nerve root. Additionally, there is some lesser encroachment upon the exiting L4 and the right L5 nerve root. 2. At L3-L4, there is moderate spinal stenosis, transverse more than AP, and severe facet hypertrophy causing mild anterolisthesis. There is no definite nerve root compression though there is some encroachment upon the left L3 and left L4 nerve roots 3.Moderate degenerative changes at the other lumbar levels do not lead to encroachment or compression of the nerve roots. 4.The L5 vertebral body appears to be sacralized. This can be confirmed on plain x-rays.  05/01/15 carotid u/s  - no stenosis  04/29/15 MRI brain (without) demonstrating: 1. Scattered periventricular and subcortical chronic small vessel ischemic disease. 2. No acute findings.  04/29/15 EEG - normal    ASSESSMENT AND PLAN  73 y.o. year old male here with intermittent right foot numbness and tremor, particularly with standing position, most likely related to lumbar radiculopathy and spinal stenosis.    Dx:  Spinal stenosis of lumbar region with neurogenic claudication    PLAN:  RIGHT LEG PAIN / NUMBNESS --> DUE TO LUMBAR SPINAL STENOSIS - continue gabapentin 100mg  twice a day; may gradually increase up to 300mg  twice a day over time - consider neurosurgery/spine or interventional pain evaluation (patient would like to hold off for now) - optimize weight, nutrition, exercise  Return if symptoms worsen or fail to improve, for return to PCP.    Penni Bombard, MD 5/37/4827, 0:78 AM Certified in Neurology, Neurophysiology and Neuroimaging  [facility] [address] [place], [place] [address] [phone]

## 2018-07-20 NOTE — Patient Instructions (Signed)
RIGHT LEG PAIN / NUMBNESS --> DUE TO LUMBAR SPINAL STENOSIS - continue gabapentin 100mg  twice a day; may gradually increase up to 300mg  twice a day over time  - consider neurosurgery/spine or interventional pain evaluation  - optimize weight, nutrition, exercise

## 2018-07-25 DIAGNOSIS — I7121 Aneurysm of the ascending aorta, without rupture: Secondary | ICD-10-CM

## 2018-07-25 DIAGNOSIS — I10 Essential (primary) hypertension: Secondary | ICD-10-CM

## 2018-07-25 DIAGNOSIS — G4733 Obstructive sleep apnea (adult) (pediatric): Secondary | ICD-10-CM

## 2018-07-25 DIAGNOSIS — E119 Type 2 diabetes mellitus without complications: Secondary | ICD-10-CM | POA: Insufficient documentation

## 2018-07-25 DIAGNOSIS — I712 Thoracic aortic aneurysm, without rupture: Secondary | ICD-10-CM | POA: Insufficient documentation

## 2018-07-25 DIAGNOSIS — Z9989 Dependence on other enabling machines and devices: Secondary | ICD-10-CM

## 2018-07-25 DIAGNOSIS — M48062 Spinal stenosis, lumbar region with neurogenic claudication: Secondary | ICD-10-CM

## 2018-07-25 DIAGNOSIS — I77810 Thoracic aortic ectasia: Secondary | ICD-10-CM

## 2018-07-30 ENCOUNTER — Other Ambulatory Visit: Payer: Self-pay | Admitting: Cardiology

## 2018-07-30 DIAGNOSIS — I77810 Thoracic aortic ectasia: Secondary | ICD-10-CM

## 2018-08-03 DIAGNOSIS — K219 Gastro-esophageal reflux disease without esophagitis: Secondary | ICD-10-CM | POA: Diagnosis not present

## 2018-08-03 DIAGNOSIS — E789 Disorder of lipoprotein metabolism, unspecified: Secondary | ICD-10-CM | POA: Diagnosis not present

## 2018-08-03 DIAGNOSIS — E559 Vitamin D deficiency, unspecified: Secondary | ICD-10-CM | POA: Diagnosis not present

## 2018-08-03 DIAGNOSIS — I10 Essential (primary) hypertension: Secondary | ICD-10-CM | POA: Diagnosis not present

## 2018-08-03 DIAGNOSIS — Z6841 Body Mass Index (BMI) 40.0 and over, adult: Secondary | ICD-10-CM | POA: Diagnosis not present

## 2018-08-03 DIAGNOSIS — E118 Type 2 diabetes mellitus with unspecified complications: Secondary | ICD-10-CM | POA: Diagnosis not present

## 2018-08-16 DIAGNOSIS — Z4651 Encounter for fitting and adjustment of gastric lap band: Secondary | ICD-10-CM | POA: Diagnosis not present

## 2018-08-18 DIAGNOSIS — H5203 Hypermetropia, bilateral: Secondary | ICD-10-CM | POA: Diagnosis not present

## 2018-08-18 DIAGNOSIS — H524 Presbyopia: Secondary | ICD-10-CM | POA: Diagnosis not present

## 2018-08-18 DIAGNOSIS — H2513 Age-related nuclear cataract, bilateral: Secondary | ICD-10-CM | POA: Diagnosis not present

## 2018-08-18 DIAGNOSIS — H52223 Regular astigmatism, bilateral: Secondary | ICD-10-CM | POA: Diagnosis not present

## 2018-08-29 DIAGNOSIS — R972 Elevated prostate specific antigen [PSA]: Secondary | ICD-10-CM | POA: Diagnosis not present

## 2018-08-31 ENCOUNTER — Ambulatory Visit: Payer: Medicare Other

## 2018-08-31 DIAGNOSIS — I77810 Thoracic aortic ectasia: Secondary | ICD-10-CM

## 2018-08-31 DIAGNOSIS — I272 Pulmonary hypertension, unspecified: Secondary | ICD-10-CM | POA: Diagnosis not present

## 2018-09-07 ENCOUNTER — Ambulatory Visit: Payer: Self-pay | Admitting: Cardiology

## 2018-09-07 DIAGNOSIS — R972 Elevated prostate specific antigen [PSA]: Secondary | ICD-10-CM | POA: Diagnosis not present

## 2018-09-07 DIAGNOSIS — N5201 Erectile dysfunction due to arterial insufficiency: Secondary | ICD-10-CM | POA: Diagnosis not present

## 2018-09-07 DIAGNOSIS — N401 Enlarged prostate with lower urinary tract symptoms: Secondary | ICD-10-CM | POA: Diagnosis not present

## 2018-09-07 DIAGNOSIS — R350 Frequency of micturition: Secondary | ICD-10-CM | POA: Diagnosis not present

## 2018-09-14 DIAGNOSIS — G4733 Obstructive sleep apnea (adult) (pediatric): Secondary | ICD-10-CM | POA: Diagnosis not present

## 2018-09-14 DIAGNOSIS — Z9989 Dependence on other enabling machines and devices: Secondary | ICD-10-CM | POA: Diagnosis not present

## 2018-09-16 NOTE — Progress Notes (Signed)
Subjective:   [name] [name], male    DOB: [birth year], 73 y.o.   MRN: [id number]  Deland Pretty, MD:  Chief Complaint  Patient presents with  . Hypertension  . Follow-up    HPI: [name] [name]  is a 73 y.o. male  With hypertension, type 2 diabetes, lumbar spinal stenosis, obstructive sleep apnea on CPAP, aortic root dilation, and s/p gastric banding surgery in 2010.   Patient was seen 1 year ago for abnormal EKG. He was noted to have aortic root dilation of 4.3 cm on echocardiogram.  He recently underwent echocardiogram in Feb 2020 that showed slight progression of aortic root dilation to 4.5 cm, and also moderate pulmonary hypertension with PA pressure of 50 mmHg that was new compared to previous echocardiogram.  He is here to discuss results.  Patient is without complaints today.  He denies any chest pain or shortness of breath.  Has claudication symptoms felt to be neurological from spinal stenosis.  Has had negative vascular work-up in the past. Has leg edema that he states improved with support stockings. Reports labs with PCP have been normal.  He is compliant with CPAP daily.  Was able to come off diabetes medication after lap band procedure.  Patient reports he lost 90 pounds after his weight loss procedure; however, has gained 40 to 50 pounds over the last 1 to 2 years.  He does not exercise regularly, but is fairly active with routine activities.  Former tobacco use in 1970s.    Past Medical History:  Diagnosis Date  . Difficulty urinating    prostate problem  . GERD (gastroesophageal reflux disease)    HIATAL HERNIA REPAIRED -with lap band 2 years ago NO LONGER HAS GERD  . History of elevated glucose    IN THE PAST - NO PROBLEMS SINCE GASTRIC BANDING WEIGHT LOSS   . Hypertension    resolved with lap band 2 years ago  . Sleep apnea    uses cpap setting is 12    Past Surgical History:  Procedure Laterality Date  . COLONOSCOPY  07/17/11  . CYSTOSCOPY W/ URETERAL  STENT PLACEMENT  07/18/2011   Procedure: CYSTOSCOPY WITH STENT REPLACEMENT;  Surgeon: Alexis Frock;  Location: [facility];  Service: Urology;  Laterality: Left;  Marland Kitchen GASTRIC RESTRICTION SURGERY  12/11/2008   lap band  . HEMORRHOID SURGERY N/A 08/04/2013   Procedure: EUA,HEMORRHOIDECTOMY;  Surgeon: [name], MD;  Location: [facility];  Service: General;  Laterality: N/A;  . LAPAROSCOPIC GASTRIC BANDING  12/11/2008  . LITHOTRIPSY      Family History  Problem Relation Age of Onset  . Cancer Mother        ovarian  . Diabetes Mother   . Dementia Mother   . Other Father        MVA    Social History   Socioeconomic History  . Marital status: Married    Spouse name: [name] [name]  . Number of children: 2  . Years of education: 53  . Highest education level: Not on file  Occupational History    Comment: retired, natural Careers adviser  Social Needs  . Financial resource strain: Not on file  . Food insecurity:    Worry: Not on file    Inability: Not on file  . Transportation needs:    Medical: Not on file    Non-medical: Not on file  Tobacco Use  . Smoking status: Former Smoker    Packs/day: 2.00  Years: 8.00    Pack years: 16.00    Types: Cigarettes    Last attempt to quit: 04/05/1971    Years since quitting: 47.4  . Smokeless tobacco: Former Network engineer and Sexual Activity  . Alcohol use: Yes    Alcohol/week: 0.0 standard drinks    Comment: occas wine  . Drug use: No  . Sexual activity: Not on file  Lifestyle  . Physical activity:    Days per week: Not on file    Minutes per session: Not on file  . Stress: Not on file  Relationships  . Social connections:    Talks on phone: Not on file    Gets together: Not on file    Attends religious service: Not on file    Active member of club or organization: Not on file    Attends meetings of clubs or organizations: Not on file    Relationship status: Not on file  . Intimate partner violence:    Fear of current or ex partner:  Not on file    Emotionally abused: Not on file    Physically abused: Not on file    Forced sexual activity: Not on file  Other Topics Concern  . Not on file  Social History Narrative   Married, lives at home with wife, [name] [name].  Children 2.  Education 12th grade.  Reitired.     Caffeine use- coffee 1 cups daily    Current Meds  Medication Sig  . alfuzosin (UROXATRAL) 10 MG 24 hr tablet Take 10 mg by mouth daily.   Marland Kitchen amLODipine (NORVASC) 10 MG tablet Take 10 mg by mouth every morning.   Marland Kitchen aspirin 325 MG EC tablet Take 325 mg by mouth daily.  . Cholecalciferol (VITAMIN D3) 125 MCG (5000 UT) TABS Take 1 tablet by mouth daily.  . furosemide (LASIX) 40 MG tablet Take 40 mg by mouth every morning.   . Multiple Vitamin (MULTIVITAMIN) tablet Take 1 tablet by mouth daily.  Marland Kitchen OVER THE COUNTER MEDICATION Stool softener daily  . pantoprazole (PROTONIX) 20 MG tablet Take 40 mg by mouth daily.   Marland Kitchen telmisartan (MICARDIS) 80 MG tablet Take 80 mg by mouth every morning.  . thiamine (VITAMIN B-1) 100 MG tablet Take 100 mg by mouth daily as needed.   . Zinc 50 MG TABS Take 1 tablet by mouth daily.     Review of Systems  Constitution: Negative for decreased appetite, malaise/fatigue, weight gain and weight loss.  Eyes: Negative for visual disturbance.  Cardiovascular: Positive for claudication (spinal stenosis; stable) and leg swelling (end of the day; improved with support stockings). Negative for chest pain, dyspnea on exertion, orthopnea, palpitations and syncope.  Respiratory: Negative for hemoptysis and wheezing.   Endocrine: Negative for cold intolerance and heat intolerance.  Hematologic/Lymphatic: Does not bruise/bleed easily.  Skin: Negative for nail changes.  Musculoskeletal: Negative for muscle weakness and myalgias.  Gastrointestinal: Negative for abdominal pain, change in bowel habit, nausea and vomiting.  Neurological: Negative for difficulty with concentration, dizziness, focal  weakness and headaches.  Psychiatric/Behavioral: Negative for altered mental status and suicidal ideas.  All other systems reviewed and are negative.      Objective:     Blood pressure 134/75, pulse (!) 57, height 6' (1.829 m), weight 299 lb (135.6 kg), SpO2 94 %.  Cardiac studies:  EKG 08/16/2017: Sinus bradycardia at the rate of 53 bpm, normal axis, early R-wave regression V2, probably normal variant. Cannot exclude RVH. No evidence  of ischemia, normal QT interval.  Echocardiogram 08/31/2018:  Left ventricle cavity is normal in size. Moderate concentric hypertrophy of the left ventricle. Normal global wall motion. Calculated EF 55%. Left atrial cavity is severely dilated. Moderate (Grade II) mitral regurgitation. Moderate tricuspid regurgitation. Moderate pulmonary hypertension. Estimated pulmonary artery systolic pressure 50 mmHg. The aortic root is dilated measuring 4.5 cm. Compared to previous study on 08/31/2017, there is mild increase in aortic root diameter and severity of pulmonary hypertension.  US Carotid Bilateral Cone 05/01/2015: Normal. Negative for stenosis.   [place] Lower Ext Art Bilat 11/23/2014: 1. Elevated the bilateral ankle-brachial indices consistent with medial arterial sclerosis. This is often seen in the setting of chronic diabetes or end-stage renal disease. 2. Dedicated duplex sonography of the bilateral lower extremities demonstrates no evidence of clinically significant peripheral arterial disease. There is very mild scattered atherosclerotic plaque.  Sleep Study: Positive for Sleep Apnea; Uses CPAP    Physical Exam  Constitutional: He appears well-developed and well-nourished. No distress.  Morbidly obese  HENT:  Head: Atraumatic.  Eyes: Conjunctivae are normal.  Neck: Neck supple. No JVD present. No thyromegaly present.  Short neck and difficult to evaluate JVP  Cardiovascular: Normal rate, regular rhythm, normal heart sounds and intact distal pulses.  Exam reveals no gallop.  No murmur heard. Pulses:      Carotid pulses are 2+ on the right side and 2+ on the left side.      Dorsalis pedis pulses are 2+ on the right side and 2+ on the left side.       Posterior tibial pulses are 2+ on the right side and 2+ on the left side.  Femoral and popliteal pulse difficult to feel due to patient's body habitus.   Pulmonary/Chest: Effort normal and breath sounds normal.  Abdominal: Soft. Bowel sounds are normal.  Obese. Pannus present  Musculoskeletal: Normal range of motion.        General: No edema.  Neurological: He is alert.  Skin: Skin is warm and dry.  Psychiatric: He has a normal mood and affect.     Recent Labs:  08/05/2017: CBC normal.  Creatinine 0.9, EGFR 87/106, potassium 4.3, CMP normal.  Hemoglobin A1c 6%.  Cholesterol 147, triglycerides 56, HDL 38, LDL 98.      Assessment & Recommendations:  1. Aortic root dilation (HCC) Slight progression of aortic root dilation compared to echocardiogram 1 year ago.  We will further evaluate with CTA of the chest.  He will continue need surveillance for this.  He is currently on ARB therapy and blood pressure is well controlled.  2. Morbid obesity with BMI of 40.0-44.9, adult [facility]) Unfortunately he has continued to gain weight since his last office visit.  Feel that his echocardiogram findings are likely secondary to this.  I have discussed the importance of weight loss.  Encourage portion control and regular exercise.  3. HTN (hypertension), benign Blood pressure is well controlled with current medical therapy.  4. Pulmonary hypertension ([place]) Do not suspect primary pulmonary hypertension, suspect secondary to obesity and sleep apnea.  Would recommend conservative measures with weight loss.  We will start him on Aldactone 25 mg daily.  Will check BMP in 10 days for surveillance.  I will request labs from PCP office for baseline kidney function.  5. OSA on CPAP Compliant daily.  6.  History of laparoscopic adjustable gastric banding Followed by Dr. Hassell Done.  Unfortunately has gained weight since procedure in 2010.  May need reevaluation.  7.  Bilateral leg edema Question if secondary to amlodipine.  Has 2+ leg edema on exam today despite wearing support stockings.  I will decrease his dose of amlodipine to see if he has improvement in leg swelling.  As stated above, starting Aldactone 25 mg daily.  Plan: Test results were discussed with the patient.  He was provided with patient instructions regarding his further testing and medication changes.  Counseled on the importance of weight loss.  I will see him back in 6 weeks for follow-up on hypertension and to also discuss CT results.    Jeri Lager, MSN, APRN, FNP-C [facility], [place] Office: [phone] Fax: [phone]

## 2018-09-20 ENCOUNTER — Ambulatory Visit (INDEPENDENT_AMBULATORY_CARE_PROVIDER_SITE_OTHER): Payer: Medicare Other | Admitting: Cardiology

## 2018-09-20 ENCOUNTER — Encounter: Payer: Self-pay | Admitting: Cardiology

## 2018-09-20 ENCOUNTER — Other Ambulatory Visit: Payer: Self-pay

## 2018-09-20 VITALS — BP 134/75 | HR 57 | Ht 72.0 in | Wt 299.0 lb

## 2018-09-20 DIAGNOSIS — I10 Essential (primary) hypertension: Secondary | ICD-10-CM | POA: Diagnosis not present

## 2018-09-20 DIAGNOSIS — Z9884 Bariatric surgery status: Secondary | ICD-10-CM | POA: Diagnosis not present

## 2018-09-20 DIAGNOSIS — Z9989 Dependence on other enabling machines and devices: Secondary | ICD-10-CM

## 2018-09-20 DIAGNOSIS — Z6841 Body Mass Index (BMI) 40.0 and over, adult: Secondary | ICD-10-CM | POA: Diagnosis not present

## 2018-09-20 DIAGNOSIS — R600 Localized edema: Secondary | ICD-10-CM

## 2018-09-20 DIAGNOSIS — G4733 Obstructive sleep apnea (adult) (pediatric): Secondary | ICD-10-CM | POA: Diagnosis not present

## 2018-09-20 DIAGNOSIS — I272 Pulmonary hypertension, unspecified: Secondary | ICD-10-CM

## 2018-09-20 DIAGNOSIS — I77810 Thoracic aortic ectasia: Secondary | ICD-10-CM

## 2018-09-20 MED ORDER — AMLODIPINE BESYLATE 5 MG PO TABS: 5.0000 mg | ORAL_TABLET | Freq: Every day | ORAL | 0 refills | Status: DC

## 2018-09-20 MED ORDER — SPIRONOLACTONE 25 MG PO TABS: 25.0000 mg | ORAL_TABLET | Freq: Every day | ORAL | 1 refills | Status: DC

## 2018-09-20 NOTE — Patient Instructions (Signed)
Decrease amlodipine dose back to 5 mg daily as possible side effect of leg swelling (cut pill in half)  Start Aldactone 25 mg daily  Check labs (kidney function) in 10 days  Get CT of chest to follow up on aortic root dilation Slight increase in aortic root size compared to 1 year ago from 4.3 cm to 4.5 cm  Work on losing weight with diet and exercise  Follow up in 6 weeks

## 2018-10-04 ENCOUNTER — Other Ambulatory Visit: Payer: Self-pay | Admitting: Cardiology

## 2018-10-04 DIAGNOSIS — I10 Essential (primary) hypertension: Secondary | ICD-10-CM | POA: Diagnosis not present

## 2018-10-05 LAB — BASIC METABOLIC PANEL
BUN/Creatinine Ratio: 17 (ref 10–24)
BUN: 16 mg/dL (ref 8–27)
CALCIUM: 9.1 mg/dL (ref 8.6–10.2)
CO2: 27 mmol/L (ref 20–29)
Chloride: 103 mmol/L (ref 96–106)
Creatinine, Ser: 0.94 mg/dL (ref 0.76–1.27)
GFR calc Af Amer: 92 mL/min/{1.73_m2} (ref >59)
GFR calc non Af Amer: 80 mL/min/{1.73_m2} (ref >59)
Glucose: 109 mg/dL — ABNORMAL HIGH (ref 65–99)
Potassium: 4.8 mmol/L (ref 3.5–5.2)
Sodium: 142 mmol/L (ref 134–144)

## 2018-10-10 ENCOUNTER — Other Ambulatory Visit: Payer: Self-pay

## 2018-10-10 ENCOUNTER — Ambulatory Visit: Payer: Medicare Other | Admitting: Emergency Medicine

## 2018-10-10 ENCOUNTER — Encounter: Payer: Self-pay | Admitting: Emergency Medicine

## 2018-10-10 VITALS — BP 140/60 | HR 82 | Temp 98.7°F | Resp 16 | Ht 71.0 in | Wt 300.6 lb

## 2018-10-10 DIAGNOSIS — Z024 Encounter for examination for driving license: Secondary | ICD-10-CM

## 2018-10-10 NOTE — Patient Instructions (Addendum)
   If you have lab work done today you will be contacted with your lab results within the next 2 weeks.  If you have not heard from us then please contact us. The fastest way to get your results is to register for My Chart.   IF you received an x-ray today, you will receive an invoice from [facility]. Please contact [facility] at [phone] with questions or concerns regarding your invoice.   IF you received labwork today, you will receive an invoice from [facility]. Please contact [facility] at [phone] with questions or concerns regarding your invoice.   Our billing staff will not be able to assist you with questions regarding bills from these companies.  You will be contacted with the lab results as soon as they are available. The fastest way to get your results is to activate your My Chart account. Instructions are located on the last page of this paperwork. If you have not heard from us regarding the results in 2 weeks, please contact this office.     Health Maintenance After Age 65 After age 65, you are at a higher risk for certain long-term diseases and infections as well as injuries from falls. Falls are a major cause of broken bones and head injuries in people who are older than age 65. Getting regular preventive care can help to keep you healthy and well. Preventive care includes getting regular testing and making lifestyle changes as recommended by your health care provider. Talk with your health care provider about:  Which screenings and tests you should have. A screening is a test that checks for a disease when you have no symptoms.  A diet and exercise plan that is right for you. What should I know about screenings and tests to prevent falls? Screening and testing are the best ways to find a health problem early. Early diagnosis and treatment give you the best chance of managing medical conditions that are common after age 65. Certain conditions and  lifestyle choices may make you more likely to have a fall. Your health care provider may recommend:  Regular vision checks. Poor vision and conditions such as cataracts can make you more likely to have a fall. If you wear glasses, make sure to get your prescription updated if your vision changes.  Medicine review. Work with your health care provider to regularly review all of the medicines you are taking, including over-the-counter medicines. Ask your health care provider about any side effects that may make you more likely to have a fall. Tell your health care provider if any medicines that you take make you feel dizzy or sleepy.  Osteoporosis screening. Osteoporosis is a condition that causes the bones to get weaker. This can make the bones weak and cause them to break more easily.  Blood pressure screening. Blood pressure changes and medicines to control blood pressure can make you feel dizzy.  Strength and balance checks. Your health care provider may recommend certain tests to check your strength and balance while standing, walking, or changing positions.  Foot health exam. Foot pain and numbness, as well as not wearing proper footwear, can make you more likely to have a fall.  Depression screening. You may be more likely to have a fall if you have a fear of falling, feel emotionally low, or feel unable to do activities that you used to do.  Alcohol use screening. Using too much alcohol can affect your balance and may make you more likely to   have a fall. What actions can I take to lower my risk of falls? General instructions  Talk with your health care provider about your risks for falling. Tell your health care provider if: ? You fall. Be sure to tell your health care provider about all falls, even ones that seem minor. ? You feel dizzy, sleepy, or off-balance.  Take over-the-counter and prescription medicines only as told by your health care provider. These include any  supplements.  Eat a healthy diet and maintain a healthy weight. A healthy diet includes low-fat dairy products, low-fat (lean) meats, and fiber from whole grains, beans, and lots of fruits and vegetables. Home safety  Remove any tripping hazards, such as rugs, cords, and clutter.  Install safety equipment such as grab bars in bathrooms and safety rails on stairs.  Keep rooms and walkways well-lit. Activity   Follow a regular exercise program to stay fit. This will help you maintain your balance. Ask your health care provider what types of exercise are appropriate for you.  If you need a cane or walker, use it as recommended by your health care provider.  Wear supportive shoes that have nonskid soles. Lifestyle  Do not drink alcohol if your health care provider tells you not to drink.  If you drink alcohol, limit how much you have: ? 0-1 drink a day for women. ? 0-2 drinks a day for men.  Be aware of how much alcohol is in your drink. In the [place] one drink equals one typical bottle of beer (12 oz), one-half glass of wine (5 oz), or one shot of hard liquor (1 oz).  Do not use any products that contain nicotine or tobacco, such as cigarettes and e-cigarettes. If you need help quitting, ask your health care provider. Summary  Having a healthy lifestyle and getting preventive care can help to protect your health and wellness after age 6.  Screening and testing are the best way to find a health problem early and help you avoid having a fall. Early diagnosis and treatment give you the best chance for managing medical conditions that are more common for people who are older than age 47.  Falls are a major cause of broken bones and head injuries in people who are older than age 26. Take precautions to prevent a fall at home.  Work with your health care provider to learn what changes you can make to improve your health and wellness and to prevent falls. This information is not intended  to replace advice given to you by your health care provider. Make sure you discuss any questions you have with your health care provider. Document Released: 05/05/2017 Document Revised: 05/05/2017 Document Reviewed: 05/05/2017 Elsevier Interactive Patient Education  2019 [employer].

## 2018-10-10 NOTE — Progress Notes (Signed)
This patient presents for DOT examination for fitness for duty. He has no concerns or complaints at this time. He is using his CPAP machine every day.  He is here with his paperwork from [place].  This shows a use of 100%. Compliant with taking his blood pressure medications.  Medical History:  1. Head/Brain Injuries, disorders or illnesses no  2. Seizures, epilepsy no  3. Eye disorders or impaired vision (except corrective lenses) no  4. Ear disorders, loss of hearing or balance yes  5. Heart disease or heart attack, other cardiovascular condition no  6. Heart surgery (valve replacement/bypass, angioplasty, pacemaker/defribrillator) no  7. High blood pressure yes  8. High cholesterol no  9. Chronic cough, shortness of breath or other breathing problems no  10. Lung disease (emphysema, asthma or chronic bronchitis) no  11. Kidney disease, dialysis no  12. Digestive problems  no  13. Diabetes or elevated blood sugar no                      If yes to #13, Insulin use n/a  14. Nervious or psychiatric disorders, e.g., severe depression no  15. Fainting or syncope no  16. Dizziness, headaches, numbness, tingling or memory loss no  17. Unexplained weight loss no  18. Stroke, TIA or paralysis no  19. Missing or impaired hand, arm, foot, leg, finger, toe no  20. Spinal injury or disease no  21. Bone, muscles or nerve problems no  22. Blood clots or bleeding bleeding disorders no  23. Cancer no  24. Chronic infection or other chronic diseases no  25. Sleep disorders, pauses in breathing while asleep, daytime sleepiness, loud snoring yes  26. Have you ever had a sleep test? yes  27.  Have you ever spent a night in the hospital? yes  28. Have you ever had a broken bone? no  29. Have you or or do you use tobacco products? no  30. Regular, frequent alcohol use no  31. Illegal substance use within the past 2 years no  32.  Have you ever failed a drug test or been dependent on an  illegal substance? no   Current Medications: Prior to Admission medications   Medication Sig Start Date End Date Taking? Authorizing Provider  alfuzosin (UROXATRAL) 10 MG 24 hr tablet Take 10 mg by mouth daily.    Yes [provider]  amLODipine (NORVASC) 5 MG tablet Take 1 tablet (5 mg total) by mouth daily. 09/20/18 12/19/18 Yes Miquel Dunn, NP  aspirin 325 MG EC tablet Take 325 mg by mouth daily.   Yes [provider]  Cholecalciferol (VITAMIN D3) 125 MCG (5000 UT) TABS Take 1 tablet by mouth daily.   Yes [provider]  DHEA 50 MG TABS Take 1 tablet by mouth daily.   Yes [provider]  furosemide (LASIX) 40 MG tablet Take 40 mg by mouth every morning.  04/07/11  Yes [provider]  Methylsulfonylmethane (MSM) 1000 MG TABS Take 3 tablets by mouth daily.   Yes [provider]  Multiple Vitamin (MULTIVITAMIN) tablet Take 1 tablet by mouth daily.   Yes [provider]  OVER THE COUNTER MEDICATION Stool softener daily   Yes [provider]  pantoprazole (PROTONIX) 20 MG tablet Take 40 mg by mouth daily.    Yes [provider]  Pregnenolone POWD Take by mouth daily.   Yes [provider]  spironolactone (ALDACTONE) 25 MG tablet  Take 1 tablet (25 mg total) by mouth daily. 09/20/18 12/19/18 Yes Miquel Dunn, NP  tadalafil (CIALIS) 5 MG tablet Take 5 mg by mouth daily as needed for erectile dysfunction.   Yes [provider]  telmisartan (MICARDIS) 80 MG tablet Take 80 mg by mouth every morning.   Yes [provider]  thiamine (VITAMIN B-1) 100 MG tablet Take 100 mg by mouth daily as needed.    Yes [provider]  Zinc 50 MG TABS Take 1 tablet by mouth daily.   Yes [provider]    Medical Examiner's Comments on Health History:  Well-controlled chronic medical problems.  Compliant with medications and CPAP.  TESTING:  No exam data present   Monocular Vision: No.  Hearing Aid used for test: Yes.   Hearing Aid required to to meet standard: Yes.    BP (!) 152/81   Pulse 82   Temp 98.7 F (37.1 C) (Oral)   Resp 16   Ht 5\' 11"  (1.803 m)   Wt (!) 300 lb 9.6 oz (136.4 kg)   SpO2 96%   BMI 41.93 kg/m Repeat blood pressure: 140/60. Pulse rate is regular  Comments: u/a - protein trace, blood - trace, spg -1.015 glucose - negative  PHYSICAL EXAMINATION:  General Appearance Not markedly obese. No tremor, signs of alcoholism, problem drinking or drug abuse.   Skin Warm, dry and intact.  Eyes Pupils are equal, round and reactive to light and accommodation, extraocular movements are intact. No exophthalmos, no nystagmus.   Ears Normal external ears. External canal without occlusion. No scarring of the TM. No perforation of the TM.  Mouth and Throat Clear and moist. No irremedial deformities likely to interfere with breathing or swallowing.  Heart No murmurs, extra sounds, evidence of cardiomegaly. No pacemaker. No implantable defibrillator.  Lungs and Chest (excluding breasts) Normal chest expansion, respiratory rate, breath sounds. No cyanosis.  Abdomen and Viscera No liver enlargement. No splenic enlargement. No masses, bruits, hernias or significant abdominal wall weakness.  Genitourinary  No inguinal or femoral hernia.  Spine and other musculoskeletal No tenderness, no limitation of motion, no deformities. No evidence of previous surgery.  Extremities No loss or impairment of leg, foot, toe, arm, hand, finger. No perceptible limp, deformities, atrophy, weakness, paralysis, clubbing, edema, hypotonia. Patient has sufficient grasp and prehension to maintain steering wheel grip. Patient has sufficient mobility and strength in the lower limbs to operate pedals properly.  Neurologic Normal equilibrium, coordination, speech pattern. No paresthesia, asymmetry of deep tendon reflexes, sensory or positional abnormalities. No abnormality of  patellar or Babinski's reflexes.  Gait Not antalgic or ataxic  Vascular Normal pulses. No carotid or arterial bruits. No varicose veins.    Meets standards, but periodic monitoring required due to: HTN and OSA  Driver qualified only for: 1 year    Certification expires 10/10/2019

## 2018-10-12 ENCOUNTER — Other Ambulatory Visit: Payer: Self-pay | Admitting: Cardiology

## 2018-11-02 NOTE — Progress Notes (Signed)
Subjective:   [name] [name], male    DOB: 1945/09/29, 73 y.o.   MRN: [id number]  Deland Pretty, MD:  Chief Complaint  Patient presents with   Hypertension   Follow-up   This visit type was conducted due to national recommendations for restrictions regarding the COVID-19 Pandemic (e.g. social distancing).  This format is felt to be most appropriate for this patient at this time.  All issues noted in this document were discussed and addressed.  No physical exam was performed (except for noted visual exam findings with Telehealth visits).  The patient has consented to conduct a Telehealth visit and understands insurance will be billed.   I discussed the limitations of evaluation and management by telemedicine and the availability of in person appointments. The patient expressed understanding and agreed to proceed.  Virtual Visit via Video Note is as below  I connected with [name] [name], on 11/03/18 at 0850 by a video enabled telemedicine application and verified that I am speaking with the correct person using two identifiers.     I have discussed with her regarding the safety during COVID Pandemic and steps and precautions including social distancing with the patient.    HPI: [name] [name]  is a 74 y.o. male  With hypertension, type 2 diabetes, lumbar spinal stenosis, obstructive sleep apnea on CPAP, aortic root dilation, and s/p gastric banding surgery in 2010.   Patient was last seen 6 weeks ago for follow up on aortic root dilation. Wife is present today for video conference. He previously was noted to have aortic root dilation of 4.3 cm on echocardiogram; however, in Feb 2020 there was slight progression of aortic root dilation to 4.5 cm, and also moderate pulmonary hypertension with PA pressure of 50 mmHg that was new compared to previous echocardiogram.  CTA of chest was ordered for follow up, but was not performed due to COVID situation. Amlodipine was decreased at his last  office visit as possible etiology for leg swelling. He was also started on aldactone 43m daily. Leg swelling has improved with this.  Patient is without complaints today.  He denies any chest pain or worsening shortness of breath. Shortness of breath has actually improved with walking more. Has claudication symptoms felt to be neurological from spinal stenosis.  Has had negative vascular work-up in the past. Has leg edema that he states improved with support stockings.  He is compliant with CPAP daily.  Was able to come off diabetes medication after lap band procedure.  Patient reports he lost 90 pounds after his weight loss procedure; however, has gained 40 to 50 pounds over the last 1 to 2 years. He has started regular walking and tolerates this well. Former tobacco use in 1970s.    Past Medical History:  Diagnosis Date   Difficulty urinating    prostate problem   GERD (gastroesophageal reflux disease)    HIATAL HERNIA REPAIRED -with lap band 2 years ago NO LONGER HAS GERD   History of elevated glucose    IN THE PAST - NO PROBLEMS SINCE GASTRIC BANDING WEIGHT LOSS    Hypertension    resolved with lap band 2 years ago   Sleep apnea    uses cpap setting is 12    Past Surgical History:  Procedure Laterality Date   COLONOSCOPY  07/17/11   CYSTOSCOPY W/ URETERAL STENT PLACEMENT  07/18/2011   Procedure: CYSTOSCOPY WITH STENT REPLACEMENT;  Surgeon: TAlexis Frock  Location: [facility];  Service:  Urology;  Laterality: Left;   GASTRIC RESTRICTION SURGERY  12/11/2008   lap band   HEMORRHOID SURGERY N/A 08/04/2013   Procedure: EUA,HEMORRHOIDECTOMY;  Surgeon: [name], MD;  Location: [facility];  Service: General;  Laterality: N/A;   LAPAROSCOPIC GASTRIC BANDING  12/11/2008   LITHOTRIPSY      Family History  Problem Relation Age of Onset   Cancer Mother        ovarian   Diabetes Mother    Dementia Mother    Other Father        MVA    Social History   Socioeconomic  History   Marital status: Married    Spouse name: [name]   Number of children: 2   Years of education: 12   Highest education level: Not on file  Occupational History    Comment: retired, natural Wellsite geologist strain: Not on file   Food insecurity:    Worry: Not on file    Inability: Not on file   Transportation needs:    Medical: Not on file    Non-medical: Not on file  Tobacco Use   Smoking status: Former Smoker    Packs/day: 2.00    Years: 8.00    Pack years: 16.00    Types: Cigarettes    Last attempt to quit: 04/05/1971    Years since quitting: 47.6   Smokeless tobacco: Former Systems developer  Substance and Sexual Activity   Alcohol use: Yes    Alcohol/week: 0.0 standard drinks    Comment: occas wine   Drug use: No   Sexual activity: Not on file  Lifestyle   Physical activity:    Days per week: Not on file    Minutes per session: Not on file   Stress: Not on file  Relationships   Social connections:    Talks on phone: Not on file    Gets together: Not on file    Attends religious service: Not on file    Active member of club or organization: Not on file    Attends meetings of clubs or organizations: Not on file    Relationship status: Not on file   Intimate partner violence:    Fear of current or ex partner: Not on file    Emotionally abused: Not on file    Physically abused: Not on file    Forced sexual activity: Not on file  Other Topics Concern   Not on file  Social History Narrative   Married, lives at home with wife, [name].  Children 2.  Education 12th grade.  Reitired.     Caffeine use- coffee 1 cups daily    Current Meds  Medication Sig   alfuzosin (UROXATRAL) 10 MG 24 hr tablet Take 10 mg by mouth daily.    amLODipine (NORVASC) 5 MG tablet Take 1 tablet (5 mg total) by mouth daily.   aspirin 325 MG EC tablet Take 325 mg by mouth daily.   Cholecalciferol (VITAMIN D3) 125 MCG (5000 UT) TABS Take 1  tablet by mouth daily.   DHEA 50 MG TABS Take 1 tablet by mouth daily.   furosemide (LASIX) 40 MG tablet Take 40 mg by mouth every morning.    Methylsulfonylmethane (MSM) 1000 MG TABS Take 3 tablets by mouth daily.   Multiple Vitamin (MULTIVITAMIN) tablet Take 1 tablet by mouth daily.   OVER THE COUNTER MEDICATION Stool softener daily   pantoprazole (PROTONIX) 20 MG tablet Take  40 mg by mouth daily.    Pregnenolone POWD Take by mouth daily.   spironolactone (ALDACTONE) 25 MG tablet TAKE 1 TABLET BY MOUTH EVERY DAY   tadalafil (CIALIS) 5 MG tablet Take 5 mg by mouth daily as needed for erectile dysfunction.   telmisartan (MICARDIS) 80 MG tablet Take 80 mg by mouth every morning.   thiamine (VITAMIN B-1) 100 MG tablet Take 100 mg by mouth daily as needed.    Zinc 50 MG TABS Take 1 tablet by mouth daily.     Review of Systems  Constitution: Negative for decreased appetite, malaise/fatigue, weight gain and weight loss.  Eyes: Negative for visual disturbance.  Cardiovascular: Positive for claudication (spinal stenosis; stable), dyspnea on exertion (improved) and leg swelling (end of the day; improved with support stockings). Negative for chest pain, orthopnea, palpitations and syncope.  Respiratory: Negative for hemoptysis and wheezing.   Endocrine: Negative for cold intolerance and heat intolerance.  Hematologic/Lymphatic: Does not bruise/bleed easily.  Skin: Negative for nail changes.  Musculoskeletal: Negative for muscle weakness and myalgias.  Gastrointestinal: Negative for abdominal pain, change in bowel habit, nausea and vomiting.  Neurological: Negative for difficulty with concentration, dizziness, focal weakness and headaches.  Psychiatric/Behavioral: Negative for altered mental status and suicidal ideas.  All other systems reviewed and are negative.      Objective:     Blood pressure (!) 144/72, pulse 61, height 6' (1.829 m), weight 300 lb (136.1 kg).  Cardiac  studies:  EKG 08/16/2017: Sinus bradycardia at the rate of 53 bpm, normal axis, early R-wave regression V2, probably normal variant. Cannot exclude RVH. No evidence of ischemia, normal QT interval.  Echocardiogram 08/31/2018:  Left ventricle cavity is normal in size. Moderate concentric hypertrophy of the left ventricle. Normal global wall motion. Calculated EF 55%. Left atrial cavity is severely dilated. Moderate (Grade II) mitral regurgitation. Moderate tricuspid regurgitation. Moderate pulmonary hypertension. Estimated pulmonary artery systolic pressure 50 mmHg. The aortic root is dilated measuring 4.5 cm. Compared to previous study on 08/31/2017, there is mild increase in aortic root diameter and severity of pulmonary hypertension.  US Carotid Bilateral Cone 05/01/2015: Normal. Negative for stenosis.   [place] Lower Ext Art Bilat 11/23/2014: 1. Elevated the bilateral ankle-brachial indices consistent with medial arterial sclerosis. This is often seen in the setting of chronic diabetes or end-stage renal disease. 2. Dedicated duplex sonography of the bilateral lower extremities demonstrates no evidence of clinically significant peripheral arterial disease. There is very mild scattered atherosclerotic plaque.  Sleep Study: Positive for Sleep Apnea; Uses CPAP    Physical Exam  Constitutional: He is oriented to person, place, and time. He appears well-developed and well-nourished. No distress.  HENT:  Head: Normocephalic.  Pulmonary/Chest: Effort normal. No respiratory distress.  Neurological: He is alert and oriented to person, place, and time.  Psychiatric: He has a normal mood and affect. His behavior is normal.  Vitals reviewed.    Recent Labs:  BMP Latest Ref Rng & Units 10/04/2018 07/31/2013 07/21/2011  Glucose 65 - 99 mg/dL 109(H) 117(H) 160(H)  BUN 8 - 27 mg/dL '16 13 12  ' Creatinine 0.76 - 1.27 mg/dL 0.94 0.73 0.86  BUN/Creat Ratio 10 - 24 17 - -  Sodium 134 - 144 mmol/L 142 143  133(L)  Potassium 3.5 - 5.2 mmol/L 4.8 4.3 3.7  Chloride 96 - 106 mmol/L 103 103 100  CO2 20 - 29 mmol/L '27 28 26  ' Calcium 8.6 - 10.2 mg/dL 9.1 8.6 8.0(L)    08/05/2017:  CBC normal.  Creatinine 0.9, EGFR 87/106, potassium 4.3, CMP normal.  Hemoglobin A1c 6%.  Cholesterol 147, triglycerides 56, HDL 38, LDL 98.  EKG 09/20/2018: Probable sinus bradycardia at 56 bpm, 1 PVC, normal axis, early R wave progression. Cannot exlcude RVH. No evidence of ischemia, mormal QT interval. No change from EKG 08/16/2017.     Assessment & Recommendations:  1. Aortic root dilation [facility]) He is scheduled for CTA of chest in June for further evaluation of slight progression of aortic root dilation. I will change Micardis to Losartan for efficacy.   2. Morbid obesity with BMI of 40.0-44.9, adult ([place]) Weight is stable, but unfortunately he has not lost any weight. Encouraged him to continue to work on this. I have discussed the importance of weight loss.  Encourage portion control and regular exercise.  3. HTN (hypertension), benign Slightly elevated today, will start regular home monitoring. Continue with Aldactone, lasix, amlodipine, and start losartan. Kidney function remained stable.  4. Pulmonary hypertension ([place]) Do not suspect primary pulmonary hypertension, suspect secondary to obesity and sleep apnea.  Would recommend conservative measures with weight loss.  Dyspnea on exertion is improving with regular exercise. Leg edema has also improved with aldactone and decreasing dose of amlodipine.  5. Bilateral leg edema Improved with addition of aldactone and decreasing dose of amlodipine. Will continue the same. Continue to limit salt in diet.   Plan: Patient is doing well, will follow up after CTA of chest in June to discuss results and also follow up on hypertension.     Jeri Lager, MSN, APRN, FNP-C [facility], [place] Office: [phone] Fax: [phone]

## 2018-11-03 ENCOUNTER — Other Ambulatory Visit: Payer: Self-pay

## 2018-11-03 ENCOUNTER — Encounter: Payer: Self-pay | Admitting: Cardiology

## 2018-11-03 ENCOUNTER — Ambulatory Visit (INDEPENDENT_AMBULATORY_CARE_PROVIDER_SITE_OTHER): Payer: Medicare Other | Admitting: Cardiology

## 2018-11-03 VITALS — BP 144/72 | HR 61 | Ht 72.0 in | Wt 300.0 lb

## 2018-11-03 DIAGNOSIS — I272 Pulmonary hypertension, unspecified: Secondary | ICD-10-CM

## 2018-11-03 DIAGNOSIS — Z6841 Body Mass Index (BMI) 40.0 and over, adult: Secondary | ICD-10-CM | POA: Diagnosis not present

## 2018-11-03 DIAGNOSIS — I77810 Thoracic aortic ectasia: Secondary | ICD-10-CM | POA: Diagnosis not present

## 2018-11-03 DIAGNOSIS — R600 Localized edema: Secondary | ICD-10-CM

## 2018-11-03 DIAGNOSIS — I10 Essential (primary) hypertension: Secondary | ICD-10-CM | POA: Diagnosis not present

## 2018-11-03 MED ORDER — LOSARTAN POTASSIUM 100 MG PO TABS: 100.0000 mg | ORAL_TABLET | Freq: Every day | ORAL | 2 refills | Status: DC

## 2018-12-07 ENCOUNTER — Ambulatory Visit
Admission: RE | Admit: 2018-12-07 | Discharge: 2018-12-07 | Disposition: A | Discharge status: Home / Self Care | Payer: Medicare Other | Source: Ambulatory Visit | Attending: Cardiology | Admitting: Cardiology

## 2018-12-07 DIAGNOSIS — I77810 Thoracic aortic ectasia: Secondary | ICD-10-CM | POA: Diagnosis not present

## 2018-12-07 MED ORDER — IOPAMIDOL (ISOVUE-370) INJECTION 76%
75.0000 mL | Freq: Once | INTRAVENOUS | Status: AC | PRN
  Administered 2018-12-07: 13:00:00 75 mL via INTRAVENOUS

## 2018-12-21 ENCOUNTER — Encounter: Payer: Self-pay | Admitting: Cardiology

## 2018-12-21 ENCOUNTER — Other Ambulatory Visit: Payer: Self-pay

## 2018-12-21 ENCOUNTER — Ambulatory Visit (INDEPENDENT_AMBULATORY_CARE_PROVIDER_SITE_OTHER): Payer: Medicare Other | Admitting: Cardiology

## 2018-12-21 VITALS — BP 147/90 | Ht 72.0 in | Wt 300.0 lb

## 2018-12-21 DIAGNOSIS — I77810 Thoracic aortic ectasia: Secondary | ICD-10-CM

## 2018-12-21 DIAGNOSIS — R600 Localized edema: Secondary | ICD-10-CM | POA: Diagnosis not present

## 2018-12-21 DIAGNOSIS — I2510 Atherosclerotic heart disease of native coronary artery without angina pectoris: Secondary | ICD-10-CM

## 2018-12-21 DIAGNOSIS — I10 Essential (primary) hypertension: Secondary | ICD-10-CM | POA: Diagnosis not present

## 2018-12-21 MED ORDER — ROSUVASTATIN CALCIUM 10 MG PO TABS: 10.0000 mg | ORAL_TABLET | Freq: Every day | ORAL | 3 refills | Status: DC

## 2018-12-21 MED ORDER — SPIRONOLACTONE 50 MG PO TABS: 50.0000 mg | ORAL_TABLET | Freq: Every day | ORAL | 3 refills | Status: DC

## 2018-12-21 NOTE — Progress Notes (Signed)
Primary Physician:  Deland Pretty, MD   Patient ID: [name] [name], male    DOB: 1946-06-14, 73 y.o.   MRN: [id number]  Subjective:    Chief Complaint  Patient presents with  . Aortic Root Dilation    6 week f/u   This visit type was conducted due to national recommendations for restrictions regarding the COVID-19 Pandemic (e.g. social distancing).  This format is felt to be most appropriate for this patient at this time.  All issues noted in this document were discussed and addressed.  No physical exam was performed (except for noted visual exam findings with Telehealth visits).  The patient has consented to conduct a Telehealth visit and understands insurance will be billed.   I discussed the limitations of evaluation and management by telemedicine and the availability of in person appointments. The patient expressed understanding and agreed to proceed.  Virtual Visit via Video Note is as below  I connected with@, on 12/22/18 at 1024 by a video enabled telemedicine application and verified that I am speaking with the correct person using two identifiers.     I have discussed with her regarding the safety during COVID Pandemic and steps and precautions including social distancing with the patient.    HPI: [name]  is a 73 y.o. male  with hypertension, type 2 diabetes, lumbar spinal stenosis, obstructive sleep apnea on CPAP, aortic root dilation, and s/p gastric banding surgery in 2010.   Patient was last seen 6 weeks ago for follow up on aortic root dilation. Marland Kitchen He previously was noted to have aortic root dilation of 4.3 cm on echocardiogram; however, in Feb 2020 there was slight progression of aortic root dilation to 4.5 cm, and also moderate pulmonary hypertension with PA pressure of 50 mmHg that was new compared to previous echocardiogram. Underwent CTA of chest on 12/07/18 that correlated with his echocardiogram. He was started on Losartan at his last office visit.   Patient  is without complaints today.  He denies any chest pain or worsening shortness of breath. Shortness of breath has actually improved with walking more. Has claudication symptoms felt to be neurological from spinal stenosis.  Has had negative vascular work-up in the past. Has leg edema that he states improved with support stockings.  He is compliant with CPAP daily.  Was able to come off diabetes medication after lap band procedure.  Patient reports he lost 90 pounds after his weight loss procedure; however, has gained 40 to 50 pounds over the last 1 to 2 years. He has started regular walking and tolerates this well. Former tobacco use in 1970s.  Past Medical History:  Diagnosis Date  . Difficulty urinating    prostate problem  . GERD (gastroesophageal reflux disease)    HIATAL HERNIA REPAIRED -with lap band 2 years ago NO LONGER HAS GERD  . History of elevated glucose    IN THE PAST - NO PROBLEMS SINCE GASTRIC BANDING WEIGHT LOSS   . Hypertension    resolved with lap band 2 years ago  . Sleep apnea    uses cpap setting is 12    Past Surgical History:  Procedure Laterality Date  . COLONOSCOPY  07/17/11  . CYSTOSCOPY W/ URETERAL STENT PLACEMENT  07/18/2011   Procedure: CYSTOSCOPY WITH STENT REPLACEMENT;  Surgeon: Alexis Frock;  Location: [facility];  Service: Urology;  Laterality: Left;  Marland Kitchen GASTRIC RESTRICTION SURGERY  12/11/2008   lap band  . HEMORRHOID SURGERY N/A 08/04/2013   Procedure:  EUA,HEMORRHOIDECTOMY;  Surgeon: [name], MD;  Location: [facility];  Service: General;  Laterality: N/A;  . LAPAROSCOPIC GASTRIC BANDING  12/11/2008  . LITHOTRIPSY      Social History   Socioeconomic History  . Marital status: Married    Spouse name: [name]  . Number of children: 2  . Years of education: 21  . Highest education level: Not on file  Occupational History    Comment: retired, natural Careers adviser  Social Needs  . Financial resource strain: Not on file  . Food insecurity    Worry:  Not on file    Inability: Not on file  . Transportation needs    Medical: Not on file    Non-medical: Not on file  Tobacco Use  . Smoking status: Former Smoker    Packs/day: 2.00    Years: 8.00    Pack years: 16.00    Types: Cigarettes    Quit date: 04/05/1971    Years since quitting: 47.7  . Smokeless tobacco: Former Network engineer and Sexual Activity  . Alcohol use: Yes    Alcohol/week: 0.0 standard drinks    Comment: occas wine  . Drug use: No  . Sexual activity: Not on file  Lifestyle  . Physical activity    Days per week: Not on file    Minutes per session: Not on file  . Stress: Not on file  Relationships  . Social Herbalist on phone: Not on file    Gets together: Not on file    Attends religious service: Not on file    Active member of club or organization: Not on file    Attends meetings of clubs or organizations: Not on file    Relationship status: Not on file  . Intimate partner violence    Fear of current or ex partner: Not on file    Emotionally abused: Not on file    Physically abused: Not on file    Forced sexual activity: Not on file  Other Topics Concern  . Not on file  Social History Narrative   Married, lives at home with wife, [name].  Children 2.  Education 12th grade.  Reitired.     Caffeine use- coffee 1 cups daily    Review of Systems  Constitution: Negative for decreased appetite, malaise/fatigue, weight gain and weight loss.  Eyes: Negative for visual disturbance.  Cardiovascular: Positive for claudication (spinal stenosis; stable), dyspnea on exertion (improved) and leg swelling (end of the day; improved with support stockings). Negative for chest pain, orthopnea, palpitations and syncope.  Respiratory: Negative for hemoptysis and wheezing.   Endocrine: Negative for cold intolerance and heat intolerance.  Hematologic/Lymphatic: Does not bruise/bleed easily.  Skin: Negative for nail changes.  Musculoskeletal: Negative for  muscle weakness and myalgias.  Gastrointestinal: Negative for abdominal pain, change in bowel habit, nausea and vomiting.  Neurological: Negative for difficulty with concentration, dizziness, focal weakness and headaches.  Psychiatric/Behavioral: Negative for altered mental status and suicidal ideas.  All other systems reviewed and are negative.     Objective:  Blood pressure (!) 147/90, height 6' (1.829 m), weight 300 lb (136.1 kg). Body mass index is 40.69 kg/m.    Physical Exam  Constitutional: He is oriented to person, place, and time. Vital signs are normal. He appears well-developed and well-nourished. No distress.  HENT:  Head: Normocephalic and atraumatic.  Neck: Normal range of motion.  Cardiovascular: Normal rate, regular rhythm, normal heart sounds and  intact distal pulses.  Pulmonary/Chest: Effort normal and breath sounds normal. No accessory muscle usage. No respiratory distress.  Abdominal: Soft. Bowel sounds are normal.  Musculoskeletal: Normal range of motion.  Neurological: He is alert and oriented to person, place, and time.  Skin: Skin is warm and dry.  Psychiatric: He has a normal mood and affect. His behavior is normal.  Vitals reviewed.  Radiology:  CTA of chest 12/07/2018:  1. The tubular ascending thoracic aorta is enlarged, measuring 4.5 x 4.4 cm. The aortic valve measures 2.6 cm. The sinuses of Valsalva measure 4.7 cm. The descending thoracic aorta is slightly enlarged measuring 3.2 x 3.2 cm. Prior examination dated 2009 is significantly limited by motion artifact and lack of intravenous contrast, however these measurements appear to be minimally increased, approximately 4.4 x 4.1 cm in the tubular ascending thoracic aorta and 3.2 x 3.0 cm in the descending aorta. The sinuses of Valsalva and aortic root cannot be adequately evaluated on prior examination. 2.  Coronary artery disease.  Laboratory examination:    CMP Latest Ref Rng & Units 10/04/2018  07/31/2013 07/21/2011  Glucose 65 - 99 mg/dL 109(H) 117(H) 160(H)  BUN 8 - 27 mg/dL 16 13 12   Creatinine 0.76 - 1.27 mg/dL 0.94 0.73 0.86  Sodium 134 - 144 mmol/L 142 143 133(L)  Potassium 3.5 - 5.2 mmol/L 4.8 4.3 3.7  Chloride 96 - 106 mmol/L 103 103 100  CO2 20 - 29 mmol/L 27 28 26   Calcium 8.6 - 10.2 mg/dL 9.1 8.6 8.0(L)  Total Protein 6.0 - 8.3 g/dL - - -  Total Bilirubin 0.3 - 1.2 mg/dL - - -  Alkaline Phos 39 - 117 U/L - - -  AST 0 - 37 U/L - - -  ALT 0 - 53 U/L - - -   CBC Latest Ref Rng & Units 07/31/2013 07/21/2011 07/20/2011  WBC 4.0 - 10.5 K/uL 6.2 9.3 15.1(H)  Hemoglobin 13.0 - 17.0 g/dL 13.4 10.1(L) 10.4(L)  Hematocrit 39.0 - 52.0 % 39.6 29.9(L) 31.7(L)  Platelets 150 - 400 K/uL 174 122(L) 121(L)   Lipid Panel  No results found for: CHOL, TRIG, HDL, CHOLHDL, VLDL, LDLCALC, LDLDIRECT HEMOGLOBIN A1C No results found for: HGBA1C, MPG TSH No results for input(s): TSH in the last 8760 hours.  PRN Meds:. Medications Discontinued During This Encounter  Medication Reason  . spironolactone (ALDACTONE) 25 MG tablet Dose change   Current Meds  Medication Sig  . alfuzosin (UROXATRAL) 10 MG 24 hr tablet Take 10 mg by mouth daily.   Marland Kitchen amLODipine (NORVASC) 5 MG tablet Take 1 tablet (5 mg total) by mouth daily.  Marland Kitchen aspirin 325 MG EC tablet Take 325 mg by mouth daily.  . Cholecalciferol (VITAMIN D3) 125 MCG (5000 UT) TABS Take 1 tablet by mouth daily.  Marland Kitchen gabapentin (NEURONTIN) 100 MG capsule Take 100 mg by mouth 2 (two) times a day.  . losartan (COZAAR) 100 MG tablet Take 1 tablet (100 mg total) by mouth daily.  . Multiple Vitamin (MULTIVITAMIN) tablet Take 1 tablet by mouth daily.  Marland Kitchen OVER THE COUNTER MEDICATION Stool softener daily  . pantoprazole (PROTONIX) 20 MG tablet Take 20 mg by mouth daily.   . Pregnenolone POWD Take by mouth daily.  . tadalafil (CIALIS) 5 MG tablet Take 5 mg by mouth daily as needed for erectile dysfunction.  . [DISCONTINUED] spironolactone (ALDACTONE) 25  MG tablet TAKE 1 TABLET BY MOUTH EVERY DAY    Cardiac Studies:   Echocardiogram 08/31/2018:  Left ventricle cavity is normal in size. Moderate concentric hypertrophy of the left ventricle. Normal global wall motion. Calculated EF 55%. Left atrial cavity is severely dilated. Moderate (Grade II) mitral regurgitation. Moderate tricuspid regurgitation. Moderate pulmonary hypertension. Estimated pulmonary artery systolic pressure 50 mmHg. The aortic root is dilated measuring 4.5 cm. Compared to previous study on 08/31/2017, there is mild increase in aortic root diameter and severity of pulmonary hypertension.  US Carotid Bilateral Cone 05/01/2015: Normal. Negative for stenosis.   [place] Lower Ext Art Bilat 11/23/2014: 1. Elevated the bilateral ankle-brachial indices consistent with medial arterial sclerosis. This is often seen in the setting of chronic diabetes or end-stage renal disease. 2. Dedicated duplex sonography of the bilateral lower extremities demonstrates no evidence of clinically significant peripheral arterial disease. There is very mild scattered atherosclerotic plaque.  Sleep Study: Positive for Sleep Apnea; Uses CPAP  Assessment:     ICD-10-CM   1. Essential hypertension  B26 Basic Metabolic Panel (BMET)  2. Aortic root dilation (HCC)  I77.810   3. Bilateral leg edema  R60.0   4. Coronary artery calcification seen on CT scan  I25.10     EKG 09/20/2018: Probable sinus bradycardia at 56 bpm, 1 PVC, normal axis, early R wave progression. Cannot exlcude RVH. No evidence of ischemia, normal QT interval. No change from EKG 08/16/2017.  Recommendations:   Blood pressure continues to be slightly elevated, will increase Aldactone to 50 mg daily.  He is tolerating losartan well.  Will recommend repeating BMP in 2 weeks for surveillance of kidney function.  I discussed recently obtained CTA of the chest that correlates with recent echocardiogram. Aortic aneurysm in 2019 measured 4.3 cm,  and in 2020 by echocardiogram measured 4.5 cm.  Will consider repeating echocardiogram again in 6 months to 1 year.    He was noted to have coronary artery calcification on CT scan.  He is not currently on statin therapy, would recommend statin and aspirin therapy both for CAD and aneurysm. Will start him on Crestor 10 mg daily.  He is on 325 mg of ASA, which I have advised to change to 81 mg. He has not had stress testing, but is without symptoms of chest pain. Can consider stress testing at some point, but will hold off for now in view of lack of symptoms and COVID.   Again discussed the importance of weight loss to help control his risk factors. I will see him in the office in 2-3 months for follow up on hypertension.    Miquel Dunn, MSN, APRN, FNP-C [facility]. [place] Office: [phone] Fax: [phone]

## 2018-12-22 ENCOUNTER — Encounter: Payer: Self-pay | Admitting: Cardiology

## 2019-01-04 ENCOUNTER — Other Ambulatory Visit: Payer: Self-pay | Admitting: Cardiology

## 2019-01-04 DIAGNOSIS — I10 Essential (primary) hypertension: Secondary | ICD-10-CM | POA: Diagnosis not present

## 2019-01-05 LAB — BASIC METABOLIC PANEL
BUN/Creatinine Ratio: 22 (ref 10–24)
BUN: 18 mg/dL (ref 8–27)
CO2: 21 mmol/L (ref 20–29)
Calcium: 8.9 mg/dL (ref 8.6–10.2)
Chloride: 107 mmol/L — ABNORMAL HIGH (ref 96–106)
Creatinine, Ser: 0.83 mg/dL (ref 0.76–1.27)
GFR calc Af Amer: 100 mL/min/{1.73_m2} (ref >59)
GFR calc non Af Amer: 87 mL/min/{1.73_m2} (ref >59)
Glucose: 117 mg/dL — ABNORMAL HIGH (ref 65–99)
Potassium: 4.8 mmol/L (ref 3.5–5.2)
Sodium: 141 mmol/L (ref 134–144)

## 2019-01-29 ENCOUNTER — Other Ambulatory Visit: Payer: Self-pay | Admitting: Cardiology

## 2019-01-31 DIAGNOSIS — Z125 Encounter for screening for malignant neoplasm of prostate: Secondary | ICD-10-CM | POA: Diagnosis not present

## 2019-01-31 DIAGNOSIS — E669 Obesity, unspecified: Secondary | ICD-10-CM | POA: Diagnosis not present

## 2019-01-31 DIAGNOSIS — I10 Essential (primary) hypertension: Secondary | ICD-10-CM | POA: Diagnosis not present

## 2019-01-31 DIAGNOSIS — E118 Type 2 diabetes mellitus with unspecified complications: Secondary | ICD-10-CM | POA: Diagnosis not present

## 2019-02-06 DIAGNOSIS — E875 Hyperkalemia: Secondary | ICD-10-CM | POA: Diagnosis not present

## 2019-02-14 DIAGNOSIS — Z0000 Encounter for general adult medical examination without abnormal findings: Secondary | ICD-10-CM | POA: Diagnosis not present

## 2019-02-14 DIAGNOSIS — E7800 Pure hypercholesterolemia, unspecified: Secondary | ICD-10-CM | POA: Diagnosis not present

## 2019-02-14 DIAGNOSIS — R7989 Other specified abnormal findings of blood chemistry: Secondary | ICD-10-CM | POA: Diagnosis not present

## 2019-02-14 DIAGNOSIS — I10 Essential (primary) hypertension: Secondary | ICD-10-CM | POA: Diagnosis not present

## 2019-02-14 DIAGNOSIS — E559 Vitamin D deficiency, unspecified: Secondary | ICD-10-CM | POA: Diagnosis not present

## 2019-03-02 DIAGNOSIS — L300 Nummular dermatitis: Secondary | ICD-10-CM | POA: Diagnosis not present

## 2019-03-02 DIAGNOSIS — D485 Neoplasm of uncertain behavior of skin: Secondary | ICD-10-CM | POA: Diagnosis not present

## 2019-03-02 DIAGNOSIS — L814 Other melanin hyperpigmentation: Secondary | ICD-10-CM | POA: Diagnosis not present

## 2019-03-02 DIAGNOSIS — D229 Melanocytic nevi, unspecified: Secondary | ICD-10-CM | POA: Diagnosis not present

## 2019-03-02 DIAGNOSIS — D1801 Hemangioma of skin and subcutaneous tissue: Secondary | ICD-10-CM | POA: Diagnosis not present

## 2019-03-02 DIAGNOSIS — L821 Other seborrheic keratosis: Secondary | ICD-10-CM | POA: Diagnosis not present

## 2019-03-03 DIAGNOSIS — L821 Other seborrheic keratosis: Secondary | ICD-10-CM | POA: Diagnosis not present

## 2019-03-15 ENCOUNTER — Other Ambulatory Visit: Payer: Self-pay | Admitting: Cardiology

## 2019-03-21 ENCOUNTER — Encounter: Payer: Self-pay | Admitting: Cardiology

## 2019-03-21 ENCOUNTER — Other Ambulatory Visit: Payer: Self-pay

## 2019-03-21 ENCOUNTER — Ambulatory Visit (INDEPENDENT_AMBULATORY_CARE_PROVIDER_SITE_OTHER): Payer: Medicare Other | Admitting: Cardiology

## 2019-03-21 VITALS — BP 132/72 | HR 63 | Temp 98.6°F | Ht 72.0 in | Wt 289.4 lb

## 2019-03-21 DIAGNOSIS — I2510 Atherosclerotic heart disease of native coronary artery without angina pectoris: Secondary | ICD-10-CM

## 2019-03-21 DIAGNOSIS — R600 Localized edema: Secondary | ICD-10-CM | POA: Diagnosis not present

## 2019-03-21 DIAGNOSIS — Z6841 Body Mass Index (BMI) 40.0 and over, adult: Secondary | ICD-10-CM

## 2019-03-21 DIAGNOSIS — I10 Essential (primary) hypertension: Secondary | ICD-10-CM

## 2019-03-21 DIAGNOSIS — I77810 Thoracic aortic ectasia: Secondary | ICD-10-CM

## 2019-03-21 NOTE — Progress Notes (Signed)
Primary Physician:  Deland Pretty, MD   Patient ID: [name] [name], male    DOB: [birth year], 73 y.o.   MRN: [id number]  Subjective:    Chief Complaint  Patient presents with  . Hypertension  . Follow-up    3 month    HPI: [name] [name]  is a 73 y.o. male  with hypertension, type 2 diabetes, lumbar spinal stenosis, obstructive sleep apnea on CPAP, aortic root dilation, and s/p gastric banding surgery in 2010.   Patient was last seen 6 weeks ago for follow up on aortic root dilation. He previously was noted to have aortic root dilation of 4.3 cm on echocardiogram; however, in Feb 2020 there was slight progression of aortic root dilation to 4.5 cm, and also moderate pulmonary hypertension with PA pressure of 50 mmHg that was new compared to previous echocardiogram. Underwent CTA of chest on 12/07/18 that correlated with his echocardiogram. At his last office visit, Aldactone was increased for improved BP control. He was also started on Crestor for coronary calcification on CT scan.   Patient is without complaints today, states that he feels great.  He denies any chest pain or worsening shortness of breath. Shortness of breath has actually improved with walking more. Has claudication symptoms felt to be neurological from spinal stenosis.  Has had negative vascular work-up in the past. Has leg edema that he states improved with support stockings and increased dose of aldactone.  He is compliant with CPAP daily.  Was able to come off diabetes medication after lap band procedure.  Patient reports he lost 90 pounds after his weight loss procedure; however, has gained 40 to 50 pounds over the last 1 to 2 years. He has started regular walking and tolerates this well. Former tobacco use in 1970s.  Past Medical History:  Diagnosis Date  . Difficulty urinating    prostate problem  . GERD (gastroesophageal reflux disease)    HIATAL HERNIA REPAIRED -with lap band 2 years ago NO LONGER HAS GERD  .  History of elevated glucose    IN THE PAST - NO PROBLEMS SINCE GASTRIC BANDING WEIGHT LOSS   . Hypertension    resolved with lap band 2 years ago  . Sleep apnea    uses cpap setting is 12    Past Surgical History:  Procedure Laterality Date  . COLONOSCOPY  07/17/11  . CYSTOSCOPY W/ URETERAL STENT PLACEMENT  07/18/2011   Procedure: CYSTOSCOPY WITH STENT REPLACEMENT;  Surgeon: Alexis Frock;  Location: [facility];  Service: Urology;  Laterality: Left;  Marland Kitchen GASTRIC RESTRICTION SURGERY  12/11/2008   lap band  . HEMORRHOID SURGERY N/A 08/04/2013   Procedure: EUA,HEMORRHOIDECTOMY;  Surgeon: [name], MD;  Location: [facility];  Service: General;  Laterality: N/A;  . LAPAROSCOPIC GASTRIC BANDING  12/11/2008  . LITHOTRIPSY      Social History   Socioeconomic History  . Marital status: Married    Spouse name: [name]  . Number of children: 2  . Years of education: 43  . Highest education level: Not on file  Occupational History    Comment: retired, natural Careers adviser  Social Needs  . Financial resource strain: Not on file  . Food insecurity    Worry: Not on file    Inability: Not on file  . Transportation needs    Medical: Not on file    Non-medical: Not on file  Tobacco Use  . Smoking status: Former Smoker    Packs/day: 2.00  Years: 8.00    Pack years: 16.00    Types: Cigarettes    Quit date: 04/05/1971    Years since quitting: 47.9  . Smokeless tobacco: Former Network engineer and Sexual Activity  . Alcohol use: Yes    Alcohol/week: 0.0 standard drinks    Comment: occas wine  . Drug use: No  . Sexual activity: Not on file  Lifestyle  . Physical activity    Days per week: Not on file    Minutes per session: Not on file  . Stress: Not on file  Relationships  . Social Herbalist on phone: Not on file    Gets together: Not on file    Attends religious service: Not on file    Active member of club or organization: Not on file    Attends meetings of clubs or  organizations: Not on file    Relationship status: Not on file  . Intimate partner violence    Fear of current or ex partner: Not on file    Emotionally abused: Not on file    Physically abused: Not on file    Forced sexual activity: Not on file  Other Topics Concern  . Not on file  Social History Narrative   Married, lives at home with wife, [name].  Children 2.  Education 12th grade.  Reitired.     Caffeine use- coffee 1 cups daily    Review of Systems  Constitution: Negative for decreased appetite, malaise/fatigue, weight gain and weight loss.  Eyes: Negative for visual disturbance.  Cardiovascular: Positive for claudication (spinal stenosis; stable), dyspnea on exertion (improved) and leg swelling (end of the day; improved with support stockings). Negative for chest pain, orthopnea, palpitations and syncope.  Respiratory: Negative for hemoptysis and wheezing.   Endocrine: Negative for cold intolerance and heat intolerance.  Hematologic/Lymphatic: Does not bruise/bleed easily.  Skin: Negative for nail changes.  Musculoskeletal: Negative for muscle weakness and myalgias.  Gastrointestinal: Negative for abdominal pain, change in bowel habit, nausea and vomiting.  Neurological: Negative for difficulty with concentration, dizziness, focal weakness and headaches.  Psychiatric/Behavioral: Negative for altered mental status and suicidal ideas.  All other systems reviewed and are negative.     Objective:  Blood pressure 132/72, pulse 63, temperature 98.6 F (37 C), height 6' (1.829 m), weight 289 lb 6.4 oz (131.3 kg), SpO2 97 %. Body mass index is 39.25 kg/m.    Physical Exam  Constitutional: He is oriented to person, place, and time. Vital signs are normal. He appears well-developed and well-nourished. No distress.  HENT:  Head: Normocephalic and atraumatic.  Neck: Normal range of motion.  Cardiovascular: Normal rate, regular rhythm, normal heart sounds and intact distal pulses.   Pulmonary/Chest: Effort normal and breath sounds normal. No accessory muscle usage. No respiratory distress.  Abdominal: Soft. Bowel sounds are normal.  Musculoskeletal: Normal range of motion.  Neurological: He is alert and oriented to person, place, and time.  Skin: Skin is warm and dry.  Psychiatric: He has a normal mood and affect. His behavior is normal.  Vitals reviewed.  Radiology:  CTA of chest 12/07/2018:  1. The tubular ascending thoracic aorta is enlarged, measuring 4.5 x 4.4 cm. The aortic valve measures 2.6 cm. The sinuses of Valsalva measure 4.7 cm. The descending thoracic aorta is slightly enlarged measuring 3.2 x 3.2 cm. Prior examination dated 2009 is significantly limited by motion artifact and lack of intravenous contrast, however these measurements appear to be minimally  increased, approximately 4.4 x 4.1 cm in the tubular ascending thoracic aorta and 3.2 x 3.0 cm in the descending aorta. The sinuses of Valsalva and aortic root cannot be adequately evaluated on prior examination. 2.  Coronary artery disease.  Laboratory examination:    CMP Latest Ref Rng & Units 01/04/2019 10/04/2018 07/31/2013  Glucose 65 - 99 mg/dL 117(H) 109(H) 117(H)  BUN 8 - 27 mg/dL 18 16 13   Creatinine 0.76 - 1.27 mg/dL 0.83 0.94 0.73  Sodium 134 - 144 mmol/L 141 142 143  Potassium 3.5 - 5.2 mmol/L 4.8 4.8 4.3  Chloride 96 - 106 mmol/L 107(H) 103 103  CO2 20 - 29 mmol/L 21 27 28   Calcium 8.6 - 10.2 mg/dL 8.9 9.1 8.6  Total Protein 6.0 - 8.3 g/dL - - -  Total Bilirubin 0.3 - 1.2 mg/dL - - -  Alkaline Phos 39 - 117 U/L - - -  AST 0 - 37 U/L - - -  ALT 0 - 53 U/L - - -   CBC Latest Ref Rng & Units 07/31/2013 07/21/2011 07/20/2011  WBC 4.0 - 10.5 K/uL 6.2 9.3 15.1(H)  Hemoglobin 13.0 - 17.0 g/dL 13.4 10.1(L) 10.4(L)  Hematocrit 39.0 - 52.0 % 39.6 29.9(L) 31.7(L)  Platelets 150 - 400 K/uL 174 122(L) 121(L)   Lipid Panel  No results found for: CHOL, TRIG, HDL, CHOLHDL, VLDL, LDLCALC,  LDLDIRECT HEMOGLOBIN A1C No results found for: HGBA1C, MPG TSH No results for input(s): TSH in the last 8760 hours.  PRN Meds:. Medications Discontinued During This Encounter  Medication Reason  . aspirin 325 MG EC tablet Error  . DHEA 50 MG TABS Error  . Methylsulfonylmethane (MSM) 1000 MG TABS Error   Current Meds  Medication Sig  . alfuzosin (UROXATRAL) 10 MG 24 hr tablet Take 10 mg by mouth daily.   Marland Kitchen amLODipine (NORVASC) 5 MG tablet Take 1 tablet (5 mg total) by mouth daily. (Patient taking differently: Take 10 mg by mouth daily. )  . aspirin EC 81 MG tablet Take 81 mg by mouth daily.  . Cholecalciferol (VITAMIN D3) 125 MCG (5000 UT) TABS Take 1 tablet by mouth daily.  . furosemide (LASIX) 40 MG tablet Take 40 mg by mouth every morning.   . gabapentin (NEURONTIN) 100 MG capsule Take 100 mg by mouth 2 (two) times a day.  . losartan (COZAAR) 100 MG tablet TAKE 1 TABLET BY MOUTH EVERY DAY  . Multiple Vitamin (MULTIVITAMIN) tablet Take 1 tablet by mouth daily.  Marland Kitchen OVER THE COUNTER MEDICATION Stool softener daily  . pantoprazole (PROTONIX) 20 MG tablet Take 20 mg by mouth daily.   . Pregnenolone POWD Take by mouth daily.  . rosuvastatin (CRESTOR) 10 MG tablet TAKE 1 TABLET BY MOUTH EVERY DAY  . spironolactone (ALDACTONE) 50 MG tablet TAKE 1 TABLET BY MOUTH EVERY DAY  . tadalafil (CIALIS) 5 MG tablet Take 5 mg by mouth daily as needed for erectile dysfunction.  . thiamine (VITAMIN B-1) 100 MG tablet Take 100 mg by mouth daily as needed.   . Zinc 50 MG TABS Take 1 tablet by mouth daily.    Cardiac Studies:   Echocardiogram 08/31/2018:  Left ventricle cavity is normal in size. Moderate concentric hypertrophy of the left ventricle. Normal global wall motion. Calculated EF 55%. Left atrial cavity is severely dilated. Moderate (Grade II) mitral regurgitation. Moderate tricuspid regurgitation. Moderate pulmonary hypertension. Estimated pulmonary artery systolic pressure 50 mmHg. The  aortic root is dilated measuring 4.5 cm. Compared to  previous study on 08/31/2017, there is mild increase in aortic root diameter and severity of pulmonary hypertension.  US Carotid Bilateral Cone 05/01/2015: Normal. Negative for stenosis.   [place] Lower Ext Art Bilat 11/23/2014: 1. Elevated the bilateral ankle-brachial indices consistent with medial arterial sclerosis. This is often seen in the setting of chronic diabetes or end-stage renal disease. 2. Dedicated duplex sonography of the bilateral lower extremities demonstrates no evidence of clinically significant peripheral arterial disease. There is very mild scattered atherosclerotic plaque.  Sleep Study: Positive for Sleep Apnea; Uses CPAP  Assessment:     ICD-10-CM   1. Essential hypertension  I10 EKG 12-Lead  2. Coronary artery calcification seen on CT scan  I25.10 PCV MYOCARDIAL PERFUSION WO LEXISCAN  3. Aortic root dilation (HCC)  I77.810   4. Bilateral leg edema  R60.0   5. Morbid obesity with BMI of 40.0-44.9, adult ([place])  E66.01    Z68.41     EKG 03/21/2019: Probable sinus rhythm at 60 bpm, normal axis, early R wave progression. Cannot exlcude RVH. No evidence of ischemia, normal QT interval. No change from EKG 09/20/2018.  Recommendations:   Patient is an improvement in blood pressure with recent changes to his medications, will continue the same.  In view of coronary calcification seen on CT scan, he was started on Crestor at his last office visit.  He is tolerating this well.  He is scheduled to have annual physical in early 2021 will have labs performed at that time.  In view of coronary calcification, although asymptomatic, he does need further evaluation with exercise nuclear stress testing.  We will schedule for this.  He has been walking approximately 20 to 30 minutes daily without any exertional difficulty.  In regard to aortic root dilation, he has had slight progression by recent CTA, will need repeat evaluation in 1  year. Leg edema is minimal. Hopefully with continued exercise, he will be able to lose weight. I have stressed the importance of this. I will see him back in 6 months or sooner if needed.   Miquel Dunn, MSN, APRN, FNP-C [facility]. [place] Office: [phone] Fax: [phone]

## 2019-04-20 DIAGNOSIS — H43813 Vitreous degeneration, bilateral: Secondary | ICD-10-CM | POA: Diagnosis not present

## 2019-04-20 DIAGNOSIS — H2513 Age-related nuclear cataract, bilateral: Secondary | ICD-10-CM | POA: Diagnosis not present

## 2019-04-23 ENCOUNTER — Other Ambulatory Visit: Payer: Self-pay | Admitting: Cardiology

## 2019-05-01 ENCOUNTER — Ambulatory Visit (INDEPENDENT_AMBULATORY_CARE_PROVIDER_SITE_OTHER): Payer: Medicare Other

## 2019-05-01 ENCOUNTER — Other Ambulatory Visit: Payer: Self-pay

## 2019-05-01 DIAGNOSIS — I2510 Atherosclerotic heart disease of native coronary artery without angina pectoris: Secondary | ICD-10-CM | POA: Diagnosis not present

## 2019-05-15 DIAGNOSIS — H43813 Vitreous degeneration, bilateral: Secondary | ICD-10-CM | POA: Diagnosis not present

## 2019-05-15 DIAGNOSIS — H2513 Age-related nuclear cataract, bilateral: Secondary | ICD-10-CM | POA: Diagnosis not present

## 2019-07-10 DIAGNOSIS — Z23 Encounter for immunization: Secondary | ICD-10-CM | POA: Diagnosis not present

## 2019-08-08 DIAGNOSIS — E7800 Pure hypercholesterolemia, unspecified: Secondary | ICD-10-CM | POA: Diagnosis not present

## 2019-08-15 DIAGNOSIS — E7800 Pure hypercholesterolemia, unspecified: Secondary | ICD-10-CM | POA: Diagnosis not present

## 2019-08-15 DIAGNOSIS — E118 Type 2 diabetes mellitus with unspecified complications: Secondary | ICD-10-CM | POA: Diagnosis not present

## 2019-08-15 DIAGNOSIS — I10 Essential (primary) hypertension: Secondary | ICD-10-CM | POA: Diagnosis not present

## 2019-08-15 DIAGNOSIS — K219 Gastro-esophageal reflux disease without esophagitis: Secondary | ICD-10-CM | POA: Diagnosis not present

## 2019-08-20 ENCOUNTER — Ambulatory Visit: Payer: Medicare Other | Attending: Internal Medicine

## 2019-08-20 DIAGNOSIS — Z23 Encounter for immunization: Secondary | ICD-10-CM | POA: Insufficient documentation

## 2019-08-20 NOTE — Progress Notes (Signed)
   Covid-19 Vaccination Clinic  Name:  [name] [name]    MRN: [id number] DOB: 05/09/1946  08/20/2019  [name] was observed post Covid-19 immunization for 15 minutes without incidence. He was provided with Vaccine Information Sheet and instruction to access the V-Safe system.   [name] was instructed to call 911 with any severe reactions post vaccine: Marland Kitchen Difficulty breathing  . Swelling of your face and throat  . A fast heartbeat  . A bad rash all over your body  . Dizziness and weakness    Immunizations Administered    Name Date Dose VIS Date Route   Pfizer COVID-19 Vaccine 08/20/2019  8:44 AM 0.3 mL 06/16/2019 Intramuscular   Manufacturer: [place]   Lot: [id number]   [place]: [id number]

## 2019-09-11 ENCOUNTER — Ambulatory Visit: Payer: Medicare Other | Attending: Internal Medicine

## 2019-09-11 DIAGNOSIS — Z23 Encounter for immunization: Secondary | ICD-10-CM | POA: Insufficient documentation

## 2019-09-11 NOTE — Progress Notes (Signed)
   Covid-19 Vaccination Clinic  Name:  CLEOPHAS VERDERAME    MRN: [id number] DOB: [birth year]  09/11/2019  Mr. Rollinson was observed post Covid-19 immunization for 15 minutes without incident. He was provided with Vaccine Information Sheet and instruction to access the V-Safe system.   Mr. Bradney was instructed to call 911 with any severe reactions post vaccine: Marland Kitchen Difficulty breathing  . Swelling of face and throat  . A fast heartbeat  . A bad rash all over body  . Dizziness and weakness   Immunizations Administered    Name Date Dose VIS Date Route   Pfizer COVID-19 Vaccine 09/11/2019  4:41 PM 0.3 mL 06/16/2019 Intramuscular   Manufacturer: [place]   Lot: [id number]   [place]: [id number]

## 2019-09-19 ENCOUNTER — Encounter: Payer: Self-pay | Admitting: Cardiology

## 2019-09-19 ENCOUNTER — Ambulatory Visit: Payer: Medicare Other | Admitting: Cardiology

## 2019-09-19 ENCOUNTER — Other Ambulatory Visit: Payer: Self-pay

## 2019-09-19 VITALS — BP 145/76 | HR 61 | Temp 97.9°F | Resp 16 | Ht 72.0 in | Wt 303.6 lb

## 2019-09-19 DIAGNOSIS — I77819 Aortic ectasia, unspecified site: Secondary | ICD-10-CM | POA: Diagnosis not present

## 2019-09-19 DIAGNOSIS — I2510 Atherosclerotic heart disease of native coronary artery without angina pectoris: Secondary | ICD-10-CM

## 2019-09-19 DIAGNOSIS — Z87891 Personal history of nicotine dependence: Secondary | ICD-10-CM | POA: Diagnosis not present

## 2019-09-19 DIAGNOSIS — M79604 Pain in right leg: Secondary | ICD-10-CM

## 2019-09-19 DIAGNOSIS — M79605 Pain in left leg: Secondary | ICD-10-CM | POA: Diagnosis not present

## 2019-09-19 DIAGNOSIS — Z6841 Body Mass Index (BMI) 40.0 and over, adult: Secondary | ICD-10-CM

## 2019-09-19 DIAGNOSIS — G4733 Obstructive sleep apnea (adult) (pediatric): Secondary | ICD-10-CM | POA: Diagnosis not present

## 2019-09-19 DIAGNOSIS — I10 Essential (primary) hypertension: Secondary | ICD-10-CM

## 2019-09-19 DIAGNOSIS — Z9989 Dependence on other enabling machines and devices: Secondary | ICD-10-CM | POA: Diagnosis not present

## 2019-09-19 MED ORDER — LOSARTAN POTASSIUM 25 MG PO TABS: 12.5000 mg | ORAL_TABLET | Freq: Every evening | ORAL | 3 refills | Status: DC

## 2019-09-19 NOTE — Patient Instructions (Signed)
Please remember to bring in your medication bottles in at the next visit.   New Medications that were added at today's visit:  Losartan 12.5 mg p.o. every afternoon  Medications that were discontinued at today's visit: None  Office will call you to have the following tests scheduled:  ABI. CT scan in June 2021  Please get labs done today and 1 week after starting losartan at the nearest [place].  Please keep a blood pressure log and bring that in at  your next office visit. Call the office if the top number is consistently greater than 162mmHg.   Recommend follow up with your PCP as scheduled.

## 2019-09-19 NOTE — Progress Notes (Signed)
Primary Physician:  Deland Pretty, MD   Patient ID: [name] [name], male    DOB: 08/01/1945, 74 y.o.   MRN: [id number]  Subjective:    Chief Complaint  Patient presents with  . Hypertension  . Coronary Calcification  . Follow-up    6 month    HPI: [name] [name]  is a 74 y.o. male  with hypertension, lumbar spinal stenosis, obstructive sleep apnea on CPAP, aortic root dilation, advanced age, coronary artery calcification, former smoker, and obesity due to excess calories s/p gastric banding surgery in 2010 presents to the office with a chief complaint of 105-month follow-up for the management of hypertension and coronary artery calcification.  Since last office visit patient has not been hospitalized or seen in urgent care for cardiovascular symptoms.  Patient continues to walk 30 minutes a day 2-3 times per week without any effort related chest pain and his shortness of breath is chronic and stable.  Patient complains of bilateral leg pain left worse than the right.  He thinks is secondary to spinal stenosis as his vascular work-up was negative in the past.  Coronary artery calcification: Patient was noted to have coronary artery calcification and therefore underwent a stress test since last office visit.  Results of the stress test were reviewed with the patient in great detail findings noted below for further reference.  Medications reconciled.  Patient does not have any anginal discomfort.  And he continues to walk periodically without effort related symptoms.   Dilated aorta: On a surface echocardiogram he was found to have an aortic root of 4.3 cm and on a subsequent echocardiogram in February 2020 the aortic root was noted to be 4.5 cm.  He underwent CTA of the chest in June 2020 and the findings are noted below for further reference.  Had a detailed discussion in regards to the importance of blood pressure management to help prevent and/or delay the progression of aortic dilatation.   Patient states that his blood pressures at home are usually in the 130 to [id number] mmHg and diastolic blood pressures are in the 70 mmHg range.  Educated on the importance of a low-salt diet.  Patient was started on Aldactone 50 mg p.o. daily.  I do not have any recent labs for review.  He states that he had recent blood work done at his primary care.  We reached out to the primary care to have them fax over a copy of his blood work.   Past Medical History:  Diagnosis Date  . Coronary artery disease   . Difficulty urinating    prostate problem  . GERD (gastroesophageal reflux disease)    HIATAL HERNIA REPAIRED -with lap band 2 years ago NO LONGER HAS GERD  . History of elevated glucose    IN THE PAST - NO PROBLEMS SINCE GASTRIC BANDING WEIGHT LOSS   . Hyperlipidemia   . Hypertension    resolved with lap band 2 years ago  . Sleep apnea    uses cpap setting is 12    Past Surgical History:  Procedure Laterality Date  . COLONOSCOPY  07/17/11  . CYSTOSCOPY W/ URETERAL STENT PLACEMENT  07/18/2011   Procedure: CYSTOSCOPY WITH STENT REPLACEMENT;  Surgeon: Alexis Frock;  Location: [facility];  Service: Urology;  Laterality: Left;  Marland Kitchen GASTRIC RESTRICTION SURGERY  12/11/2008   lap band  . HEMORRHOID SURGERY N/A 08/04/2013   Procedure: EUA,HEMORRHOIDECTOMY;  Surgeon: [name], MD;  Location: [facility];  Service: General;  Laterality: N/A;  . LAPAROSCOPIC GASTRIC BANDING  12/11/2008  . LITHOTRIPSY      Social History   Socioeconomic History  . Marital status: Married    Spouse name: [name] [name]  . Number of children: 2  . Years of education: 81  . Highest education level: Not on file  Occupational History    Comment: retired, natural gas pipe line  Tobacco Use  . Smoking status: Former Smoker    Packs/day: 2.00    Years: 8.00    Pack years: 16.00    Types: Cigarettes    Quit date: 04/05/1971    Years since quitting: 48.4  . Smokeless tobacco: Former Network engineer and Sexual Activity  .  Alcohol use: Yes    Alcohol/week: 0.0 standard drinks    Comment: occas wine  . Drug use: No  . Sexual activity: Not on file  Other Topics Concern  . Not on file  Social History Narrative   Married, lives at home with wife, [name] [name].  Children 2.  Education 12th grade.  Reitired.     Caffeine use- coffee 1 cups daily   Social Determinants of Health   Financial Resource Strain:   . Difficulty of Paying Living Expenses:   Food Insecurity:   . Worried About Charity fundraiser in the Last Year:   . Arboriculturist in the Last Year:   Transportation Needs:   . Film/video editor (Medical):   Marland Kitchen Lack of Transportation (Non-Medical):   Physical Activity:   . Days of Exercise per Week:   . Minutes of Exercise per Session:   Stress:   . Feeling of Stress :   Social Connections:   . Frequency of Communication with Friends and Family:   . Frequency of Social Gatherings with Friends and Family:   . Attends Religious Services:   . Active Member of Clubs or Organizations:   . Attends Archivist Meetings:   Marland Kitchen Marital Status:   Intimate Partner Violence:   . Fear of Current or Ex-Partner:   . Emotionally Abused:   Marland Kitchen Physically Abused:   . Sexually Abused:     Review of Systems  Constitution: Negative for decreased appetite, malaise/fatigue, weight gain and weight loss.  Eyes: Negative for visual disturbance.  Cardiovascular: Positive for claudication (spinal stenosis; stable) and dyspnea on exertion (improved). Negative for chest pain, leg swelling (end of the day; improved with support stockings), orthopnea, palpitations and syncope.  Respiratory: Negative for hemoptysis and wheezing.   Endocrine: Negative for cold intolerance and heat intolerance.  Hematologic/Lymphatic: Does not bruise/bleed easily.  Skin: Negative for nail changes.  Musculoskeletal: Negative for muscle weakness and myalgias.  Gastrointestinal: Negative for abdominal pain, change in bowel habit,  nausea and vomiting.  Neurological: Negative for difficulty with concentration, dizziness, focal weakness and headaches.  Psychiatric/Behavioral: Negative for altered mental status and suicidal ideas.  All other systems reviewed and are negative.     Objective:  Blood pressure (!) 145/76, pulse 61, temperature 97.9 F (36.6 C), temperature source Temporal, resp. rate 16, height 6' (1.829 m), weight (!) 303 lb 9.6 oz (137.7 kg), SpO2 94 %. Body mass index is 41.18 kg/m.    Physical Exam  Constitutional: He is oriented to person, place, and time. Vital signs are normal. He appears well-developed and well-nourished. No distress.  HENT:  Head: Normocephalic and atraumatic.  Cardiovascular: Normal rate, regular rhythm, normal heart sounds and intact distal pulses.  Pulses:  Femoral pulses are 2+ on the right side and 2+ on the left side.      Popliteal pulses are 2+ on the right side and 2+ on the left side.       Dorsalis pedis pulses are 1+ on the right side and 1+ on the left side.       Posterior tibial pulses are 2+ on the right side and 2+ on the left side.  Pulmonary/Chest: Effort normal and breath sounds normal. No accessory muscle usage. No respiratory distress.  Abdominal: Soft. Bowel sounds are normal.  Musculoskeletal:        General: Normal range of motion.     Cervical back: Normal range of motion.  Neurological: He is alert and oriented to person, place, and time.  Skin: Skin is warm and dry.  Psychiatric: He has a normal mood and affect. His behavior is normal.  Vitals reviewed.  Radiology:  CTA of chest 12/07/2018:  1. The tubular ascending thoracic aorta is enlarged, measuring 4.5 x  4.4 cm. The aortic valve measures 2.6 cm. The sinuses of Valsalva measure 4.7 cm. The descending thoracic aorta is slightly enlarged measuring 3.2 x 3.2 cm. Prior examination dated 2009 is significantly limited by motion artifact and lack of intravenous contrast, however these  measurements appear to be minimally increased, approximately 4.4 x 4.1 cm in the tubular ascending thoracic aorta and 3.2 x 3.0 cm in the descending aorta. The sinuses of Valsalva and aortic root cannot be adequately evaluated on prior examination. 2.  Coronary artery disease.  Laboratory examination:    CMP Latest Ref Rng & Units 01/04/2019 10/04/2018 07/31/2013  Glucose 65 - 99 mg/dL 117(H) 109(H) 117(H)  BUN 8 - 27 mg/dL 18 16 13   Creatinine 0.76 - 1.27 mg/dL 0.83 0.94 0.73  Sodium 134 - 144 mmol/L 141 142 143  Potassium 3.5 - 5.2 mmol/L 4.8 4.8 4.3  Chloride 96 - 106 mmol/L 107(H) 103 103  CO2 20 - 29 mmol/L 21 27 28   Calcium 8.6 - 10.2 mg/dL 8.9 9.1 8.6  Total Protein 6.0 - 8.3 g/dL - - -  Total Bilirubin 0.3 - 1.2 mg/dL - - -  Alkaline Phos 39 - 117 U/L - - -  AST 0 - 37 U/L - - -  ALT 0 - 53 U/L - - -   CBC Latest Ref Rng & Units 07/31/2013 07/21/2011 07/20/2011  WBC 4.0 - 10.5 K/uL 6.2 9.3 15.1(H)  Hemoglobin 13.0 - 17.0 g/dL 13.4 10.1(L) 10.4(L)  Hematocrit 39.0 - 52.0 % 39.6 29.9(L) 31.7(L)  Platelets 150 - 400 K/uL 174 122(L) 121(L)   Lipid Panel  No results found for: CHOL, TRIG, HDL, CHOLHDL, VLDL, LDLCALC, LDLDIRECT HEMOGLOBIN A1C No results found for: HGBA1C, MPG TSH No results for input(s): TSH in the last 8760 hours.  Medications Discontinued During This Encounter  Medication Reason  . amLODipine (NORVASC) 5 MG tablet Change in therapy  . furosemide (LASIX) 40 MG tablet Patient Preference  . losartan (COZAAR) 100 MG tablet Patient Preference  . rosuvastatin (CRESTOR) 10 MG tablet Patient Preference  . Pregnenolone POWD Patient Preference  . tadalafil (CIALIS) 5 MG tablet Patient Preference   Current Meds  Medication Sig  . alfuzosin (UROXATRAL) 10 MG 24 hr tablet Take 10 mg by mouth daily.   Marland Kitchen amLODipine (NORVASC) 10 MG tablet Take 10 mg by mouth daily.  Marland Kitchen aspirin EC 81 MG tablet Take 81 mg by mouth daily.  . Cholecalciferol (VITAMIN D3) 125 MCG (  5000 UT)  TABS Take 1 tablet by mouth daily.  Marland Kitchen gabapentin (NEURONTIN) 100 MG capsule Take 100 mg by mouth 2 (two) times a day.  . Multiple Vitamin (MULTIVITAMIN) tablet Take 1 tablet by mouth daily.  Marland Kitchen OVER THE COUNTER MEDICATION Stool softener daily  . pantoprazole (PROTONIX) 20 MG tablet Take 20 mg by mouth daily.   Marland Kitchen spironolactone (ALDACTONE) 50 MG tablet TAKE 1 TABLET BY MOUTH EVERY DAY  . thiamine (VITAMIN B-1) 100 MG tablet Take 100 mg by mouth daily as needed.   . Zinc 50 MG TABS Take 1 tablet by mouth daily.    Cardiac Studies:  EKG 03/21/2019: Probable sinus rhythm at 60 bpm, normal axis, early R wave progression. Cannot exlcude RVH. No evidence of ischemia, normal QT interval. No change from EKG 09/20/2018.  Echocardiogram 08/31/2018:  Left ventricle cavity is normal in size. Moderate concentric hypertrophy of the left ventricle. Normal global wall motion. Calculated EF 55%. Left atrial cavity is severely dilated. Moderate (Grade II) mitral regurgitation. Moderate tricuspid regurgitation. Moderate pulmonary hypertension. Estimated pulmonary artery systolic pressure 50 mmHg. The aortic root is dilated measuring 4.5 cm. Compared to previous study on 08/31/2017, there is mild increase in aortic root diameter and severity of pulmonary hypertension.  US Carotid Bilateral Cone 05/01/2015: Normal. Negative for stenosis.   [place] Lower Ext Art Bilat 11/23/2014: 1. Elevated the bilateral ankle-brachial indices consistent with medial arterial sclerosis. This is often seen in the setting of chronic diabetes or end-stage renal disease. 2. Dedicated duplex sonography of the bilateral lower extremities demonstrates no evidence of clinically significant peripheral arterial disease. There is very mild scattered atherosclerotic plaque.  Sleep Study: Positive for Sleep Apnea; Uses CPAP  Assessment:     ICD-10-CM   1. Coronary artery calcification seen on CT scan  I25.10 EKG 12-Lead  2. Aortic dilatation  (HCC)  I77.819 CT ANGIO CHEST AORTA W/CM & OR WO/CM    losartan (COZAAR) 25 MG tablet  3. Essential hypertension  99991111 Basic metabolic panel    Magnesium    Basic Metabolic Panel (BMET)    losartan (COZAAR) 25 MG tablet  4. OSA on CPAP  G47.33    Z99.89   5. Former smoker  Z87.891   30. Class 3 severe obesity due to excess calories with serious comorbidity and body mass index (BMI) of 40.0 to 44.9 in adult (HCC)  E66.01    Z68.41   7. Pain in both lower extremities  M79.604 PCV ANKLE BRACHIAL INDEX (ABI)   M79.605    Recommendations:  Aortic dilatation:  Repeat CT angio chest aorta to reevaluate aortic dimensions as a part of a 1 year follow-up, June 2021.  Educated on the importance of blood pressure management.  Start losartan 12.5 mg p.o. every afternoon.  Benign essential hypertension:  Check BMP and magnesium level to evaluate kidney function and electrolytes given his medical therapy.  We will add losartan 12.5 mg p.o. every afternoon.  We will repeat BMP and magnesium level in 1 week after the initiation of ARB. Marland Kitchen Medication reconciled.  . Patient is asked to keep a log of both blood pressure and pulse so that medications can be titrated based on a blood pressure trend as opposed to isolated blood pressure readings in the office. . If the blood pressure is consistently greater than 173mmHg patient is asked to call the office to for medication titration sooner than the next office visit.  . Low salt diet recommended. A diet that  is rich in fruits, vegetables, legumes, and low-fat dairy products and low in snacks, sweets, and meats (such as the Dietary Approaches to Stop Hypertension [DASH] diet).   Pain in bilateral lower extremity:  Patient has underlying diagnosis of sciatica.  But given his cardiovascular risk factors as noted above peripheral artery disease cannot be ruled out.  He had prior evaluation for PAD in 2016.  His overall risk profile as progressed since then  and therefore I recommend a repeat ABI to reevaluate for peripheral vascular disease.  Continue aspirin.  Patient states that his most recent lipid profile was favorable and therefore statin medications were discontinued.  I do not have his most recent lipid profile for review and we did reach out to primary care provider for this to be faxed over.  Results pending.  Obesity, due to excess calories:  Body mass index is 41.18 kg/m.  I reviewed with the patient the importance of diet, regular physical activity/exercise, weight loss.    Patient is educated on increasing physical activity gradually as tolerated.  With the goal of moderate intensity exercise for 30 minutes a day 5 days a week.  Review of his electronic medical records notes a history of hyperlipidemia and diabetes. He states that both of them are well controlled and were recently checked by primary care provider. Will defer further management to primary team as I do not have any recent blood work for review at this time. But he is educated on aggressive lifestyle modifications giving his cardiovascular risk factors as described above.  Orders Placed This Encounter  Procedures  . CT ANGIO CHEST AORTA W/CM & OR WO/CM  . Basic metabolic panel  . Magnesium  . Basic Metabolic Panel (BMET)  . EKG 12-Lead  . PCV ANKLE BRACHIAL INDEX (ABI)   --Continue cardiac medications as reconciled in final medication list. --Return in about 3 months (around 12/20/2019) for Discussion of test results CT results . Or sooner if needed. --Continue follow-up with your primary care physician regarding the management of your other chronic comorbid conditions.  Patient's questions and concerns were addressed to his satisfaction. He voices understanding of the instructions provided during this encounter.   This note was created using a voice recognition software as a result there may be grammatical errors inadvertently enclosed that do not reflect the  nature of this encounter. Every attempt is made to correct such errors.  Rex Kras, DO, [facility]. [place] Office: [phone]

## 2019-09-21 DIAGNOSIS — I10 Essential (primary) hypertension: Secondary | ICD-10-CM | POA: Diagnosis not present

## 2019-09-22 LAB — BASIC METABOLIC PANEL
BUN/Creatinine Ratio: 13 (ref 10–24)
BUN: 14 mg/dL (ref 8–27)
CO2: 21 mmol/L (ref 20–29)
Calcium: 9.9 mg/dL (ref 8.6–10.2)
Chloride: 104 mmol/L (ref 96–106)
Creatinine, Ser: 1.04 mg/dL (ref 0.76–1.27)
GFR calc Af Amer: 81 mL/min/{1.73_m2} (ref >59)
GFR calc non Af Amer: 70 mL/min/{1.73_m2} (ref >59)
Glucose: 125 mg/dL — ABNORMAL HIGH (ref 65–99)
Potassium: 5.3 mmol/L — ABNORMAL HIGH (ref 3.5–5.2)
Sodium: 141 mmol/L (ref 134–144)

## 2019-09-22 LAB — MAGNESIUM: Magnesium: 2.1 mg/dL (ref 1.6–2.3)

## 2019-09-25 ENCOUNTER — Other Ambulatory Visit: Payer: Self-pay | Admitting: Cardiology

## 2019-09-25 DIAGNOSIS — M1612 Unilateral primary osteoarthritis, left hip: Secondary | ICD-10-CM | POA: Diagnosis not present

## 2019-09-25 DIAGNOSIS — M5388 Other specified dorsopathies, sacral and sacrococcygeal region: Secondary | ICD-10-CM | POA: Diagnosis not present

## 2019-09-25 DIAGNOSIS — M9905 Segmental and somatic dysfunction of pelvic region: Secondary | ICD-10-CM | POA: Diagnosis not present

## 2019-09-25 DIAGNOSIS — M48061 Spinal stenosis, lumbar region without neurogenic claudication: Secondary | ICD-10-CM | POA: Diagnosis not present

## 2019-09-25 DIAGNOSIS — M9903 Segmental and somatic dysfunction of lumbar region: Secondary | ICD-10-CM | POA: Diagnosis not present

## 2019-09-25 DIAGNOSIS — M9904 Segmental and somatic dysfunction of sacral region: Secondary | ICD-10-CM | POA: Diagnosis not present

## 2019-09-25 DIAGNOSIS — M25552 Pain in left hip: Secondary | ICD-10-CM | POA: Diagnosis not present

## 2019-09-25 DIAGNOSIS — M545 Low back pain: Secondary | ICD-10-CM | POA: Diagnosis not present

## 2019-09-25 DIAGNOSIS — M5116 Intervertebral disc disorders with radiculopathy, lumbar region: Secondary | ICD-10-CM | POA: Diagnosis not present

## 2019-09-25 DIAGNOSIS — I10 Essential (primary) hypertension: Secondary | ICD-10-CM

## 2019-09-25 DIAGNOSIS — M4316 Spondylolisthesis, lumbar region: Secondary | ICD-10-CM | POA: Diagnosis not present

## 2019-09-25 DIAGNOSIS — M5432 Sciatica, left side: Secondary | ICD-10-CM | POA: Diagnosis not present

## 2019-09-27 ENCOUNTER — Other Ambulatory Visit: Payer: Self-pay | Admitting: Cardiology

## 2019-09-27 DIAGNOSIS — I10 Essential (primary) hypertension: Secondary | ICD-10-CM | POA: Diagnosis not present

## 2019-09-28 LAB — BASIC METABOLIC PANEL
BUN/Creatinine Ratio: 14 (ref 10–24)
BUN: 12 mg/dL (ref 8–27)
CO2: 23 mmol/L (ref 20–29)
Calcium: 9.2 mg/dL (ref 8.6–10.2)
Chloride: 103 mmol/L (ref 96–106)
Creatinine, Ser: 0.88 mg/dL (ref 0.76–1.27)
GFR calc Af Amer: 97 mL/min/{1.73_m2} (ref >59)
GFR calc non Af Amer: 84 mL/min/{1.73_m2} (ref >59)
Glucose: 110 mg/dL — ABNORMAL HIGH (ref 65–99)
Potassium: 5.1 mmol/L (ref 3.5–5.2)
Sodium: 137 mmol/L (ref 134–144)

## 2019-09-29 ENCOUNTER — Ambulatory Visit: Payer: Medicare Other

## 2019-09-29 ENCOUNTER — Other Ambulatory Visit: Payer: Self-pay

## 2019-09-29 DIAGNOSIS — M79604 Pain in right leg: Secondary | ICD-10-CM | POA: Diagnosis not present

## 2019-09-29 DIAGNOSIS — M5432 Sciatica, left side: Secondary | ICD-10-CM | POA: Diagnosis not present

## 2019-09-29 DIAGNOSIS — M9905 Segmental and somatic dysfunction of pelvic region: Secondary | ICD-10-CM | POA: Diagnosis not present

## 2019-09-29 DIAGNOSIS — M9904 Segmental and somatic dysfunction of sacral region: Secondary | ICD-10-CM | POA: Diagnosis not present

## 2019-09-29 DIAGNOSIS — M9903 Segmental and somatic dysfunction of lumbar region: Secondary | ICD-10-CM | POA: Diagnosis not present

## 2019-09-29 DIAGNOSIS — M5388 Other specified dorsopathies, sacral and sacrococcygeal region: Secondary | ICD-10-CM | POA: Diagnosis not present

## 2019-09-29 DIAGNOSIS — M79605 Pain in left leg: Secondary | ICD-10-CM | POA: Diagnosis not present

## 2019-09-29 DIAGNOSIS — M4316 Spondylolisthesis, lumbar region: Secondary | ICD-10-CM | POA: Diagnosis not present

## 2019-10-03 DIAGNOSIS — M5388 Other specified dorsopathies, sacral and sacrococcygeal region: Secondary | ICD-10-CM | POA: Diagnosis not present

## 2019-10-03 DIAGNOSIS — M9903 Segmental and somatic dysfunction of lumbar region: Secondary | ICD-10-CM | POA: Diagnosis not present

## 2019-10-03 DIAGNOSIS — M4316 Spondylolisthesis, lumbar region: Secondary | ICD-10-CM | POA: Diagnosis not present

## 2019-10-03 DIAGNOSIS — M9905 Segmental and somatic dysfunction of pelvic region: Secondary | ICD-10-CM | POA: Diagnosis not present

## 2019-10-03 DIAGNOSIS — M5432 Sciatica, left side: Secondary | ICD-10-CM | POA: Diagnosis not present

## 2019-10-03 DIAGNOSIS — M9904 Segmental and somatic dysfunction of sacral region: Secondary | ICD-10-CM | POA: Diagnosis not present

## 2019-10-10 ENCOUNTER — Telehealth: Payer: Self-pay

## 2019-10-10 NOTE — Telephone Encounter (Signed)
Spoke with patient. Patient voiced understanding.

## 2019-10-10 NOTE — Telephone Encounter (Signed)
-----   Message from [name], [place] sent at 10/07/2019  2:24 PM EDT ----- ABIs are within normal limits, suggestive of normal perfusion bilaterally.

## 2019-10-11 DIAGNOSIS — M9904 Segmental and somatic dysfunction of sacral region: Secondary | ICD-10-CM | POA: Diagnosis not present

## 2019-10-11 DIAGNOSIS — M9903 Segmental and somatic dysfunction of lumbar region: Secondary | ICD-10-CM | POA: Diagnosis not present

## 2019-10-11 DIAGNOSIS — M5388 Other specified dorsopathies, sacral and sacrococcygeal region: Secondary | ICD-10-CM | POA: Diagnosis not present

## 2019-10-11 DIAGNOSIS — M9905 Segmental and somatic dysfunction of pelvic region: Secondary | ICD-10-CM | POA: Diagnosis not present

## 2019-10-11 DIAGNOSIS — M4726 Other spondylosis with radiculopathy, lumbar region: Secondary | ICD-10-CM | POA: Diagnosis not present

## 2019-10-11 DIAGNOSIS — M5432 Sciatica, left side: Secondary | ICD-10-CM | POA: Diagnosis not present

## 2019-10-16 DIAGNOSIS — M5432 Sciatica, left side: Secondary | ICD-10-CM | POA: Diagnosis not present

## 2019-10-16 DIAGNOSIS — M5388 Other specified dorsopathies, sacral and sacrococcygeal region: Secondary | ICD-10-CM | POA: Diagnosis not present

## 2019-10-16 DIAGNOSIS — M9905 Segmental and somatic dysfunction of pelvic region: Secondary | ICD-10-CM | POA: Diagnosis not present

## 2019-10-16 DIAGNOSIS — M9903 Segmental and somatic dysfunction of lumbar region: Secondary | ICD-10-CM | POA: Diagnosis not present

## 2019-10-16 DIAGNOSIS — M5116 Intervertebral disc disorders with radiculopathy, lumbar region: Secondary | ICD-10-CM | POA: Diagnosis not present

## 2019-10-16 DIAGNOSIS — M9904 Segmental and somatic dysfunction of sacral region: Secondary | ICD-10-CM | POA: Diagnosis not present

## 2019-10-20 DIAGNOSIS — R972 Elevated prostate specific antigen [PSA]: Secondary | ICD-10-CM | POA: Diagnosis not present

## 2019-10-23 DIAGNOSIS — M5432 Sciatica, left side: Secondary | ICD-10-CM | POA: Diagnosis not present

## 2019-10-23 DIAGNOSIS — M5388 Other specified dorsopathies, sacral and sacrococcygeal region: Secondary | ICD-10-CM | POA: Diagnosis not present

## 2019-10-23 DIAGNOSIS — M9903 Segmental and somatic dysfunction of lumbar region: Secondary | ICD-10-CM | POA: Diagnosis not present

## 2019-10-23 DIAGNOSIS — M5116 Intervertebral disc disorders with radiculopathy, lumbar region: Secondary | ICD-10-CM | POA: Diagnosis not present

## 2019-10-23 DIAGNOSIS — M9905 Segmental and somatic dysfunction of pelvic region: Secondary | ICD-10-CM | POA: Diagnosis not present

## 2019-10-23 DIAGNOSIS — M9904 Segmental and somatic dysfunction of sacral region: Secondary | ICD-10-CM | POA: Diagnosis not present

## 2019-10-27 DIAGNOSIS — M9904 Segmental and somatic dysfunction of sacral region: Secondary | ICD-10-CM | POA: Diagnosis not present

## 2019-10-27 DIAGNOSIS — M5116 Intervertebral disc disorders with radiculopathy, lumbar region: Secondary | ICD-10-CM | POA: Diagnosis not present

## 2019-10-27 DIAGNOSIS — N401 Enlarged prostate with lower urinary tract symptoms: Secondary | ICD-10-CM | POA: Diagnosis not present

## 2019-10-27 DIAGNOSIS — R350 Frequency of micturition: Secondary | ICD-10-CM | POA: Diagnosis not present

## 2019-10-27 DIAGNOSIS — N5201 Erectile dysfunction due to arterial insufficiency: Secondary | ICD-10-CM | POA: Diagnosis not present

## 2019-10-27 DIAGNOSIS — M9905 Segmental and somatic dysfunction of pelvic region: Secondary | ICD-10-CM | POA: Diagnosis not present

## 2019-10-27 DIAGNOSIS — M9903 Segmental and somatic dysfunction of lumbar region: Secondary | ICD-10-CM | POA: Diagnosis not present

## 2019-10-27 DIAGNOSIS — M5388 Other specified dorsopathies, sacral and sacrococcygeal region: Secondary | ICD-10-CM | POA: Diagnosis not present

## 2019-10-27 DIAGNOSIS — R972 Elevated prostate specific antigen [PSA]: Secondary | ICD-10-CM | POA: Diagnosis not present

## 2019-10-27 DIAGNOSIS — M5432 Sciatica, left side: Secondary | ICD-10-CM | POA: Diagnosis not present

## 2019-11-02 DIAGNOSIS — M9905 Segmental and somatic dysfunction of pelvic region: Secondary | ICD-10-CM | POA: Diagnosis not present

## 2019-11-02 DIAGNOSIS — M5432 Sciatica, left side: Secondary | ICD-10-CM | POA: Diagnosis not present

## 2019-11-02 DIAGNOSIS — M5388 Other specified dorsopathies, sacral and sacrococcygeal region: Secondary | ICD-10-CM | POA: Diagnosis not present

## 2019-11-02 DIAGNOSIS — M5116 Intervertebral disc disorders with radiculopathy, lumbar region: Secondary | ICD-10-CM | POA: Diagnosis not present

## 2019-11-02 DIAGNOSIS — M9903 Segmental and somatic dysfunction of lumbar region: Secondary | ICD-10-CM | POA: Diagnosis not present

## 2019-11-02 DIAGNOSIS — M9904 Segmental and somatic dysfunction of sacral region: Secondary | ICD-10-CM | POA: Diagnosis not present

## 2019-11-08 ENCOUNTER — Other Ambulatory Visit: Payer: Self-pay

## 2019-11-08 ENCOUNTER — Telehealth: Payer: Self-pay | Admitting: Diagnostic Neuroimaging

## 2019-11-08 ENCOUNTER — Encounter: Payer: Self-pay | Admitting: Diagnostic Neuroimaging

## 2019-11-08 ENCOUNTER — Ambulatory Visit (INDEPENDENT_AMBULATORY_CARE_PROVIDER_SITE_OTHER): Payer: Medicare Other | Admitting: Diagnostic Neuroimaging

## 2019-11-08 VITALS — BP 123/71 | HR 70 | Temp 97.5°F | Ht 72.0 in | Wt 295.8 lb

## 2019-11-08 DIAGNOSIS — M79605 Pain in left leg: Secondary | ICD-10-CM | POA: Diagnosis not present

## 2019-11-08 DIAGNOSIS — M48062 Spinal stenosis, lumbar region with neurogenic claudication: Secondary | ICD-10-CM

## 2019-11-08 DIAGNOSIS — I2510 Atherosclerotic heart disease of native coronary artery without angina pectoris: Secondary | ICD-10-CM | POA: Diagnosis not present

## 2019-11-08 NOTE — Telephone Encounter (Signed)
Medicare/bcbs supp order sent to [facility]. No auth they will reach out to the patient to schedule.  

## 2019-11-08 NOTE — Progress Notes (Signed)
[facility]  PATIENT: [name] [name] DOB: April 17, 1946  REFERRING CLINICIAN: Kohut HISTORY FROM: patient and wife  REASON FOR VISIT: follow up   HISTORICAL  CHIEF COMPLAINT:  Chief Complaint  Patient presents with  . Spinal Stenosis    rm 7, wife- [name],  [place] requested due to worsening symptoms in left hip/ leg of pain in daytime"    HISTORY OF PRESENT ILLNESS:   UPDATE (11/08/19, VRP): Since last visit, doing well until fall 2020, then worsening left leg pain, numbness, esp when waking up and starting activity. More left low back pain. Concerned about worsening back issues. Tried chiro tx without relief. Using TENS unit. OTC meds not helping. On gabapentin also.    NEW HPI (07/20/18, VRP): 74 year old male with right foot pain / numbness. Since last visit, doing about the same. Symptoms are about the same. Severity is mild-moderate. No alleviating or aggravating factors. Tolerating gabapentin now 100mg  twice a day --> started by PCP.  UPDATE 05/21/15: Since last visit, symptoms are stable. Test results reviewed. Still with intermittent numbness and twitching in right foot (sometimes left) esp with prolonged standing or walking.   PRIOR HPI (04/05/15): 74 year old right-handed male here for valuation of right foot numbness and twitching. March 2016 patient had onset of intermittent numbness and involuntary movement of the right foot lasting for 5-10 seconds at a time. He has had 6-10 attacks since March 2016. Symptoms typically affect him when he has been standing upright for a certain period of time. Movement bending walking do not seem to aggravate the symptoms. Symptoms do not affect him when he is sitting down. He recalls one particularly severe attack in June 2016 when he was shopping with his wife, had significant numbness in the right foot, was concerned about strokelike symptoms and went to the parking lot to sit his car. He also had significant anxiety with this  attack and patient is not sure of the contribution of anxiety versus the neurologic symptoms. Symptoms ultimately resolved. He called his PCP to discuss the symptoms. Patient did not go to the emergency room. Patient was referred to me for EMG study on 03/07/15. Study was slightly abnormal demonstrating evidence of right L5-S1 radiculopathies.    REVIEW OF SYSTEMS: Full 14 system review of systems performed and negative except: as per HPI.    ALLERGIES: No Known Allergies  HOME MEDICATIONS: Outpatient Medications Prior to Visit  Medication Sig Dispense Refill  . alfuzosin (UROXATRAL) 10 MG 24 hr tablet Take 10 mg by mouth daily.     Marland Kitchen amLODipine (NORVASC) 10 MG tablet Take 10 mg by mouth daily.    Marland Kitchen aspirin EC 81 MG tablet Take 81 mg by mouth daily.    . Cholecalciferol (VITAMIN D3) 125 MCG (5000 UT) TABS Take 1 tablet by mouth daily.    Marland Kitchen gabapentin (NEURONTIN) 100 MG capsule Take 100 mg by mouth 2 (two) times a day.    . losartan (COZAAR) 25 MG tablet Take 0.5 tablets (12.5 mg total) by mouth every evening. 90 tablet 3  . Multiple Vitamin (MULTIVITAMIN) tablet Take 1 tablet by mouth daily.    Marland Kitchen OVER THE COUNTER MEDICATION Stool softener daily    . pantoprazole (PROTONIX) 20 MG tablet Take 20 mg by mouth daily.     . rosuvastatin (CRESTOR) 10 MG tablet Take 10 mg by mouth daily.    Marland Kitchen spironolactone (ALDACTONE) 50 MG tablet TAKE 1 TABLET BY MOUTH EVERY DAY 90 tablet 1  .  thiamine (VITAMIN B-1) 100 MG tablet Take 100 mg by mouth daily as needed.     . Zinc 50 MG TABS Take 1 tablet by mouth daily.     No facility-administered medications prior to visit.    PAST MEDICAL HISTORY: Past Medical History:  Diagnosis Date  . Coronary artery disease   . Difficulty urinating    prostate problem  . GERD (gastroesophageal reflux disease)    HIATAL HERNIA REPAIRED -with lap band 2 years ago NO LONGER HAS GERD  . History of elevated glucose    IN THE PAST - NO PROBLEMS SINCE GASTRIC BANDING  WEIGHT LOSS   . Hyperlipidemia   . Hypertension    resolved with lap band 2 years ago  . Sleep apnea    uses cpap setting is 12    PAST SURGICAL HISTORY: Past Surgical History:  Procedure Laterality Date  . COLONOSCOPY  07/17/11  . CYSTOSCOPY W/ URETERAL STENT PLACEMENT  07/18/2011   Procedure: CYSTOSCOPY WITH STENT REPLACEMENT;  Surgeon: Alexis Frock;  Location: [facility];  Service: Urology;  Laterality: Left;  Marland Kitchen GASTRIC RESTRICTION SURGERY  12/11/2008   lap band  . HEMORRHOID SURGERY N/A 08/04/2013   Procedure: EUA,HEMORRHOIDECTOMY;  Surgeon: [name], MD;  Location: [facility];  Service: General;  Laterality: N/A;  . LAPAROSCOPIC GASTRIC BANDING  12/11/2008  . LITHOTRIPSY      FAMILY HISTORY: Family History  Problem Relation Age of Onset  . Cancer Mother        ovarian  . Diabetes Mother   . Dementia Mother   . Other Father        MVA  . Healthy Brother     SOCIAL HISTORY:  Social History   Socioeconomic History  . Marital status: Married    Spouse name: [name]  . Number of children: 2  . Years of education: 2  . Highest education level: Not on file  Occupational History    Comment: retired, natural gas pipe line  Tobacco Use  . Smoking status: Former Smoker    Packs/day: 2.00    Years: 8.00    Pack years: 16.00    Types: Cigarettes    Quit date: 04/05/1971    Years since quitting: 48.6  . Smokeless tobacco: Former Network engineer and Sexual Activity  . Alcohol use: Yes    Alcohol/week: 0.0 standard drinks    Comment: occas wine  . Drug use: No  . Sexual activity: Not on file  Other Topics Concern  . Not on file  Social History Narrative   Married, lives at home with wife, [name].  Children 2.  Education 12th grade.  Reitired.     Caffeine use- coffee 1 cups daily   Social Determinants of Health   Financial Resource Strain:   . Difficulty of Paying Living Expenses:   Food Insecurity:   . Worried About Charity fundraiser in the Last Year:   .  Arboriculturist in the Last Year:   Transportation Needs:   . Film/video editor (Medical):   Marland Kitchen Lack of Transportation (Non-Medical):   Physical Activity:   . Days of Exercise per Week:   . Minutes of Exercise per Session:   Stress:   . Feeling of Stress :   Social Connections:   . Frequency of Communication with Friends and Family:   . Frequency of Social Gatherings with Friends and Family:   . Attends Religious Services:   . Active Member  of Clubs or Organizations:   . Attends Archivist Meetings:   Marland Kitchen Marital Status:   Intimate Partner Violence:   . Fear of Current or Ex-Partner:   . Emotionally Abused:   Marland Kitchen Physically Abused:   . Sexually Abused:      PHYSICAL EXAM  GENERAL EXAM/CONSTITUTIONAL: Vitals:  Vitals:   11/08/19 1303  BP: 123/71  Pulse: 70  Temp: (!) 97.5 F (36.4 C)  Weight: 295 lb 12.8 oz (134.2 kg)  Height: 6' (1.829 m)   Body mass index is 40.12 kg/m.  Wt Readings from Last 3 Encounters:  11/08/19 295 lb 12.8 oz (134.2 kg)  09/19/19 (!) 303 lb 9.6 oz (137.7 kg)  03/21/19 289 lb 6.4 oz (131.3 kg)    No exam data present  Patient is in no distress; well developed, nourished and groomed; neck is supple   CARDIOVASCULAR:  Examination of carotid arteries is normal; no carotid bruits  Regular rate and rhythm, no murmurs  Examination of peripheral vascular system by observation and palpation is normal  EYES:  Ophthalmoscopic exam of optic discs and posterior segments is normal; no papilledema or hemorrhages  MUSCULOSKELETAL:  Gait, strength, tone, movements noted in Neurologic exam below  NEUROLOGIC: MENTAL STATUS:  No flowsheet data found.  awake, alert, oriented to person, place and time  recent and remote memory intact  normal attention and concentration  language fluent, comprehension intact, naming intact,   fund of knowledge appropriate  CRANIAL NERVE:   2nd - no papilledema on fundoscopic exam  2nd, 3rd,  4th, 6th - pupils equal and reactive to light, visual fields full to confrontation, extraocular muscles intact, no nystagmus  5th - facial sensation symmetric  7th - facial strength symmetric  8th - hearing intact  9th - palate elevates symmetrically, uvula midline  11th - shoulder shrug symmetric  12th - tongue protrusion midline  MOTOR:   normal bulk and tone, full strength in the BUE, BLE  SENSORY:   normal and symmetric to light touch, temperature, vibration; EXCEPT DEC IN LEFT FOOT   COORDINATION:   finger-nose-finger, fine finger movements normal  REFLEXES:   deep tendon reflexes TRACE and symmetric  GAIT/STATION:   narrow based gait; romberg is negative    DIAGNOSTIC DATA (LABS, IMAGING, TESTING) - I reviewed patient records, labs, notes, testing and imaging myself where available.  Lab Results  Component Value Date   WBC 6.2 07/31/2013   HGB 13.4 07/31/2013   HCT 39.6 07/31/2013   MCV 93.8 07/31/2013   PLT 174 07/31/2013      Component Value Date/Time   NA 137 09/27/2019 1354   K 5.1 09/27/2019 1354   CL 103 09/27/2019 1354   CO2 23 09/27/2019 1354   GLUCOSE 110 (H) 09/27/2019 1354   GLUCOSE 117 (H) 07/31/2013 0845   BUN 12 09/27/2019 1354   CREATININE 0.88 09/27/2019 1354   CALCIUM 9.2 09/27/2019 1354   PROT 7.5 12/06/2008 1006   ALBUMIN 4.2 12/06/2008 1006   AST 23 12/06/2008 1006   ALT 26 12/06/2008 1006   ALKPHOS 40 12/06/2008 1006   BILITOT 0.6 12/06/2008 1006   GFRNONAA 84 09/27/2019 1354   GFRAA 97 09/27/2019 1354   No results found for: CHOL, HDL, LDLCALC, LDLDIRECT, TRIG, CHOLHDL No results found for: HGBA1C No results found for: VITAMINB12 No results found for: TSH   03/08/15 EMG/NCS 1. Electrodiagnostic evidence of right L5, S1 radiculopathies. 2. Bilateral lower lumbar root irritation noted on paraspinal needle EMG.  05/10/15 MRI lumbar spine [I reviewed images myself and agree with interpretation. At L4-5 the leftward disc  herniation is likely asymptomatic. However I think he is symptomatic from spinal stenosis at L3-4. -VRP]  1. At L4-L5, there is a large left disc herniation that feels the lateral recess and severe loss of disc height, endplate spurring, moderate facet hypertrophy. There is compression of the left L5 nerve root there is distorsion of the left S1 nerve root. Additionally, there is some lesser encroachment upon the exiting L4 and the right L5 nerve root. 2. At L3-L4, there is moderate spinal stenosis, transverse more than AP, and severe facet hypertrophy causing mild anterolisthesis. There is no definite nerve root compression though there is some encroachment upon the left L3 and left L4 nerve roots 3.Moderate degenerative changes at the other lumbar levels do not lead to encroachment or compression of the nerve roots. 4.The L5 vertebral body appears to be sacralized. This can be confirmed on plain x-rays.  05/01/15 carotid u/s  - no stenosis  04/29/15 MRI brain (without) demonstrating: 1. Scattered periventricular and subcortical chronic small vessel ischemic disease. 2. No acute findings.  04/29/15 EEG - normal    ASSESSMENT AND PLAN  74 y.o. year old male here with intermittent right foot numbness and tremor, particularly with standing position, most likely related to lumbar radiculopathy and spinal stenosis.    Dx:  Spinal stenosis of lumbar region with neurogenic claudication - Plan: MR LUMBAR SPINE WO CONTRAST  Left leg pain - Plan: MR LUMBAR SPINE WO CONTRAST    PLAN:  LEFT LEG PAIN / NUMBNESS --> concern for worsening disc herniation on the left side and spinal stenosis  - continue gabapentin 200mg  twice a day  - check MRI lumbar spine (failed conservative mgmt > 6 weeks); then consider neurosurgery/spine or interventional pain evaluation   Orders Placed This Encounter  Procedures  . MR LUMBAR SPINE WO CONTRAST   Return pending test results, for pending if symptoms  worsen or fail to improve.    Penni Bombard, MD [id number], [id number] PM Certified in Neurology, Neurophysiology and Neuroimaging  [facility] [address] [place], [place] [address] [phone]

## 2019-11-08 NOTE — Patient Instructions (Signed)
-   check MRI lumbar spine 

## 2019-11-10 DIAGNOSIS — M5116 Intervertebral disc disorders with radiculopathy, lumbar region: Secondary | ICD-10-CM | POA: Diagnosis not present

## 2019-11-10 DIAGNOSIS — M9904 Segmental and somatic dysfunction of sacral region: Secondary | ICD-10-CM | POA: Diagnosis not present

## 2019-11-10 DIAGNOSIS — M5432 Sciatica, left side: Secondary | ICD-10-CM | POA: Diagnosis not present

## 2019-11-10 DIAGNOSIS — M9903 Segmental and somatic dysfunction of lumbar region: Secondary | ICD-10-CM | POA: Diagnosis not present

## 2019-11-10 DIAGNOSIS — M5388 Other specified dorsopathies, sacral and sacrococcygeal region: Secondary | ICD-10-CM | POA: Diagnosis not present

## 2019-11-10 DIAGNOSIS — M9905 Segmental and somatic dysfunction of pelvic region: Secondary | ICD-10-CM | POA: Diagnosis not present

## 2019-11-17 DIAGNOSIS — M9904 Segmental and somatic dysfunction of sacral region: Secondary | ICD-10-CM | POA: Diagnosis not present

## 2019-11-17 DIAGNOSIS — M9903 Segmental and somatic dysfunction of lumbar region: Secondary | ICD-10-CM | POA: Diagnosis not present

## 2019-11-17 DIAGNOSIS — M9905 Segmental and somatic dysfunction of pelvic region: Secondary | ICD-10-CM | POA: Diagnosis not present

## 2019-11-17 DIAGNOSIS — M5388 Other specified dorsopathies, sacral and sacrococcygeal region: Secondary | ICD-10-CM | POA: Diagnosis not present

## 2019-11-17 DIAGNOSIS — M5116 Intervertebral disc disorders with radiculopathy, lumbar region: Secondary | ICD-10-CM | POA: Diagnosis not present

## 2019-11-17 DIAGNOSIS — M5432 Sciatica, left side: Secondary | ICD-10-CM | POA: Diagnosis not present

## 2019-11-19 ENCOUNTER — Ambulatory Visit
Admission: RE | Admit: 2019-11-19 | Discharge: 2019-11-19 | Disposition: A | Discharge status: Home / Self Care | Payer: Medicare Other | Source: Ambulatory Visit | Attending: Diagnostic Neuroimaging | Admitting: Diagnostic Neuroimaging

## 2019-11-19 ENCOUNTER — Other Ambulatory Visit: Payer: Self-pay

## 2019-11-19 DIAGNOSIS — M79605 Pain in left leg: Secondary | ICD-10-CM

## 2019-11-19 DIAGNOSIS — M545 Low back pain: Secondary | ICD-10-CM | POA: Diagnosis not present

## 2019-11-19 DIAGNOSIS — M48062 Spinal stenosis, lumbar region with neurogenic claudication: Secondary | ICD-10-CM

## 2019-11-27 ENCOUNTER — Telehealth: Payer: Self-pay | Admitting: Diagnostic Neuroimaging

## 2019-11-27 DIAGNOSIS — M48062 Spinal stenosis, lumbar region with neurogenic claudication: Secondary | ICD-10-CM

## 2019-11-27 DIAGNOSIS — N12 Tubulo-interstitial nephritis, not specified as acute or chronic: Secondary | ICD-10-CM

## 2019-11-27 NOTE — Telephone Encounter (Signed)
Ohman,[name] [name](wife) is asking for a call from RN to discuss when pt should come back since the results to the MRI are available.

## 2019-11-27 NOTE — Telephone Encounter (Signed)
Orders Placed This Encounter  Procedures  . Ambulatory referral to Neurosurgery    Penni Bombard, MD [id number], [id number] PM Certified in Neurology, Neurophysiology and Neuroimaging  [facility] [address] [place], [place] [address] [phone]

## 2019-11-27 NOTE — Addendum Note (Signed)
Addended by: Andrey Spearman R on: 11/27/2019 05:23 PM   Modules accepted: Orders

## 2019-11-27 NOTE — Telephone Encounter (Signed)
LVM requesting patient to call back tomorrow for MRI results, next steps.

## 2019-11-28 NOTE — Telephone Encounter (Signed)
LVM #2 requesting call back for MRI results. 

## 2019-11-28 NOTE — Telephone Encounter (Signed)
Received call back from patient and wife on speaker. Informed them his MRI lumbar showed lumbar spinal stenosis at L4-5 and L5-S1. Dr Leta Baptist has put in referal to neurosurgery. I advised them if he doesn't get a call in a week from now to call back and check on referral. Patient verbalized understanding, appreciation.

## 2019-12-07 DIAGNOSIS — M48062 Spinal stenosis, lumbar region with neurogenic claudication: Secondary | ICD-10-CM | POA: Diagnosis not present

## 2019-12-20 ENCOUNTER — Ambulatory Visit: Payer: Medicare Other | Admitting: Cardiology

## 2020-01-15 ENCOUNTER — Other Ambulatory Visit: Payer: Self-pay

## 2020-01-15 ENCOUNTER — Ambulatory Visit: Payer: Medicare Other | Admitting: Cardiology

## 2020-01-15 ENCOUNTER — Encounter: Payer: Self-pay | Admitting: Cardiology

## 2020-01-15 VITALS — BP 141/82 | HR 86 | Ht 72.0 in | Wt 292.0 lb

## 2020-01-15 DIAGNOSIS — I2510 Atherosclerotic heart disease of native coronary artery without angina pectoris: Secondary | ICD-10-CM

## 2020-01-15 DIAGNOSIS — I77819 Aortic ectasia, unspecified site: Secondary | ICD-10-CM

## 2020-01-15 DIAGNOSIS — G4733 Obstructive sleep apnea (adult) (pediatric): Secondary | ICD-10-CM

## 2020-01-15 DIAGNOSIS — I10 Essential (primary) hypertension: Secondary | ICD-10-CM

## 2020-01-15 DIAGNOSIS — Z87891 Personal history of nicotine dependence: Secondary | ICD-10-CM

## 2020-01-15 MED ORDER — LOSARTAN POTASSIUM 25 MG PO TABS: 25.0000 mg | ORAL_TABLET | Freq: Every evening | ORAL | 0 refills | Status: DC

## 2020-01-15 NOTE — Patient Instructions (Signed)
Increase Losartan to 25mg  po every evening.  Labs in one week after increasing Losartan.  Needs to schedule CT chest prior to next office visit.

## 2020-01-15 NOTE — Progress Notes (Signed)
Primary Physician:  Deland Pretty, MD   Patient ID: [name] [name], male    DOB: Nov 15, 1945, 74 y.o.   MRN: [id number]  Subjective:    Chief Complaint  Patient presents with  . coronary calcification    test results  . Follow-up  . Hypertension    HPI: [name] [name]  is a 74 y.o. male  with hypertension, lumbar spinal stenosis, obstructive sleep apnea on CPAP, aortic root dilation, advanced age, coronary artery calcification, former smoker, and obesity due to excess calories s/p gastric banding surgery in 2010 presents to the office with a chief complaint of " follow up for hypertension and coronary artery calcification.  Patient is unaccompanied at today's office visit.  Since last office visit patient has undergone back surgery but no hospitalizations for cardiovascular symptoms.    Coronary artery calcification: Patient was noted to have coronary artery calcification and therefore underwent a stress test.  Results noted below for further reference.  No symptoms of chest pain at rest or with effort related activity.  He does have chronic effort related dyspnea which is stable.  Medications reconciled.  Continue aspirin and statin therapy.  Dilated aorta: On a surface echocardiogram he was found to have an aortic root of 4.3 cm and on a subsequent echocardiogram in February 2020 the aortic root was noted to be 4.5 cm.  He underwent CTA of the chest in June 2020 and the findings are noted below for further reference.  Had a detailed discussion in regards to the importance of blood pressure management to help prevent and/or delay the progression of aortic dilatation.  Patient brought in a log of his blood pressures that were recorded sporadically on various days at various times and his systolic blood pressure readings could range anywhere from 120 to 150 mmHg.  Patient states that he is compliant with his antihypertensive medications.  Patient was supposed to have a repeat CTA chest to  reevaluate the aortic dimensions prior to today's office visit.  However, he forgot to do so.  Past Medical History:  Diagnosis Date  . Coronary artery disease   . Difficulty urinating    prostate problem  . GERD (gastroesophageal reflux disease)    HIATAL HERNIA REPAIRED -with lap band 2 years ago NO LONGER HAS GERD  . History of elevated glucose    IN THE PAST - NO PROBLEMS SINCE GASTRIC BANDING WEIGHT LOSS   . Hyperlipidemia   . Hypertension    resolved with lap band 2 years ago  . Sleep apnea    uses cpap setting is 12    Past Surgical History:  Procedure Laterality Date  . COLONOSCOPY  07/17/11  . CYSTOSCOPY W/ URETERAL STENT PLACEMENT  07/18/2011   Procedure: CYSTOSCOPY WITH STENT REPLACEMENT;  Surgeon: Alexis Frock;  Location: [facility];  Service: Urology;  Laterality: Left;  Marland Kitchen GASTRIC RESTRICTION SURGERY  12/11/2008   lap band  . HEMORRHOID SURGERY N/A 08/04/2013   Procedure: EUA,HEMORRHOIDECTOMY;  Surgeon: [name], MD;  Location: [facility];  Service: General;  Laterality: N/A;  . LAPAROSCOPIC GASTRIC BANDING  12/11/2008  . LITHOTRIPSY      Social History   Tobacco Use  . Smoking status: Former Smoker    Packs/day: 2.00    Years: 8.00    Pack years: 16.00    Types: Cigarettes    Quit date: 04/05/1971    Years since quitting: 48.8  . Smokeless tobacco: Former Network engineer  . Vaping  Use: Never used  Substance Use Topics  . Alcohol use: Yes    Alcohol/week: 0.0 standard drinks    Comment: occas wine  . Drug use: No     Review of Systems  Constitutional: Negative for decreased appetite, malaise/fatigue, weight gain and weight loss.  Eyes: Negative for visual disturbance.  Cardiovascular: Positive for dyspnea on exertion (improved). Negative for chest pain, claudication, leg swelling (end of the day; improved with support stockings), orthopnea, palpitations and syncope.  Respiratory: Negative for hemoptysis and wheezing.   Endocrine: Negative for cold  intolerance and heat intolerance.  Hematologic/Lymphatic: Does not bruise/bleed easily.  Skin: Negative for nail changes.  Musculoskeletal: Negative for muscle weakness and myalgias.  Gastrointestinal: Negative for abdominal pain, change in bowel habit, nausea and vomiting.  Neurological: Negative for difficulty with concentration, dizziness, focal weakness and headaches.  Psychiatric/Behavioral: Negative for altered mental status and suicidal ideas.  All other systems reviewed and are negative.     Objective:  Blood pressure (!) 141/82, pulse 86, height 6' (1.829 m), weight 292 lb (132.5 kg), SpO2 96 %. Body mass index is 39.6 kg/m.    Physical Exam Vitals reviewed.  Constitutional:      General: He is not in acute distress.    Appearance: He is well-developed.  HENT:     Head: Normocephalic and atraumatic.  Cardiovascular:     Rate and Rhythm: Normal rate and regular rhythm.     Pulses: Intact distal pulses.          Femoral pulses are 2+ on the right side and 2+ on the left side.      Popliteal pulses are 2+ on the right side and 2+ on the left side.       Dorsalis pedis pulses are 1+ on the right side and 1+ on the left side.       Posterior tibial pulses are 2+ on the right side and 2+ on the left side.     Heart sounds: Normal heart sounds.  Pulmonary:     Effort: Pulmonary effort is normal. No accessory muscle usage or respiratory distress.     Breath sounds: Normal breath sounds.  Abdominal:     General: Bowel sounds are normal.     Palpations: Abdomen is soft.  Musculoskeletal:        General: Normal range of motion.     Cervical back: Normal range of motion.  Skin:    General: Skin is warm and dry.  Neurological:     Mental Status: He is alert and oriented to person, place, and time.  Psychiatric:        Behavior: Behavior normal.    Radiology:  CTA of chest 12/07/2018:  1. The tubular ascending thoracic aorta is enlarged, measuring 4.5 x  4.4 cm. The aortic  valve measures 2.6 cm. The sinuses of Valsalva measure 4.7 cm. The descending thoracic aorta is slightly enlarged measuring 3.2 x 3.2 cm. Prior examination dated 2009 is significantly limited by motion artifact and lack of intravenous contrast, however these measurements appear to be minimally increased, approximately 4.4 x 4.1 cm in the tubular ascending thoracic aorta and 3.2 x 3.0 cm in the descending aorta. The sinuses of Valsalva and aortic root cannot be adequately evaluated on prior examination. 2.  Coronary artery disease.  Laboratory examination:   External Labs: Collected: 08/2019 Creatinine 1.0 mg/dL. eGFR: 77 mL/min per 1.73 m Potassium 4.9 Lipid profile: Total cholesterol 103, triglycerides 100, HDL 33 LDL 50,  non-HDL 70 Hemoglobin A1c: 6.0  Medications Discontinued During This Encounter  Medication Reason  . thiamine (VITAMIN B-1) 100 MG tablet Completed Course  . losartan (COZAAR) 25 MG tablet Dose change   Current Meds  Medication Sig  . alfuzosin (UROXATRAL) 10 MG 24 hr tablet Take 10 mg by mouth daily.   Marland Kitchen amLODipine (NORVASC) 10 MG tablet Take 10 mg by mouth daily.  Marland Kitchen aspirin EC 81 MG tablet Take 81 mg by mouth daily.  . Cholecalciferol (VITAMIN D3) 125 MCG (5000 UT) TABS Take 1 tablet by mouth daily.  Marland Kitchen gabapentin (NEURONTIN) 100 MG capsule Take 100 mg by mouth 2 (two) times a day.  . Multiple Vitamin (MULTIVITAMIN) tablet Take 1 tablet by mouth daily.  Marland Kitchen OVER THE COUNTER MEDICATION Stool softener daily  . pantoprazole (PROTONIX) 20 MG tablet Take 20 mg by mouth daily.   . rosuvastatin (CRESTOR) 10 MG tablet Take 10 mg by mouth daily.  Marland Kitchen spironolactone (ALDACTONE) 50 MG tablet TAKE 1 TABLET BY MOUTH EVERY DAY  . Zinc 50 MG TABS Take 1 tablet by mouth daily.  . [DISCONTINUED] losartan (COZAAR) 25 MG tablet Take 0.5 tablets (12.5 mg total) by mouth every evening.    Cardiac Studies:  EKG 03/21/2019: Probable sinus rhythm at 60 bpm, normal axis, early R wave  progression. Cannot exlcude RVH. No evidence of ischemia, normal QT interval. No change from EKG 09/20/2018.  Echocardiogram 08/31/2018:  Left ventricle cavity is normal in size. Moderate concentric hypertrophy of the left ventricle. Normal global wall motion. Calculated EF 55%. Left atrial cavity is severely dilated. Moderate (Grade II) mitral regurgitation. Moderate tricuspid regurgitation. Moderate pulmonary hypertension. Estimated pulmonary artery systolic pressure 50 mmHg. The aortic root is dilated measuring 4.5 cm. Compared to previous study on 08/31/2017, there is mild increase in aortic root diameter and severity of pulmonary hypertension.  Exercise myoview stress test  05/01/2019: Patient exercised on Bruce protocol for 5:00  minutes and achieved  99 % of Max Predicted HR (Target HR was >85% MPHR) and  6.79 METS. Stress symptoms included fatigue and dyspnea. Normal BP response.  Exercise capacity was low. The perfusion imaging study demonstrates mild diaphragmatic attenuation artifact and normal perfusion in all vascular territories. Dynamic gated images reveal normal endocardial thickening and left ventricular systolic function. LVEF calculated was   45% but visually appeared to be normal. This is a low risk study.  US Carotid Bilateral Cone 05/01/2015: Normal. Negative for stenosis.   ABI 09/29/2019: This exam reveals normal perfusion of the right lower extremity (ABI 1.19 ) and normal perfusion of the left lower extremity (ABI 1.26).   Sleep Study: Positive for Sleep Apnea; Uses CPAP  Assessment:     ICD-10-CM   1. Aortic dilatation (HCC)  I77.819 losartan (COZAAR) 25 MG tablet    Magnesium    Basic metabolic panel  2. Essential hypertension  I10 losartan (COZAAR) 25 MG tablet    Magnesium    Basic metabolic panel  3. Coronary artery calcification seen on CT scan  I25.10   4. OSA on CPAP  G47.33    Z99.89   5. Former smoker  Z87.891   52. Class 2 severe obesity due to  excess calories with serious comorbidity and body mass index (BMI) of 39.0 to 39.9 in adult [facility])  E66.01    Z68.39    Recommendations:  Aortic dilatation:  Repeat CT angio chest aorta to reevaluate aortic dimensions as a part of a 1 year follow-up, June  2021.  Patient forgot to have this performed prior to today's office visit.  He is asked to have this rescheduled.  Educated on the importance of blood pressure management.  Increase losartan to 25 mg p.o. every afternoon.  Blood work in 1 week to evaluate kidney function and electrolytes.  Benign essential hypertension: . Office blood pressure is currently not at goal given his aortic dilatation. . Patient will be enrolled into principal care management for ambulatory blood pressure monitoring. . For now we will increase losartan to 25 mg p.o. every afternoon as noted above. . Medication reconciled.  . Low salt diet recommended. A diet that is rich in fruits, vegetables, legumes, and low-fat dairy products and low in snacks, sweets, and meats (such as the Dietary Approaches to Stop Hypertension [DASH] diet).   Obesity, due to excess calories: Body mass index is 39.6 kg/m.  I reviewed with the patient the importance of diet, regular physical activity/exercise, weight loss.    Patient is educated on increasing physical activity gradually as tolerated.  With the goal of moderate intensity exercise for 30 minutes a day 5 days a week.  Patient did have an ankle-brachial index performed at his last office visit.  The test was performed due to pain in the lower extremities.  The ABIs are within normal limits and the results were reviewed with him again in great detail and questions and concerns addressed.  Orders Placed This Encounter  Procedures  . Magnesium  . Basic metabolic panel   --Continue cardiac medications as reconciled in final medication list. --Return in about 8 weeks (around 03/11/2020) for BP follow up and review CT results.  . Or sooner if needed. --Continue follow-up with your primary care physician regarding the management of your other chronic comorbid conditions.  Patient's questions and concerns were addressed to his satisfaction. He voices understanding of the instructions provided during this encounter.   This note was created using a voice recognition software as a result there may be grammatical errors inadvertently enclosed that do not reflect the nature of this encounter. Every attempt is made to correct such errors.  Rex Kras, [place], [facility]  Pager: [phone] Office: [phone]

## 2020-01-16 ENCOUNTER — Other Ambulatory Visit: Payer: Self-pay

## 2020-01-16 DIAGNOSIS — I77819 Aortic ectasia, unspecified site: Secondary | ICD-10-CM

## 2020-01-16 DIAGNOSIS — I10 Essential (primary) hypertension: Secondary | ICD-10-CM

## 2020-01-19 ENCOUNTER — Ambulatory Visit: Payer: Medicare Other | Admitting: Cardiology

## 2020-01-22 ENCOUNTER — Other Ambulatory Visit: Payer: Self-pay | Admitting: Cardiology

## 2020-01-22 DIAGNOSIS — I10 Essential (primary) hypertension: Secondary | ICD-10-CM | POA: Diagnosis not present

## 2020-01-23 ENCOUNTER — Telehealth: Payer: Self-pay

## 2020-01-23 LAB — BASIC METABOLIC PANEL
BUN/Creatinine Ratio: 14 (ref 10–24)
BUN: 11 mg/dL (ref 8–27)
CO2: 24 mmol/L (ref 20–29)
Calcium: 9.1 mg/dL (ref 8.6–10.2)
Chloride: 100 mmol/L (ref 96–106)
Creatinine, Ser: 0.76 mg/dL (ref 0.76–1.27)
GFR calc Af Amer: 103 mL/min/{1.73_m2} (ref >59)
GFR calc non Af Amer: 89 mL/min/{1.73_m2} (ref >59)
Glucose: 108 mg/dL — ABNORMAL HIGH (ref 65–99)
Potassium: 5 mmol/L (ref 3.5–5.2)
Sodium: 135 mmol/L (ref 134–144)

## 2020-01-23 LAB — MAGNESIUM: Magnesium: 1.9 mg/dL (ref 1.6–2.3)

## 2020-01-23 NOTE — Telephone Encounter (Signed)
-----   Message from [place], [place] sent at 01/23/2020  3:41 PM EDT ----- Results reviewed.Serum potassium and magnesium levels are within normal limits.Kidney function is relatively at baseline.Continue current medical therapy.Call if questions arise.

## 2020-01-23 NOTE — Telephone Encounter (Signed)
Relayed information to patient, patient voiced understanding.  

## 2020-01-29 ENCOUNTER — Ambulatory Visit
Admission: RE | Admit: 2020-01-29 | Discharge: 2020-01-29 | Disposition: A | Discharge status: Home / Self Care | Payer: Medicare Other | Source: Ambulatory Visit | Attending: Cardiology | Admitting: Cardiology

## 2020-01-29 DIAGNOSIS — I2510 Atherosclerotic heart disease of native coronary artery without angina pectoris: Secondary | ICD-10-CM | POA: Diagnosis not present

## 2020-01-29 DIAGNOSIS — I77819 Aortic ectasia, unspecified site: Secondary | ICD-10-CM

## 2020-01-29 DIAGNOSIS — I700 Atherosclerosis of aorta: Secondary | ICD-10-CM | POA: Diagnosis not present

## 2020-01-29 DIAGNOSIS — I712 Thoracic aortic aneurysm, without rupture: Secondary | ICD-10-CM | POA: Diagnosis not present

## 2020-01-29 DIAGNOSIS — J8410 Pulmonary fibrosis, unspecified: Secondary | ICD-10-CM | POA: Diagnosis not present

## 2020-01-29 MED ORDER — IOPAMIDOL (ISOVUE-370) INJECTION 76%
75.0000 mL | Freq: Once | INTRAVENOUS | Status: AC | PRN
  Administered 2020-01-29: 75 mL via INTRAVENOUS

## 2020-02-03 DIAGNOSIS — I10 Essential (primary) hypertension: Secondary | ICD-10-CM | POA: Diagnosis not present

## 2020-02-13 ENCOUNTER — Other Ambulatory Visit: Payer: Self-pay | Admitting: Cardiology

## 2020-02-13 DIAGNOSIS — E118 Type 2 diabetes mellitus with unspecified complications: Secondary | ICD-10-CM | POA: Diagnosis not present

## 2020-02-13 DIAGNOSIS — I77819 Aortic ectasia, unspecified site: Secondary | ICD-10-CM

## 2020-02-13 DIAGNOSIS — Z125 Encounter for screening for malignant neoplasm of prostate: Secondary | ICD-10-CM | POA: Diagnosis not present

## 2020-02-13 DIAGNOSIS — I10 Essential (primary) hypertension: Secondary | ICD-10-CM | POA: Diagnosis not present

## 2020-02-20 DIAGNOSIS — E669 Obesity, unspecified: Secondary | ICD-10-CM | POA: Diagnosis not present

## 2020-02-20 DIAGNOSIS — Z0000 Encounter for general adult medical examination without abnormal findings: Secondary | ICD-10-CM | POA: Diagnosis not present

## 2020-02-20 DIAGNOSIS — E118 Type 2 diabetes mellitus with unspecified complications: Secondary | ICD-10-CM | POA: Diagnosis not present

## 2020-02-20 DIAGNOSIS — I10 Essential (primary) hypertension: Secondary | ICD-10-CM | POA: Diagnosis not present

## 2020-02-20 DIAGNOSIS — Z9989 Dependence on other enabling machines and devices: Secondary | ICD-10-CM | POA: Diagnosis not present

## 2020-02-20 DIAGNOSIS — G629 Polyneuropathy, unspecified: Secondary | ICD-10-CM | POA: Diagnosis not present

## 2020-02-20 DIAGNOSIS — G4733 Obstructive sleep apnea (adult) (pediatric): Secondary | ICD-10-CM | POA: Diagnosis not present

## 2020-02-20 DIAGNOSIS — E7800 Pure hypercholesterolemia, unspecified: Secondary | ICD-10-CM | POA: Diagnosis not present

## 2020-02-21 DIAGNOSIS — I10 Essential (primary) hypertension: Secondary | ICD-10-CM | POA: Diagnosis not present

## 2020-02-21 DIAGNOSIS — E118 Type 2 diabetes mellitus with unspecified complications: Secondary | ICD-10-CM | POA: Diagnosis not present

## 2020-02-21 DIAGNOSIS — E7800 Pure hypercholesterolemia, unspecified: Secondary | ICD-10-CM | POA: Diagnosis not present

## 2020-03-05 DIAGNOSIS — I10 Essential (primary) hypertension: Secondary | ICD-10-CM | POA: Diagnosis not present

## 2020-03-08 ENCOUNTER — Other Ambulatory Visit: Payer: Self-pay | Admitting: Cardiology

## 2020-03-08 DIAGNOSIS — I10 Essential (primary) hypertension: Secondary | ICD-10-CM

## 2020-03-08 DIAGNOSIS — I77819 Aortic ectasia, unspecified site: Secondary | ICD-10-CM

## 2020-03-11 NOTE — Progress Notes (Signed)
External Labs: Collected: 02/13/2020 Hemoglobin 12.3 g/dL Creatinine 0.9 mg/dL. eGFR: 87 mL/min per 1.73 m Potassium 5.6 Lipid profile: Total cholesterol 95, triglycerides 90, HDL 36, LDL 41, non-HDL 59. Hemoglobin A1c: 6.3

## 2020-03-11 NOTE — Progress Notes (Signed)
Primary Physician:  Deland Pretty, MD   Patient ID: [name] [name], male    DOB: Jan 18, 1946, 74 y.o.   MRN: [id number]   Date: 03/12/20 Last Office Visit: 01/15/2020  Subjective:    Chief Complaint  Patient presents with  . Hypertension  . Follow-up    8 weeks    HPI: [name] [name]  is a 74 y.o. male  with hypertension, lumbar spinal stenosis, obstructive sleep apnea on CPAP, aortic root dilation, advanced age, coronary artery calcification, former smoker, and obesity due to excess calories s/p gastric banding surgery in 2010 presents to the office with a chief complaint of " follow up for hypertension and review CT results."  Patient is unaccompanied at today's office visit.  Since last office visit patient denies any hospitalizations or urgent care visits for cardiovascular symptoms.  Patient has a history of a dilated aortic root initially found on echocardiogram and has been monitored with either 2D echo or CT of the aorta.  Since last office visit patient underwent CTA chest with contrast in July 2021.  He was noted to have stable mild aneurysmal dilatation of the ascending thoracic aorta maximal diameter of 4.4 cm and per report no significant interval change.  In the past patient has been educated on the importance of blood pressure management to help prevent and/or to delay progression of aortic dilatation.  At last office visit patient was started on losartan which she has tolerated well without any side effects or intolerances.    Home blood pressure average is 130/75 based on the BP log. Patient states that he is compliant with his antihypertensive medications.  Patient had labs done at an outside facility on February 13, 2020.  Independently reviewed the labs.  Patient was noted to have hyperkalemia with potassium at 5.6. He states that this was not addressed.    On the most recent CTA of the chest performed in July 2021.  Patient is also noted to have noncardiac findings of  scattered subcentimeter punctate calcified granulomas and 5 mm subpleural left lower lobe nodule.  These findings were conveyed to the patient and he is asked to follow-up with his PCP.  Past Medical History:  Diagnosis Date  . Coronary artery disease   . Difficulty urinating    prostate problem  . GERD (gastroesophageal reflux disease)    HIATAL HERNIA REPAIRED -with lap band 2 years ago NO LONGER HAS GERD  . History of elevated glucose    IN THE PAST - NO PROBLEMS SINCE GASTRIC BANDING WEIGHT LOSS   . Hyperlipidemia   . Hypertension    resolved with lap band 2 years ago  . Sleep apnea    uses cpap setting is 12    Past Surgical History:  Procedure Laterality Date  . COLONOSCOPY  07/17/11  . CYSTOSCOPY W/ URETERAL STENT PLACEMENT  07/18/2011   Procedure: CYSTOSCOPY WITH STENT REPLACEMENT;  Surgeon: Alexis Frock;  Location: [facility];  Service: Urology;  Laterality: Left;  Marland Kitchen GASTRIC RESTRICTION SURGERY  12/11/2008   lap band  . HEMORRHOID SURGERY N/A 08/04/2013   Procedure: EUA,HEMORRHOIDECTOMY;  Surgeon: [name], MD;  Location: [facility];  Service: General;  Laterality: N/A;  . LAPAROSCOPIC GASTRIC BANDING  12/11/2008  . LITHOTRIPSY      Social History   Tobacco Use  . Smoking status: Former Smoker    Packs/day: 2.00    Years: 8.00    Pack years: 16.00    Types: Cigarettes  Quit date: 04/05/1971    Years since quitting: 48.9  . Smokeless tobacco: Former Systems developer    Types: Snuff  Vaping Use  . Vaping Use: Never used  Substance Use Topics  . Alcohol use: Yes    Alcohol/week: 0.0 standard drinks    Comment: occas wine  . Drug use: No     Review of Systems  Constitutional: Negative for decreased appetite, malaise/fatigue, weight gain and weight loss.  Eyes: Negative for visual disturbance.  Cardiovascular: Positive for dyspnea on exertion (improved). Negative for chest pain, claudication, leg swelling (end of the day; improved with support stockings), orthopnea,  palpitations and syncope.  Respiratory: Negative for hemoptysis and wheezing.   Endocrine: Negative for cold intolerance and heat intolerance.  Hematologic/Lymphatic: Does not bruise/bleed easily.  Skin: Negative for nail changes.  Musculoskeletal: Negative for muscle weakness and myalgias.  Gastrointestinal: Negative for abdominal pain, change in bowel habit, nausea and vomiting.  Neurological: Negative for difficulty with concentration, dizziness, focal weakness and headaches.  Psychiatric/Behavioral: Negative for altered mental status and suicidal ideas.  All other systems reviewed and are negative.     Objective:  Blood pressure 137/82, pulse 60, resp. rate 16, height 6' (1.829 m), weight 292 lb (132.5 kg), SpO2 95 %. Body mass index is 39.6 kg/m.    Physical Exam Vitals reviewed.  Constitutional:      General: He is not in acute distress.    Appearance: He is well-developed.  HENT:     Head: Normocephalic and atraumatic.  Cardiovascular:     Rate and Rhythm: Normal rate and regular rhythm.     Pulses: Intact distal pulses.          Femoral pulses are 2+ on the right side and 2+ on the left side.      Popliteal pulses are 2+ on the right side and 2+ on the left side.       Dorsalis pedis pulses are 1+ on the right side and 1+ on the left side.       Posterior tibial pulses are 2+ on the right side and 2+ on the left side.     Heart sounds: Normal heart sounds.  Pulmonary:     Effort: Pulmonary effort is normal. No accessory muscle usage or respiratory distress.     Breath sounds: Normal breath sounds.  Abdominal:     General: Bowel sounds are normal.     Palpations: Abdomen is soft.  Musculoskeletal:        General: Normal range of motion.     Cervical back: Normal range of motion.  Skin:    General: Skin is warm and dry.  Neurological:     Mental Status: He is alert and oriented to person, place, and time.  Psychiatric:        Behavior: Behavior normal.     Radiology:  CTA of chest 12/07/2018:  1. The tubular ascending thoracic aorta is enlarged, measuring 4.5 x  4.4 cm. The aortic valve measures 2.6 cm. The sinuses of Valsalva measure 4.7 cm. The descending thoracic aorta is slightly enlarged measuring 3.2 x 3.2 cm. Prior examination dated 2009 is significantly limited by motion artifact and lack of intravenous contrast, however these measurements appear to be minimally increased, approximately 4.4 x 4.1 cm in the tubular ascending thoracic aorta and 3.2 x 3.0 cm in the descending aorta. The sinuses of Valsalva and aortic root cannot be adequately evaluated on prior examination. 2.  Coronary artery disease.  CTA  chest with contrast 01/29/2020 Stable mild aneurysmal dilatation of the ascending thoracic aorta, maximal diameter 4.4 cm. No significant interval change.  Negative for acute pulmonary embolus  No other acute intrathoracic finding  Native coronary atherosclerosis  Cholelithiasis  Aortic Atherosclerosis (ICD10-I70.0).  Lungs/Pleura: No focal pneumonia, collapse or consolidation. No acute airspace process or interstitial disease. There are a few scattered subcentimeter punctate calcified granulomas in the lingula, superior segment right lower lobe, and left lower lobe. Stable benign 5 mm subpleural left lower lobe nodule, image 104 series 7. No new or enlarging nodule.  Laboratory examination:   External Labs: Collected: 08/2019 Creatinine 1.0 mg/dL. eGFR: 77 mL/min per 1.73 m Potassium 4.9 Lipid profile: Total cholesterol 103, triglycerides 100, HDL 33 LDL 50, non-HDL 70 Hemoglobin A1c: 6.0  External Labs: Collected: 02/13/2020 Hemoglobin 12.3 g/dL Creatinine 0.9 mg/dL. eGFR: 87 mL/min per 1.73 m Potassium 5.6 Lipid profile: Total cholesterol 95, triglycerides 90, HDL 36, LDL 41, non-HDL 59. Hemoglobin A1c: 6.3  Medications Discontinued During This Encounter  Medication Reason  . spironolactone (ALDACTONE) 50 MG tablet  Patient Preference   Current Meds  Medication Sig  . alfuzosin (UROXATRAL) 10 MG 24 hr tablet Take 10 mg by mouth daily.   Marland Kitchen amLODipine (NORVASC) 10 MG tablet Take 10 mg by mouth daily.  Marland Kitchen aspirin EC 81 MG tablet Take 81 mg by mouth daily.  . Cholecalciferol (VITAMIN D3) 125 MCG (5000 UT) TABS Take 1 tablet by mouth daily.  Marland Kitchen gabapentin (NEURONTIN) 100 MG capsule Take 100 mg by mouth 2 (two) times a day.  . losartan (COZAAR) 25 MG tablet TAKE 1 TABLET BY MOUTH EVERY EVENING  . Multiple Vitamin (MULTIVITAMIN) tablet Take 1 tablet by mouth daily.  Marland Kitchen OVER THE COUNTER MEDICATION Stool softener daily  . pantoprazole (PROTONIX) 20 MG tablet Take 20 mg by mouth daily.   . rosuvastatin (CRESTOR) 10 MG tablet Take 10 mg by mouth daily.  . Zinc 50 MG TABS Take 1 tablet by mouth daily.  . [DISCONTINUED] spironolactone (ALDACTONE) 50 MG tablet TAKE 1 TABLET BY MOUTH EVERY DAY    Cardiac Studies:  EKG 03/21/2019: Probable sinus rhythm at 60 bpm, normal axis, early R wave progression. Cannot exlcude RVH. No evidence of ischemia, normal QT interval. No change from EKG 09/20/2018.  Echocardiogram 08/31/2018:  Left ventricle cavity is normal in size. Moderate concentric hypertrophy of the left ventricle. Normal global wall motion. Calculated EF 55%. Left atrial cavity is severely dilated. Moderate (Grade II) mitral regurgitation. Moderate tricuspid regurgitation. Moderate pulmonary hypertension. Estimated pulmonary artery systolic pressure 50 mmHg. The aortic root is dilated measuring 4.5 cm. Compared to previous study on 08/31/2017, there is mild increase in aortic root diameter and severity of pulmonary hypertension.  Exercise myoview stress test  05/01/2019: Patient exercised on Bruce protocol for 5:00  minutes and achieved  99 % of Max Predicted HR (Target HR was >85% MPHR) and  6.79 METS. Stress symptoms included fatigue and dyspnea. Normal BP response.  Exercise capacity was low. The perfusion  imaging study demonstrates mild diaphragmatic attenuation artifact and normal perfusion in all vascular territories. Dynamic gated images reveal normal endocardial thickening and left ventricular systolic function. LVEF calculated was   45% but visually appeared to be normal. This is a low risk study.  US Carotid Bilateral Cone 05/01/2015: Normal. Negative for stenosis.   ABI 09/29/2019: This exam reveals normal perfusion of the right lower extremity (ABI 1.19 ) and normal perfusion of the left lower extremity (ABI  1.26).   Sleep Study: Positive for Sleep Apnea; Uses CPAP  Assessment:     ICD-10-CM   1. Essential hypertension  Z00 Basic metabolic panel    Magnesium    CANCELED: Basic metabolic panel    CANCELED: Magnesium  2. Aortic dilatation (HCC)  I77.819   3. Coronary atherosclerosis due to calcified coronary lesion of native artery  I25.10    I25.84   4. Atherosclerosis of aorta (HCC)  I70.0   5. OSA on CPAP  G47.33    Z99.89   6. Former smoker  Z87.891   62. Encounter to discuss test results  Z71.2   8. Hyperkalemia  F74.9 Basic metabolic panel    Magnesium    CANCELED: Basic metabolic panel    CANCELED: Magnesium   Recommendations:  Aortic dilatation:  Results of the recent CT of the chest from July 2021 reviewed with the patient at today's office visit.  He is also aware of the noncardiac findings as noted above and is asked to follow-up with his PCP.    Educated on the importance of blood pressure management.  Monitor for now.  Benign essential hypertension: . Office blood pressure is acceptable. . Patient enrolled into principal care management for ambulatory blood pressure monitoring. BP log reviewed.  . Medication reconciled.  . Low salt diet recommended. A diet that is rich in fruits, vegetables, legumes, and low-fat dairy products and low in snacks, sweets, and meats (such as the Dietary Approaches to Stop Hypertension [DASH] diet).   Hyperkalemia:  Blood  work performed in outside facility on February 13, 2020 noted a potassium level 5.6.  Patient states that this has not been addressed.  Recommended to hold spironolactone completely for the next couple days and recheck kidney function and potassium levels on Friday, March 15, 2020.  If potassium levels are back to normal we will reinstate spironolactone at 25 mg p.o. daily as discussed at today's visit.  Patient understand that additional antihypertensive medications may be needed to bring his blood pressures closer to 120 mmHg.  This can be done as outpatient and in conjunction with principal care management who is monitoring his ambulatory blood pressures.   Coronary artery calcification noted on nongated CT:  Recommend aspirin and statin therapy.  Most recent echocardiogram and stress test 04/2019 reviewed and findings noted above.  Clinically patient denies any chest pain at rest or with effort related activities.  His dyspnea on exertion is stable. Continue to monitor.  Obesity, due to excess calories: Body mass index is 39.6 kg/m.  I reviewed with the patient the importance of diet, regular physical activity/exercise, weight loss.    Patient is educated on increasing physical activity gradually as tolerated.  With the goal of moderate intensity exercise for 30 minutes a day 5 days a week.  Former smoker: Educated on the importance of continued smoking cessation.  Independently reviewed the labs performed in outside facility findings summarized above.  Also reviewed the CT a of the chest performed in July 2021.  Patient is asked to follow-up with his PCP in regards to the noncardiac findings.  Orders Placed This Encounter  Procedures  . Basic metabolic panel  . Magnesium   --Continue cardiac medications as reconciled in final medication list. --Return in about 6 months (around 09/09/2020) for BP and aorta dilatation follow up .Marland Kitchen Or sooner if needed. --Continue follow-up with  your primary care physician regarding the management of your other chronic comorbid conditions.  Patient's questions and concerns  were addressed to his satisfaction. He voices understanding of the instructions provided during this encounter.   This note was created using a voice recognition software as a result there may be grammatical errors inadvertently enclosed that do not reflect the nature of this encounter. Every attempt is made to correct such errors.  Rex Kras, [place], [facility]  Pager: [phone] Office: [phone]

## 2020-03-12 ENCOUNTER — Encounter: Payer: Self-pay | Admitting: Cardiology

## 2020-03-12 ENCOUNTER — Ambulatory Visit: Payer: Medicare Other | Admitting: Cardiology

## 2020-03-12 ENCOUNTER — Other Ambulatory Visit: Payer: Self-pay

## 2020-03-12 VITALS — BP 137/82 | HR 60 | Resp 16 | Ht 72.0 in | Wt 292.0 lb

## 2020-03-12 DIAGNOSIS — I10 Essential (primary) hypertension: Secondary | ICD-10-CM | POA: Diagnosis not present

## 2020-03-12 DIAGNOSIS — Z712 Person consulting for explanation of examination or test findings: Secondary | ICD-10-CM | POA: Diagnosis not present

## 2020-03-12 DIAGNOSIS — I2584 Coronary atherosclerosis due to calcified coronary lesion: Secondary | ICD-10-CM | POA: Diagnosis not present

## 2020-03-12 DIAGNOSIS — E875 Hyperkalemia: Secondary | ICD-10-CM | POA: Diagnosis not present

## 2020-03-12 DIAGNOSIS — I700 Atherosclerosis of aorta: Secondary | ICD-10-CM

## 2020-03-12 DIAGNOSIS — I77819 Aortic ectasia, unspecified site: Secondary | ICD-10-CM | POA: Diagnosis not present

## 2020-03-12 DIAGNOSIS — G4733 Obstructive sleep apnea (adult) (pediatric): Secondary | ICD-10-CM

## 2020-03-12 DIAGNOSIS — I2510 Atherosclerotic heart disease of native coronary artery without angina pectoris: Secondary | ICD-10-CM

## 2020-03-12 DIAGNOSIS — Z87891 Personal history of nicotine dependence: Secondary | ICD-10-CM

## 2020-03-12 DIAGNOSIS — Z9989 Dependence on other enabling machines and devices: Secondary | ICD-10-CM | POA: Diagnosis not present

## 2020-03-15 DIAGNOSIS — I10 Essential (primary) hypertension: Secondary | ICD-10-CM | POA: Diagnosis not present

## 2020-03-16 ENCOUNTER — Other Ambulatory Visit: Payer: Self-pay | Admitting: Cardiology

## 2020-03-16 DIAGNOSIS — E875 Hyperkalemia: Secondary | ICD-10-CM

## 2020-03-16 DIAGNOSIS — I10 Essential (primary) hypertension: Secondary | ICD-10-CM

## 2020-03-16 LAB — MAGNESIUM: Magnesium: 1.8 mg/dL (ref 1.6–2.3)

## 2020-03-16 LAB — BASIC METABOLIC PANEL
BUN/Creatinine Ratio: 15 (ref 10–24)
BUN: 12 mg/dL (ref 8–27)
CO2: 20 mmol/L (ref 20–29)
Calcium: 9.2 mg/dL (ref 8.6–10.2)
Chloride: 105 mmol/L (ref 96–106)
Creatinine, Ser: 0.82 mg/dL (ref 0.76–1.27)
GFR calc Af Amer: 100 mL/min/{1.73_m2} (ref >59)
GFR calc non Af Amer: 87 mL/min/{1.73_m2} (ref >59)
Glucose: 145 mg/dL — ABNORMAL HIGH (ref 65–99)
Potassium: 4.8 mmol/L (ref 3.5–5.2)
Sodium: 139 mmol/L (ref 134–144)

## 2020-03-17 ENCOUNTER — Other Ambulatory Visit: Payer: Self-pay

## 2020-03-17 ENCOUNTER — Ambulatory Visit
Admission: EM | Admit: 2020-03-17 | Discharge: 2020-03-17 | Disposition: A | Discharge status: Home / Self Care | Payer: Medicare Other | Attending: Emergency Medicine | Admitting: Emergency Medicine

## 2020-03-17 ENCOUNTER — Ambulatory Visit (INDEPENDENT_AMBULATORY_CARE_PROVIDER_SITE_OTHER): Payer: Medicare Other

## 2020-03-17 ENCOUNTER — Encounter: Payer: Self-pay | Admitting: Emergency Medicine

## 2020-03-17 DIAGNOSIS — W19XXXA Unspecified fall, initial encounter: Secondary | ICD-10-CM | POA: Diagnosis not present

## 2020-03-17 DIAGNOSIS — S7002XA Contusion of left hip, initial encounter: Secondary | ICD-10-CM | POA: Diagnosis not present

## 2020-03-17 DIAGNOSIS — R52 Pain, unspecified: Secondary | ICD-10-CM | POA: Diagnosis not present

## 2020-03-17 DIAGNOSIS — M25552 Pain in left hip: Secondary | ICD-10-CM | POA: Diagnosis not present

## 2020-03-17 DIAGNOSIS — S50312A Abrasion of left elbow, initial encounter: Secondary | ICD-10-CM
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
## 2020-03-17 NOTE — Discharge Instructions (Addendum)
Heat therapy (hot compress, warm wash rag, hot showers, etc.) can help relax muscles and soothe muscle aches. Cold therapy (ice packs) can be used to help swelling both after injury and after prolonged use of areas of chronic pain/aches.  Pain medication:  350 mg-1000 mg of Tylenol (acetaminophen) and/or 200 mg - 800 mg of Advil (ibuprofen, Motrin) every 8 hours as needed.  May alternate between the two throughout the day as they are generally safe to take together.  DO NOT exceed more than 3000 mg of Tylenol or 3200 mg of ibuprofen in a 24 hour period as this could damage your stomach, kidneys, liver, or increase your bleeding risk.  Important to follow up with specialist(s) below for further evaluation/management if your symptoms persist or worsen. 

## 2020-03-18 NOTE — Progress Notes (Signed)
Left vm to cb.

## 2020-03-19 NOTE — Progress Notes (Signed)
Patient will restart Spironolactone 25mg  daily and understands to go to [facility] to have repeat blood work to re-evaluate potassium levels in 1 week. He also wanted me to let you know he is grateful for you and your staff's care about his health.

## 2020-03-26 ENCOUNTER — Other Ambulatory Visit: Payer: Self-pay

## 2020-03-26 DIAGNOSIS — E875 Hyperkalemia: Secondary | ICD-10-CM

## 2020-03-26 DIAGNOSIS — I10 Essential (primary) hypertension: Secondary | ICD-10-CM

## 2020-03-29 ENCOUNTER — Other Ambulatory Visit: Payer: Self-pay | Admitting: Cardiology

## 2020-03-29 DIAGNOSIS — I10 Essential (primary) hypertension: Secondary | ICD-10-CM

## 2020-03-29 DIAGNOSIS — Z23 Encounter for immunization: Secondary | ICD-10-CM | POA: Diagnosis not present

## 2020-03-29 DIAGNOSIS — E875 Hyperkalemia: Secondary | ICD-10-CM

## 2020-03-30 LAB — BASIC METABOLIC PANEL
BUN/Creatinine Ratio: 14 (ref 10–24)
BUN: 11 mg/dL (ref 8–27)
CO2: 22 mmol/L (ref 20–29)
Calcium: 9.2 mg/dL (ref 8.6–10.2)
Chloride: 104 mmol/L (ref 96–106)
Creatinine, Ser: 0.77 mg/dL (ref 0.76–1.27)
GFR calc Af Amer: 103 mL/min/{1.73_m2} (ref >59)
GFR calc non Af Amer: 89 mL/min/{1.73_m2} (ref >59)
Glucose: 131 mg/dL — ABNORMAL HIGH (ref 65–99)
Potassium: 4.7 mmol/L (ref 3.5–5.2)
Sodium: 141 mmol/L (ref 134–144)

## 2020-04-04 DIAGNOSIS — I10 Essential (primary) hypertension: Secondary | ICD-10-CM | POA: Diagnosis not present

## 2020-04-05 ENCOUNTER — Telehealth: Payer: Self-pay

## 2020-04-05 DIAGNOSIS — I10 Essential (primary) hypertension: Secondary | ICD-10-CM

## 2020-04-05 NOTE — Telephone Encounter (Signed)
-----   Message from [place], [place] sent at 04/04/2020  5:26 PM EDT -----  ----- Message ----- From: Rex Kras, DO Sent: 04/04/2020   9:59 AM EDT To: Gaye Alken, CMA  Most recent lab work notes stable kidney function and electrolytes.  Continue current medical therapy.  Please find out how much spironolactone he is taking and update the MAR.

## 2020-04-05 NOTE — Telephone Encounter (Signed)
Called pt to inform, him about his lab results. Pt mention he does not know how he is taking his spironolactone. Pt mention his wife know more.

## 2020-04-05 NOTE — Telephone Encounter (Signed)
Please call him back and make sure his medications are reconciled appropriately.

## 2020-04-10 NOTE — Progress Notes (Signed)
Called pt. Pt understood

## 2020-04-11 NOTE — Telephone Encounter (Signed)
Call went straight to VMbox, no VMbox setup yet. Could not leave a message.

## 2020-04-11 NOTE — Telephone Encounter (Signed)
Please call.

## 2020-04-12 DIAGNOSIS — Z23 Encounter for immunization: Secondary | ICD-10-CM | POA: Diagnosis not present

## 2020-04-12 DIAGNOSIS — N5201 Erectile dysfunction due to arterial insufficiency: Secondary | ICD-10-CM | POA: Diagnosis not present

## 2020-04-24 DIAGNOSIS — Z09 Encounter for follow-up examination after completed treatment for conditions other than malignant neoplasm: Secondary | ICD-10-CM | POA: Diagnosis not present

## 2020-04-24 DIAGNOSIS — R1310 Dysphagia, unspecified: Secondary | ICD-10-CM | POA: Diagnosis not present

## 2020-04-24 DIAGNOSIS — R109 Unspecified abdominal pain: Secondary | ICD-10-CM | POA: Diagnosis not present

## 2020-04-24 DIAGNOSIS — Z9884 Bariatric surgery status: Secondary | ICD-10-CM | POA: Diagnosis not present

## 2020-04-25 DIAGNOSIS — K219 Gastro-esophageal reflux disease without esophagitis: Secondary | ICD-10-CM | POA: Diagnosis not present

## 2020-04-25 DIAGNOSIS — Z9884 Bariatric surgery status: Secondary | ICD-10-CM | POA: Diagnosis not present

## 2020-04-25 DIAGNOSIS — R1310 Dysphagia, unspecified: Secondary | ICD-10-CM | POA: Diagnosis not present

## 2020-04-26 MED ORDER — SPIRONOLACTONE 25 MG PO TABS: 25.0000 mg | ORAL_TABLET | Freq: Every morning | ORAL | 0 refills | Status: DC

## 2020-04-26 NOTE — Addendum Note (Signed)
Addended by: Oran Rein on: 04/26/2020 12:20 PM   Modules accepted: Orders

## 2020-04-26 NOTE — Telephone Encounter (Signed)
Called and reviewed home BP readings with pt. Home BP readings stable and at goals. Reviewed recent lab results with pt. Updated med list together. Pt noted to be taking 25 mg spironolactone daily. Instructed pt to continue current therapy

## 2020-05-05 DIAGNOSIS — I10 Essential (primary) hypertension: Secondary | ICD-10-CM | POA: Diagnosis not present

## 2020-05-09 DIAGNOSIS — K9509 Other complications of gastric band procedure: Secondary | ICD-10-CM | POA: Diagnosis not present

## 2020-05-13 NOTE — Patient Instructions (Addendum)
DUE TO COVID-19 ONLY ONE VISITOR IS ALLOWED TO COME WITH YOU AND STAY IN THE WAITING ROOM ONLY DURING PRE OP AND PROCEDURE DAY OF SURGERY. THE 1 VISITOR  MAY VISIT WITH YOU AFTER SURGERY IN YOUR PRIVATE ROOM DURING VISITING HOURS ONLY!  YOU NEED TO HAVE A COVID 19 TEST ON Thursday 05/16/2020 @__0900  am _____, THIS TEST MUST BE DONE BEFORE SURGERY,  COVID TESTING SITE [address] [place] [address], IT IS ON THE RIGHT GOING OUT [address] APPROXIMATELY  2 MINUTES PAST [employer] ON THE RIGHT. ONCE YOUR COVID TEST IS COMPLETED,  PLEASE BEGIN THE QUARANTINE INSTRUCTIONS AS OUTLINED IN YOUR HANDOUT.                MUSA REWERTS  05/13/2020   Your procedure is scheduled on: Monday 05/20/2020   Report to [facility] Main  Entrance   Report to admitting at [place]     Call this number if you have problems the morning of surgery [phone]    Remember: Do not eat food  :After Midnight. You may have clear liquids until            0430 am then nothing by mouth.  ____________________________________________________________________    CLEAR LIQUID DIET  Until 0430 am the morning then nothing by mouth     Foods Allowed                                                                                        Foods Excluded  Black Coffee and tea, regular and decaf                                              liquids that you cannot  Plain Jell-O any favor except red or purple                                           see through such as: Fruit ices (not with fruit pulp)                                                              milk, soups, orange juice  Iced Popsicles                                                                All solid food Carbonated beverages, regular and diet  Cranberry, grape and apple juices Sports drinks like Gatorade Lightly seasoned clear broth or  consume(fat free) Sugar, honey syrup   _____________________________________________________________________    BRUSH YOUR TEETH MORNING OF SURGERY AND RINSE YOUR MOUTH OUT, NO CHEWING GUM CANDY OR MINTS.     Take these medicines the morning of surgery with A SIP OF WATER: Alfuzosin, Amlodipine, Gabapentin, Pantoprazole (Protonix)                                  You may not have any metal on your body including hair pins and              piercings  Do not wear jewelry, lotions, powders or colognes, deodorant                     Men may shave face and neck.   Do not bring valuables to the hospital. [place].  Contacts, dentures or bridgework may not be worn into surgery.  Leave suitcase in the car. After surgery it may be brought to your room.     Patients discharged the day of surgery will not be allowed to drive home. IF YOU ARE HAVING SURGERY AND GOING HOME THE SAME DAY, YOU MUST HAVE AN ADULT TO DRIVE YOU HOME AND BE WITH YOU FOR 24 HOURS. YOU MAY GO HOME BY TAXI OR UBER OR ORTHERWISE, BUT AN ADULT MUST ACCOMPANY YOU HOME AND STAY WITH YOU FOR 24 HOURS.  _____________________________________________________________________             [facility] - Preparing for Surgery Before surgery, you can play an important role.  Because skin is not sterile, your skin needs to be as free of germs as possible.  You can reduce the number of germs on your skin by washing with CHG (chlorahexidine gluconate) soap before surgery.  CHG is an antiseptic cleaner which kills germs and bonds with the skin to continue killing germs even after washing. Please DO NOT use if you have an allergy to CHG or antibacterial soaps.  If your skin becomes reddened/irritated stop using the CHG and inform your nurse when you arrive at Short Stay. Do not shave (including legs and underarms) for at least 48 hours prior to the first CHG shower.  You may shave your  face/neck. Please follow these instructions carefully:  1.  Shower with CHG Soap the night before surgery and the  morning of Surgery.  2.  If you choose to wash your hair, wash your hair first as usual with your  normal  shampoo.  3.  After you shampoo, rinse your hair and body thoroughly to remove the  shampoo.                           4.  Use CHG as you would any other liquid soap.  You can apply chg directly  to the skin and wash                       Gently with a scrungie or clean washcloth.  5.  Apply the CHG Soap to your body ONLY FROM THE NECK DOWN.   Do not use on face/ open  Wound or open sores. Avoid contact with eyes, ears mouth and genitals (private parts).                       Wash face,  Genitals (private parts) with your normal soap.             6.  Wash thoroughly, paying special attention to the area where your surgery  will be performed.  7.  Thoroughly rinse your body with warm water from the neck down.  8.  DO NOT shower/wash with your normal soap after using and rinsing off  the CHG Soap.                9.  Pat yourself dry with a clean towel.            10.  Wear clean pajamas.            11.  Place clean sheets on your bed the night of your first shower and do not  sleep with pets. Day of Surgery : Do not apply any lotions/deodorants the morning of surgery.  Please wear clean clothes to the hospital/surgery center.  FAILURE TO FOLLOW THESE INSTRUCTIONS MAY RESULT IN THE CANCELLATION OF YOUR SURGERY PATIENT SIGNATURE_________________________________  NURSE SIGNATURE__________________________________  ________________________________________________________________________

## 2020-05-13 NOTE — Progress Notes (Signed)
Please place surgical orders in epic for surgical procedure scheduled for Monday 05/20/2020. Patient has a PAT appt scheduled for 05/16/2020. Thanks

## 2020-05-16 ENCOUNTER — Other Ambulatory Visit (HOSPITAL_COMMUNITY)
Admission: RE | Admit: 2020-05-16 | Discharge: 2020-05-16 | Disposition: A | Discharge status: Home / Self Care | Payer: Medicare Other | Source: Ambulatory Visit | Attending: Surgery | Admitting: Surgery

## 2020-05-16 ENCOUNTER — Encounter (HOSPITAL_COMMUNITY)
Admission: RE | Admit: 2020-05-16 | Discharge: 2020-05-16 | Disposition: A | Discharge status: Home / Self Care | Payer: Medicare Other | Source: Ambulatory Visit | Attending: Surgery | Admitting: Surgery

## 2020-05-16 ENCOUNTER — Encounter (HOSPITAL_COMMUNITY): Payer: Self-pay

## 2020-05-16 ENCOUNTER — Other Ambulatory Visit: Payer: Self-pay

## 2020-05-16 DIAGNOSIS — Z20822 Contact with and (suspected) exposure to covid-19: Secondary | ICD-10-CM | POA: Insufficient documentation

## 2020-05-16 DIAGNOSIS — G4730 Sleep apnea, unspecified: Secondary | ICD-10-CM | POA: Insufficient documentation

## 2020-05-16 DIAGNOSIS — Z7901 Long term (current) use of anticoagulants: Secondary | ICD-10-CM | POA: Insufficient documentation

## 2020-05-16 DIAGNOSIS — K219 Gastro-esophageal reflux disease without esophagitis: Secondary | ICD-10-CM | POA: Diagnosis not present

## 2020-05-16 DIAGNOSIS — Z01812 Encounter for preprocedural laboratory examination: Secondary | ICD-10-CM | POA: Diagnosis not present

## 2020-05-16 DIAGNOSIS — I272 Pulmonary hypertension, unspecified: Secondary | ICD-10-CM | POA: Insufficient documentation

## 2020-05-16 DIAGNOSIS — Z87891 Personal history of nicotine dependence: Secondary | ICD-10-CM | POA: Insufficient documentation

## 2020-05-16 DIAGNOSIS — Z7982 Long term (current) use of aspirin: Secondary | ICD-10-CM | POA: Insufficient documentation

## 2020-05-16 DIAGNOSIS — Z79899 Other long term (current) drug therapy: Secondary | ICD-10-CM | POA: Diagnosis not present

## 2020-05-16 DIAGNOSIS — I10 Essential (primary) hypertension: Secondary | ICD-10-CM | POA: Diagnosis not present

## 2020-05-16 DIAGNOSIS — K5669 Other partial intestinal obstruction: Secondary | ICD-10-CM | POA: Diagnosis not present

## 2020-05-16 HISTORY — DX: Pneumonia, unspecified organism: J18.9

## 2020-05-16 HISTORY — DX: Personal history of urinary calculi: Z87.442

## 2020-05-16 HISTORY — DX: Pulmonary hypertension, unspecified: I27.20

## 2020-05-16 HISTORY — DX: Unspecified osteoarthritis, unspecified site: M19.90

## 2020-05-16 HISTORY — DX: Aneurysm of the ascending aorta, without rupture: I71.21

## 2020-05-16 HISTORY — DX: Thoracic aortic aneurysm, without rupture: I71.2

## 2020-05-16 LAB — BASIC METABOLIC PANEL
Anion gap: 7 (ref 5–15)
BUN: 14 mg/dL (ref 8–23)
CO2: 26 mmol/L (ref 22–32)
Calcium: 9.2 mg/dL (ref 8.9–10.3)
Chloride: 109 mmol/L (ref 98–111)
Creatinine, Ser: 0.65 mg/dL (ref 0.61–1.24)
GFR, Estimated: >60 mL/min (ref >60)
Glucose, Bld: 116 mg/dL — ABNORMAL HIGH (ref 70–99)
Potassium: 4.4 mmol/L (ref 3.5–5.1)
Sodium: 142 mmol/L (ref 135–145)

## 2020-05-16 LAB — CBC
HCT: 39.3 % (ref 39.0–52.0)
Hemoglobin: 12.9 g/dL — ABNORMAL LOW (ref 13.0–17.0)
MCH: 32.7 pg (ref 26.0–34.0)
MCHC: 32.8 g/dL (ref 30.0–36.0)
MCV: 99.5 fL (ref 80.0–100.0)
Platelets: 213 10*3/uL (ref 150–400)
RBC: 3.95 MIL/uL — ABNORMAL LOW (ref 4.22–5.81)
RDW: 11.7 % (ref 11.5–15.5)
WBC: 6.7 10*3/uL (ref 4.0–10.5)
nRBC: 0 % (ref 0.0–0.2)

## 2020-05-16 LAB — SARS CORONAVIRUS 2 (TAT 6-24 HRS): SARS Coronavirus 2: NEGATIVE

## 2020-05-16 NOTE — Progress Notes (Addendum)
PCP - Tressia Miners  Cardiologist - Rex Kras, DO Progress note epic 03-12-2020  PPM/ICD -  Device Orders -  Rep Notified -   Chest x-ray - CTA 01-29-20  epic EKG - 09-19-19 epic Stress Test - 05-01-19 epic ECHO - 08-31-18 epic Cardiac Cath -   Sleep Study -  CPAP - yes  Fasting Blood Sugar -  Checks Blood Sugar _____ times a day  Blood Thinner Instructions: Aspirin Instructions:81 mg  ERAS Protcol -NO PRE-SURGERY   COVID TEST- 11-11  Activity- can walk a flight of stairs without sob,does yard work  Anesthesia review: Ascending Thoracic Aorta 4.5x 4.4cm, OSA wears cpap, moderate pulmonary hypertension,  Patient denies shortness of breath, fever, cough and chest pain at PAT appointment  NONE   All instructions explained to the patient, with a verbal understanding of the material. Patient agrees to go over the instructions while at home for a better understanding. Patient also instructed to self quarantine after being tested for COVID-19. The opportunity to ask questions was provided.

## 2020-05-17 NOTE — Anesthesia Preprocedure Evaluation (Addendum)
Anesthesia Evaluation  Patient identified by MRN, date of birth, ID band Patient awake    Reviewed: Allergy & Precautions, NPO status , Patient's Chart, lab work & pertinent test results  Airway Mallampati: II  TM Distance: >3 FB Neck ROM: Full    Dental  (+) Teeth Intact, Dental Advisory Given, Caps   Pulmonary sleep apnea and Continuous Positive Airway Pressure Ventilation , former smoker,    breath sounds clear to auscultation       Cardiovascular hypertension, Pt. on medications + CAD   Rhythm:Regular Rate:Normal     Neuro/Psych negative neurological ROS  negative psych ROS   GI/Hepatic Neg liver ROS, GERD  Medicated,  Endo/Other  diabetes  Renal/GU negative Renal ROS     Musculoskeletal  (+) Arthritis ,   Abdominal (+) + obese,   Peds  Hematology negative hematology ROS (+)   Anesthesia Other Findings   Reproductive/Obstetrics                           Anesthesia Physical Anesthesia Plan  ASA: III  Anesthesia Plan: General   Post-op Pain Management:    Induction: Intravenous  PONV Risk Score and Plan: 3 and Ondansetron, Dexamethasone and Treatment may vary due to age or medical condition  Airway Management Planned: Oral ETT  Additional Equipment: None  Intra-op Plan:   Post-operative Plan: Extubation in OR  Informed Consent: I have reviewed the patients History and Physical, chart, labs and discussed the procedure including the risks, benefits and alternatives for the proposed anesthesia with the patient or authorized representative who has indicated his/her understanding and acceptance.     Dental advisory given  Plan Discussed with: CRNA  Anesthesia Plan Comments: (See PAT note 05/16/2020, Konrad Felix, PA-C)       Anesthesia Quick Evaluation

## 2020-05-17 NOTE — Progress Notes (Signed)
Anesthesia Chart Review   Case: [id number] Date/Time: 05/20/20 0715   Procedure: REMOVAL OF LAPBAND (N/A )   Anesthesia type: General   Pre-op diagnosis: partial obstruction of lapband   Location: [facility] ROOM 05 / [facility]   Surgeons: Johnathan Hausen, MD      DISCUSSION:74 y.o. former smoker (16 pack years, quit 04/05/71) with h/o HTN, sleep apnea, GERD, pulmonary HTN, aortic root dilation (Stable mild aneurysmal dilatation of the ascending thoracic aorta, maximal diameter 4.4 cm, followed by cardiology), partial obstruction of lapband scheduled for above procedure 05/20/2020 with Dr. Johnathan Hausen.   Pt last seen by cardiology 03/12/2020. Stable at this visit.   Anticipate pt can proceed with planned procedure barring acute status change.   VS: BP 133/75   Pulse 60   Temp 36.5 C (Oral)   Resp 16   Ht 6' (1.829 m)   Wt 123.8 kg   SpO2 98%   BMI 37.03 kg/m   PROVIDERS: Deland Pretty, MD is PCP   Rex Kras, DO is Cardiologist  LABS: Labs reviewed: Acceptable for surgery. (all labs ordered are listed, but only abnormal results are displayed)  Labs Reviewed  BASIC METABOLIC PANEL - Abnormal; Notable for the following components:      Result Value   Glucose, Bld 116 (*)    All other components within normal limits  CBC - Abnormal; Notable for the following components:   RBC 3.95 (*)    Hemoglobin 12.9 (*)    All other components within normal limits     IMAGES:   EKG: 09/19/2019: Normal sinus rhythm without underlying ischemia or injury pattern.  CV:  Exercise myoview stress test  05/01/2019: Patient exercised on Bruce protocol. The resting electrocardiogram demonstrated normal sinus rhythm, poor R progression. Stress EKG was normal.  There were no significant arrhythmias. Patient exercised on Bruce protocol for 5:00  minutes and achieved  99 % of Max Predicted HR (Target HR was >85% MPHR) and  6.79 METS. Stress symptoms included fatigue and dyspnea. Normal BP response.   Exercise capacity was low. The perfusion imaging study demonstrates mild diaphragmatic attenuation artifact and normal perfusion in all vascular territories. Dynamic gated images reveal normal endocardial thickening and left ventricular systolic function. LVEF calculated was   45% but visually appeared to be normal. This is a low risk study. No previous exam available for comparison.  Echocardiogram 08/31/2018:  Left ventricle cavity is normal in size. Moderate concentric hypertrophy  of the left ventricle. Normal global wall motion. Calculated EF 55%.  Left atrial cavity is severely dilated.  Moderate (Grade II) mitral regurgitation.  Moderate tricuspid regurgitation. Moderate pulmonary hypertension.  Estimated pulmonary artery systolic pressure 50 mmHg.  The aortic root is dilated measuring 4.5 cm.  Compared to previous study on 08/31/2017, there is mild increase in aortic  root diameter and severity of pulmonary hypertension.  Past Medical History:  Diagnosis Date  . Aneurysm, ascending aorta (HCC)   . Arthritis   . Coronary artery disease    pt denies  . Difficulty urinating    prostate problem  . GERD (gastroesophageal reflux disease)    HIATAL HERNIA REPAIRED -with lap band 2 years ago NO LONGER HAS GERD  . History of elevated glucose    IN THE PAST - NO PROBLEMS SINCE GASTRIC BANDING WEIGHT LOSS   . History of kidney stones   . Hyperlipidemia   . Hypertension    resolved with lap band 2 years ago  . Pneumonia   .  Pulmonary hypertension ([place])    per echo report  pt unaware  . Sleep apnea    uses cpap setting is 12    Past Surgical History:  Procedure Laterality Date  . COLONOSCOPY  07/17/11  . CYSTOSCOPY W/ URETERAL STENT PLACEMENT  07/18/2011   Procedure: CYSTOSCOPY WITH STENT REPLACEMENT;  Surgeon: Alexis Frock;  Location: [facility];  Service: Urology;  Laterality: Left;  Marland Kitchen GASTRIC RESTRICTION SURGERY  12/11/2008   lap band  . HEMORRHOID SURGERY N/A 08/04/2013    Procedure: EUA,HEMORRHOIDECTOMY;  Surgeon: [name], MD;  Location: [facility];  Service: General;  Laterality: N/A;  . LAPAROSCOPIC GASTRIC BANDING  12/11/2008  . LITHOTRIPSY      MEDICATIONS: . acetaminophen (TYLENOL) 500 MG tablet  . alfuzosin (UROXATRAL) 10 MG 24 hr tablet  . amLODipine (NORVASC) 10 MG tablet  . aspirin EC 81 MG tablet  . Cholecalciferol (VITAMIN D3) 125 MCG (5000 UT) TABS  . diphenhydrAMINE HCl, Sleep, (ZZZQUIL) 25 MG CAPS  . docusate sodium (COLACE) 100 MG capsule  . gabapentin (NEURONTIN) 100 MG capsule  . losartan (COZAAR) 25 MG tablet  . Methylsulfonylmethane (MSM) 1000 MG CAPS  . Multiple Vitamin (MULTIVITAMIN) tablet  . pantoprazole (PROTONIX) 20 MG tablet  . Prasterone, DHEA, (DHEA 50) 50 MG CAPS  . rosuvastatin (CRESTOR) 10 MG tablet  . spironolactone (ALDACTONE) 25 MG tablet  . TURMERIC-GINGER PO  . Zinc 50 MG TABS   No current facility-administered medications for this encounter.     Konrad Felix, PA-C [facility] Pre-Surgical Testing [phone]

## 2020-05-19 MED ORDER — DEXTROSE 5 % IV SOLN
3.0000 g | INTRAVENOUS | Status: AC
  Administered 2020-05-20: 3 g via INTRAVENOUS
  Filled 2020-05-19: qty 3

## 2020-05-19 MED ORDER — BUPIVACAINE LIPOSOME 1.3 % IJ SUSP
20.0000 mL | Freq: Once | INTRAMUSCULAR | Status: DC
  Filled 2020-05-19: qty 20

## 2020-05-20 ENCOUNTER — Inpatient Hospital Stay (HOSPITAL_COMMUNITY): Payer: Medicare Other | Admitting: Certified Registered"

## 2020-05-20 ENCOUNTER — Ambulatory Visit (HOSPITAL_COMMUNITY)
Admission: RE | Admit: 2020-05-20 | Discharge: 2020-05-20 | Disposition: A | Discharge status: Home / Self Care | Payer: Medicare Other | Attending: Surgery | Admitting: Surgery

## 2020-05-20 ENCOUNTER — Encounter (HOSPITAL_COMMUNITY): Payer: Self-pay | Admitting: Surgery

## 2020-05-20 ENCOUNTER — Encounter (HOSPITAL_COMMUNITY)
Admission: RE | Disposition: A | Discharge status: Home / Self Care | Payer: Self-pay | Source: Home / Self Care | Attending: Surgery

## 2020-05-20 ENCOUNTER — Inpatient Hospital Stay (HOSPITAL_COMMUNITY): Payer: Medicare Other | Admitting: Physician Assistant

## 2020-05-20 DIAGNOSIS — Y848 Other medical procedures as the cause of abnormal reaction of the patient, or of later complication, without mention of misadventure at the time of the procedure: Secondary | ICD-10-CM | POA: Diagnosis not present

## 2020-05-20 DIAGNOSIS — Z87891 Personal history of nicotine dependence: Secondary | ICD-10-CM | POA: Diagnosis not present

## 2020-05-20 DIAGNOSIS — T85898A Other specified complication of other internal prosthetic devices, implants and grafts, initial encounter: Secondary | ICD-10-CM | POA: Diagnosis not present

## 2020-05-20 DIAGNOSIS — I2510 Atherosclerotic heart disease of native coronary artery without angina pectoris: Secondary | ICD-10-CM | POA: Diagnosis not present

## 2020-05-20 DIAGNOSIS — E119 Type 2 diabetes mellitus without complications: Secondary | ICD-10-CM | POA: Diagnosis not present

## 2020-05-20 DIAGNOSIS — I10 Essential (primary) hypertension: Secondary | ICD-10-CM | POA: Diagnosis not present

## 2020-05-20 DIAGNOSIS — K9509 Other complications of gastric band procedure: Secondary | ICD-10-CM | POA: Insufficient documentation

## 2020-05-20 SURGERY — REMOVAL, GASTRIC BAND, LAPAROSCOPIC
Anesthesia: General | Site: Abdomen

## 2020-05-20 MED ORDER — ACETAMINOPHEN 500 MG PO TABS
1000.0000 mg | ORAL_TABLET | ORAL | Status: AC
  Administered 2020-05-20: 1000 mg via ORAL
  Filled 2020-05-20: qty 2

## 2020-05-20 MED ORDER — FENTANYL CITRATE (PF) 100 MCG/2ML IJ SOLN
INTRAMUSCULAR | Status: DC | PRN
  Administered 2020-05-20 (×4): 50 ug via INTRAVENOUS

## 2020-05-20 MED ORDER — ACETAMINOPHEN 325 MG PO TABS: 325.0000 mg | ORAL_TABLET | Freq: Once | ORAL | Status: DC | PRN

## 2020-05-20 MED ORDER — FENTANYL CITRATE (PF) 100 MCG/2ML IJ SOLN
INTRAMUSCULAR | Status: AC
  Filled 2020-05-20: qty 2

## 2020-05-20 MED ORDER — LACTATED RINGERS IV SOLN: INTRAVENOUS | Status: DC

## 2020-05-20 MED ORDER — HEPARIN SODIUM (PORCINE) 5000 UNIT/ML IJ SOLN
5000.0000 [IU] | Freq: Once | INTRAMUSCULAR | Status: AC
  Administered 2020-05-20: 5000 [IU] via SUBCUTANEOUS
  Filled 2020-05-20: qty 1

## 2020-05-20 MED ORDER — SODIUM CHLORIDE (PF) 0.9 % IJ SOLN
INTRAMUSCULAR | Status: AC
  Filled 2020-05-20: qty 10

## 2020-05-20 MED ORDER — SUGAMMADEX SODIUM 500 MG/5ML IV SOLN
INTRAVENOUS | Status: AC
  Filled 2020-05-20: qty 5

## 2020-05-20 MED ORDER — HYDROCODONE-ACETAMINOPHEN 5-325 MG PO TABS: 1.0000 | ORAL_TABLET | Freq: Four times a day (QID) | ORAL | 0 refills | Status: DC | PRN

## 2020-05-20 MED ORDER — AMISULPRIDE (ANTIEMETIC) 5 MG/2ML IV SOLN: 10.0000 mg | Freq: Once | INTRAVENOUS | Status: DC | PRN

## 2020-05-20 MED ORDER — ONDANSETRON HCL 4 MG PO TABS: 4.0000 mg | ORAL_TABLET | Freq: Three times a day (TID) | ORAL | 0 refills | Status: DC | PRN

## 2020-05-20 MED ORDER — PROPOFOL 10 MG/ML IV BOLUS
INTRAVENOUS | Status: AC
  Filled 2020-05-20: qty 20

## 2020-05-20 MED ORDER — DEXAMETHASONE SODIUM PHOSPHATE 10 MG/ML IJ SOLN
INTRAMUSCULAR | Status: AC
  Filled 2020-05-20: qty 1

## 2020-05-20 MED ORDER — DEXAMETHASONE SODIUM PHOSPHATE 10 MG/ML IJ SOLN
INTRAMUSCULAR | Status: DC | PRN
  Administered 2020-05-20: 4 mg via INTRAVENOUS

## 2020-05-20 MED ORDER — CHLORHEXIDINE GLUCONATE CLOTH 2 % EX PADS: 6.0000 | MEDICATED_PAD | Freq: Once | CUTANEOUS | Status: DC

## 2020-05-20 MED ORDER — LIDOCAINE 2% (20 MG/ML) 5 ML SYRINGE
INTRAMUSCULAR | Status: AC
  Filled 2020-05-20: qty 5

## 2020-05-20 MED ORDER — MEPERIDINE HCL 50 MG/ML IJ SOLN: 6.2500 mg | INTRAMUSCULAR | Status: DC | PRN

## 2020-05-20 MED ORDER — ONDANSETRON HCL 4 MG/2ML IJ SOLN
INTRAMUSCULAR | Status: DC | PRN
  Administered 2020-05-20: 4 mg via INTRAVENOUS

## 2020-05-20 MED ORDER — FENTANYL CITRATE (PF) 100 MCG/2ML IJ SOLN
25.0000 ug | INTRAMUSCULAR | Status: DC | PRN
  Administered 2020-05-20: 25 ug via INTRAVENOUS
  Administered 2020-05-20: 50 ug via INTRAVENOUS

## 2020-05-20 MED ORDER — ACETAMINOPHEN 160 MG/5ML PO SOLN: 325.0000 mg | Freq: Once | ORAL | Status: DC | PRN

## 2020-05-20 MED ORDER — ORAL CARE MOUTH RINSE: 15.0000 mL | Freq: Once | OROMUCOSAL | Status: AC

## 2020-05-20 MED ORDER — SCOPOLAMINE 1 MG/3DAYS TD PT72
1.0000 | MEDICATED_PATCH | TRANSDERMAL | Status: DC
  Administered 2020-05-20: 1.5 mg via TRANSDERMAL
  Filled 2020-05-20: qty 1

## 2020-05-20 MED ORDER — OXYCODONE HCL 5 MG PO TABS
5.0000 mg | ORAL_TABLET | Freq: Once | ORAL | Status: AC | PRN
  Administered 2020-05-20: 5 mg via ORAL

## 2020-05-20 MED ORDER — LIDOCAINE 2% (20 MG/ML) 5 ML SYRINGE
INTRAMUSCULAR | Status: DC | PRN
  Administered 2020-05-20: 60 mg via INTRAVENOUS

## 2020-05-20 MED ORDER — SUGAMMADEX SODIUM 200 MG/2ML IV SOLN
INTRAVENOUS | Status: DC | PRN
  Administered 2020-05-20: 260 mg via INTRAVENOUS

## 2020-05-20 MED ORDER — 0.9 % SODIUM CHLORIDE (POUR BTL) OPTIME
TOPICAL | Status: DC | PRN
  Administered 2020-05-20: 1000 mL

## 2020-05-20 MED ORDER — BUPIVACAINE LIPOSOME 1.3 % IJ SUSP
INTRAMUSCULAR | Status: DC | PRN
  Administered 2020-05-20: 20 mL

## 2020-05-20 MED ORDER — SODIUM CHLORIDE (PF) 0.9 % IJ SOLN
INTRAMUSCULAR | Status: DC | PRN
  Administered 2020-05-20: 10 mL

## 2020-05-20 MED ORDER — CHLORHEXIDINE GLUCONATE 0.12 % MT SOLN
15.0000 mL | Freq: Once | OROMUCOSAL | Status: AC
  Administered 2020-05-20: 15 mL via OROMUCOSAL

## 2020-05-20 MED ORDER — ROCURONIUM BROMIDE 10 MG/ML (PF) SYRINGE
PREFILLED_SYRINGE | INTRAVENOUS | Status: DC | PRN
  Administered 2020-05-20: 10 mg via INTRAVENOUS
  Administered 2020-05-20: 20 mg via INTRAVENOUS
  Administered 2020-05-20: 60 mg via INTRAVENOUS

## 2020-05-20 MED ORDER — ROCURONIUM BROMIDE 10 MG/ML (PF) SYRINGE
PREFILLED_SYRINGE | INTRAVENOUS | Status: AC
  Filled 2020-05-20: qty 10

## 2020-05-20 MED ORDER — ONDANSETRON HCL 4 MG/2ML IJ SOLN
INTRAMUSCULAR | Status: AC
  Filled 2020-05-20: qty 2

## 2020-05-20 MED ORDER — ACETAMINOPHEN 10 MG/ML IV SOLN: 1000.0000 mg | Freq: Once | INTRAVENOUS | Status: DC | PRN

## 2020-05-20 MED ORDER — OXYCODONE HCL 5 MG PO TABS
ORAL_TABLET | ORAL | Status: AC
  Filled 2020-05-20: qty 1

## 2020-05-20 MED ORDER — LACTATED RINGERS IR SOLN
Status: DC | PRN
  Administered 2020-05-20: 1000 mL

## 2020-05-20 MED ORDER — OXYCODONE HCL 5 MG/5ML PO SOLN: 5.0000 mg | Freq: Once | ORAL | Status: AC | PRN

## 2020-05-20 MED ORDER — PROPOFOL 10 MG/ML IV BOLUS
INTRAVENOUS | Status: DC | PRN
  Administered 2020-05-20: 140 mg via INTRAVENOUS

## 2020-05-20 SURGICAL SUPPLY — 43 items
ADH SKN CLS APL DERMABOND .7 (GAUZE/BANDAGES/DRESSINGS) ×1
BLADE SURG 15 STRL LF DISP TIS (BLADE) ×1 IMPLANT
BLADE SURG 15 STRL SS (BLADE) ×3
COVER WAND RF STERILE (DRAPES) IMPLANT
DECANTER SPIKE VIAL GLASS SM (MISCELLANEOUS) ×4 IMPLANT
DERMABOND ADVANCED (GAUZE/BANDAGES/DRESSINGS) ×2
DERMABOND ADVANCED .7 DNX12 (GAUZE/BANDAGES/DRESSINGS) IMPLANT
DISSECTOR BLUNT TIP ENDO 5MM (MISCELLANEOUS) IMPLANT
DRAPE UTILITY XL STRL (DRAPES) ×4 IMPLANT
ELECT L-HOOK LAP 45CM DISP (ELECTROSURGICAL) ×3
ELECT REM PT RETURN 15FT ADLT (MISCELLANEOUS) ×3 IMPLANT
ELECTRODE L-HOOK LAP 45CM DISP (ELECTROSURGICAL) IMPLANT
GLOVE BIOGEL M 8.0 STRL (GLOVE) ×3 IMPLANT
GLOVE BIOGEL PI IND STRL 7.0 (GLOVE) ×1 IMPLANT
GLOVE BIOGEL PI INDICATOR 7.0 (GLOVE) ×2
GOWN STRL REUS W/TWL XL LVL3 (GOWN DISPOSABLE) ×9 IMPLANT
GRASPER SUT TROCAR 14GX15 (MISCELLANEOUS) ×2 IMPLANT
KIT BASIN OR (CUSTOM PROCEDURE TRAY) ×3 IMPLANT
KIT TURNOVER KIT A (KITS) ×2 IMPLANT
MAT PREVALON FULL STRYKER (MISCELLANEOUS) ×3 IMPLANT
NDL SPNL 22GX3.5 QUINCKE BK (NEEDLE) ×1 IMPLANT
NEEDLE SPNL 22GX3.5 QUINCKE BK (NEEDLE) ×3 IMPLANT
PACK UNIVERSAL I (CUSTOM PROCEDURE TRAY) ×3 IMPLANT
PENCIL SMOKE EVACUATOR (MISCELLANEOUS) IMPLANT
SCISSORS LAP 5X45 EPIX DISP (ENDOMECHANICALS) ×3 IMPLANT
SET IRRIG TUBING LAPAROSCOPIC (IRRIGATION / IRRIGATOR) ×2 IMPLANT
SET TUBE SMOKE EVAC HIGH FLOW (TUBING) ×3 IMPLANT
SHEARS HARMONIC ACE PLUS 45CM (MISCELLANEOUS) IMPLANT
SLEEVE ADV FIXATION 5X100MM (TROCAR) ×5 IMPLANT
SOL ANTI FOG 6CC (MISCELLANEOUS) ×1 IMPLANT
SOLUTION ANTI FOG 6CC (MISCELLANEOUS) ×2
SPONGE LAP 18X18 RF (DISPOSABLE) ×3 IMPLANT
STAPLER VISISTAT 35W (STAPLE) ×3 IMPLANT
SUT VIC AB 2-0 SH 27 (SUTURE)
SUT VIC AB 2-0 SH 27X BRD (SUTURE) IMPLANT
SUT VIC AB 4-0 SH 18 (SUTURE) ×3 IMPLANT
SUT VICRYL 0 UR6 27IN ABS (SUTURE) ×2 IMPLANT
SYR 20ML LL LF (SYRINGE) ×3 IMPLANT
TOWEL OR 17X26 10 PK STRL BLUE (TOWEL DISPOSABLE) ×4 IMPLANT
TOWEL OR NON WOVEN STRL DISP B (DISPOSABLE) ×3 IMPLANT
TROCAR ADV FIXATION 5X100MM (TROCAR) ×3 IMPLANT
TROCAR BLADELESS 15MM (ENDOMECHANICALS) ×3 IMPLANT
TROCAR BLADELESS OPT 5 100 (ENDOMECHANICALS) ×3 IMPLANT

## 2020-05-20 NOTE — H&P (Signed)
Chief Complaint:  lapband malfunction  History of Present Illness:  [name] [name] is an 74 y.o. male who has had > 10 years with a lapband but recently has had recurrent and chronic obstruction--thought to be a slip and was seen in [place] for this.  He was seen in the office and we discussed removal of the lapband  Past Medical History:  Diagnosis Date  . Aneurysm, ascending aorta (HCC)   . Arthritis   . Coronary artery disease    pt denies  . Difficulty urinating    prostate problem  . GERD (gastroesophageal reflux disease)    HIATAL HERNIA REPAIRED -with lap band 2 years ago NO LONGER HAS GERD  . History of elevated glucose    IN THE PAST - NO PROBLEMS SINCE GASTRIC BANDING WEIGHT LOSS   . History of kidney stones   . Hyperlipidemia   . Hypertension    resolved with lap band 2 years ago  . Pneumonia   . Pulmonary hypertension ([place])    per echo report  pt unaware  . Sleep apnea    uses cpap setting is 12    Past Surgical History:  Procedure Laterality Date  . COLONOSCOPY  07/17/11  . CYSTOSCOPY W/ URETERAL STENT PLACEMENT  07/18/2011   Procedure: CYSTOSCOPY WITH STENT REPLACEMENT;  Surgeon: Alexis Frock;  [facility];  Service: Urology;  Laterality: Left;  Marland Kitchen GASTRIC RESTRICTION SURGERY  12/11/2008   lap band  . HEMORRHOID SURGERY N/A 08/04/2013   Procedure: EUA,HEMORRHOIDECTOMY;  Surgeon: [name], MD;  [facility];  Service: General;  Laterality: N/A;  . LAPAROSCOPIC GASTRIC BANDING  12/11/2008  . LITHOTRIPSY      Current Facility-Administered Medications  Medication Dose Route Frequency Provider Last Rate Last Admin  . bupivacaine liposome (EXPAREL) 1.3 % injection 266 mg  20 mL Infiltration Once Johnathan Hausen, MD      . ceFAZolin (ANCEF) 3 g in dextrose 5 % 50 mL IVPB  3 g Intravenous On Call to OR Johnathan Hausen, MD      . Chlorhexidine Gluconate Cloth 2 % PADS 6 each  6 each Topical Once Johnathan Hausen, MD      . lactated ringers infusion    Intravenous Continuous Duane Boston, MD 10 mL/hr at 05/20/20 0650 New Bag at 05/20/20 0650  . scopolamine (TRANSDERM-SCOP) 1 MG/3DAYS 1.5 mg  1 patch Transdermal On Call to OR Johnathan Hausen, MD   1.5 mg at 05/20/20 0559   Patient has no known allergies. Family History  Problem Relation Age of Onset  . Cancer Mother        ovarian  . Diabetes Mother   . Dementia Mother   . Other Father        MVA  . Healthy Brother    Social History:   reports that he quit smoking about 49 years ago. His smoking use included cigarettes. He has a 16.00 pack-year smoking history. He has quit using smokeless tobacco.  His smokeless tobacco use included snuff. He reports current alcohol use. He reports that he does not use drugs.   REVIEW OF SYSTEMS : Negative except for see problem list  Physical Exam:   Blood pressure 134/74, pulse (!) 55, temperature 98.1 F (36.7 C), temperature source Oral, resp. rate 18, height 6' (1.829 m), weight 123.8 kg, SpO2 99 %. Body mass index is 37.03 kg/m.  Gen:  WDWN WM NAD  Neurological: Alert and oriented to person, place, and time. Motor  and sensory function is grossly intact  Head: Normocephalic and atraumatic.  Eyes: Conjunctivae are normal. Pupils are equal, round, and reactive to light. No scleral icterus.  Neck: Normal range of motion. Neck supple. No tracheal deviation or thyromegaly present.  Cardiovascular:  SR without murmurs or gallops.  No carotid bruits Breast:  Not examined Respiratory: Effort normal.  No respiratory distress. No chest wall tenderness. Breath sounds normal.  No wheezes, rales or rhonchi.  Abdomen:  nontender with palpable port GU:  Not seen Musculoskeletal: Normal range of motion. Extremities are nontender. No cyanosis, edema or clubbing noted Lymphadenopathy: No cervical, preauricular, postauricular or axillary adenopathy is present Skin: Skin is warm and dry. No rash noted. No diaphoresis. No erythema. No pallor. Pscyh: Normal  mood and affect. Behavior is normal. Judgment and thought content normal.   LABORATORY RESULTS: No results found for this or any previous visit (from the past 48 hour(s)).   RADIOLOGY RESULTS: No results found.  Problem List: Patient Active Problem List   Diagnosis Date Noted  . Aortic root dilation ([place]) 07/25/2018  . Aneurysm, ascending aorta ([place]) 07/25/2018  . HTN (hypertension), benign 07/25/2018  . DM2 (diabetes mellitus, type 2) ([place]) 07/25/2018  . Spinal stenosis of lumbar region with neurogenic claudication 07/25/2018  . OSA on CPAP 07/25/2018  . Hemorrhoids 07/26/2013  . Left Ureteral stone 07/18/2011  . Pyelonephritis 07/18/2011  . Anal fissure 06/17/2011  . History of laparoscopic adjustable gastric banding 06/17/2011    Assessment & Plan: lapband malfunction suspect chronic slip    Matt B. Hassell Done, MD, [facility] [phone] beeper [phone]  05/20/2020 7:01 AM

## 2020-05-20 NOTE — Discharge Instructions (Signed)
Advance diet as tolerated

## 2020-05-20 NOTE — Interval H&P Note (Signed)
History and Physical Interval Note:  05/20/2020 7:05 AM  [name] [name]  has presented today for surgery, with the diagnosis of partial obstruction of lapband.  The various methods of treatment have been discussed with the patient and family. After consideration of risks, benefits and other options for treatment, the patient has consented to  Procedure(s): REMOVAL OF LAPBAND (N/A) as a surgical intervention.  The patient's history has been reviewed, patient examined, no change in status, stable for surgery.  I have reviewed the patient's chart and labs.  Questions were answered to the patient's satisfaction.     [name]

## 2020-05-20 NOTE — Anesthesia Postprocedure Evaluation (Signed)
Anesthesia Post Note  Patient: [name] [name]  Procedure(s) Performed: LAPAROSCOPIC REMOVAL OF LAPBAND (N/A Abdomen)     Patient location during evaluation: PACU Anesthesia Type: General Level of consciousness: awake and alert Pain management: pain level controlled Vital Signs Assessment: post-procedure vital signs reviewed and stable Respiratory status: spontaneous breathing, nonlabored ventilation, respiratory function stable and patient connected to nasal cannula oxygen Cardiovascular status: blood pressure returned to baseline and stable Postop Assessment: no apparent nausea or vomiting Anesthetic complications: no   No complications documented.  Last Vitals:  Vitals:   05/20/20 1015 05/20/20 1045  BP: 123/63 (!) 153/89  Pulse: (!) 50 62  Resp: 17 14  Temp:    SpO2: 97% 95%    Last Pain:  Vitals:   05/20/20 1045  TempSrc:   PainSc: [place] Yeilin Zweber

## 2020-05-20 NOTE — Op Note (Signed)
[name] [name]  1946-05-14   05/20/2020    PCP:  Deland Pretty, MD   Surgeon: Kaylyn Lim, MD, FACS  Asst:  Gurney Maxin, MD, FACS  Anes:  general  Preop Dx: Anterior slip Lapband Postop Dx: same  Procedure: Removal of Lapband and take down of plication Location Surgery: [facility] Complications: None noted  EBL:   10 cc  Drains: none  Description of Procedure:  The patient was taken to OR 5 .  After anesthesia was administered and the patient was prepped  with [employer]  and a timeout was performed.  The abdomen was entered through the left upper quadrant with a 5 mm Optiview.  5 mm trocars were placed and the right lateral was converted to a 15 mm trocar.  The hook cautery was used to burn down on the buckle and release the buckle.  The tubing was cut and the band was removed.  The plication was taken down with scissors and the cicatrix was divided as well.  No evidence of enterotomy.  A lateral anterior slip was relieved and allowed to return to normal.     TAP block with 30 cc of Exparel was performed at the beginning the case.  The 15 mm port was closed with a single 0 vicryl with the PMI.  The skin was closed with 4-0 vicryl and Monocryl.    The patient tolerated the procedure well and was taken to the PACU in stable condition.     Matt B. Hassell Done, [place], [facility], [place]

## 2020-05-20 NOTE — Transfer of Care (Signed)
Immediate Anesthesia Transfer of Care Note  Patient: [name] [name]  Procedure(s) Performed: LAPAROSCOPIC REMOVAL OF LAPBAND (N/A Abdomen)  Patient Location: PACU  Anesthesia Type:General  Level of Consciousness: awake, alert  and oriented  Airway & Oxygen Therapy: Patient Spontanous Breathing and Patient connected to face mask oxygen  Post-op Assessment: Report given to RN, Post -op Vital signs reviewed and stable and Patient moving all extremities X 4  Post vital signs: Reviewed and stable  Last Vitals:  Vitals Value Taken Time  BP    Temp    Pulse 57 05/20/20 0911  Resp 21 05/20/20 0911  SpO2 99 % 05/20/20 0911  Vitals shown include unvalidated device data.  Last Pain:  Vitals:   05/20/20 0558  TempSrc:   PainSc: 0-No pain         Complications: No complications documented.

## 2020-05-20 NOTE — Anesthesia Procedure Notes (Signed)
Procedure Name: Intubation Date/Time: 05/20/2020 7:34 AM Performed by: Niel Hummer, CRNA Pre-anesthesia Checklist: Patient identified, Emergency Drugs available, Suction available and Patient being monitored Patient Re-evaluated:Patient Re-evaluated prior to induction Oxygen Delivery Method: Circle system utilized Preoxygenation: Pre-oxygenation with 100% oxygen Induction Type: IV induction Ventilation: Mask ventilation without difficulty and Oral airway inserted - appropriate to patient size Laryngoscope Size: Mac and 4 Grade View: Grade II Tube type: Oral Tube size: 7.5 mm Number of attempts: 1 Airway Equipment and Method: Stylet Placement Confirmation: ETT inserted through vocal cords under direct vision,  positive ETCO2 and breath sounds checked- equal and bilateral Secured at: 22 cm Tube secured with: Tape Dental Injury: Teeth and Oropharynx as per pre-operative assessment

## 2020-06-04 DIAGNOSIS — I10 Essential (primary) hypertension: Secondary | ICD-10-CM | POA: Diagnosis not present

## 2020-06-05 ENCOUNTER — Other Ambulatory Visit: Payer: Self-pay | Admitting: Cardiology

## 2020-06-05 DIAGNOSIS — I77819 Aortic ectasia, unspecified site: Secondary | ICD-10-CM

## 2020-06-05 DIAGNOSIS — I10 Essential (primary) hypertension: Secondary | ICD-10-CM

## 2020-07-04 ENCOUNTER — Other Ambulatory Visit: Payer: Self-pay | Admitting: Cardiology

## 2020-07-04 DIAGNOSIS — I77819 Aortic ectasia, unspecified site: Secondary | ICD-10-CM

## 2020-07-04 DIAGNOSIS — I10 Essential (primary) hypertension: Secondary | ICD-10-CM

## 2020-07-05 DIAGNOSIS — I10 Essential (primary) hypertension: Secondary | ICD-10-CM | POA: Diagnosis not present

## 2020-07-23 ENCOUNTER — Other Ambulatory Visit: Payer: Self-pay | Admitting: Cardiology

## 2020-07-23 DIAGNOSIS — I10 Essential (primary) hypertension: Secondary | ICD-10-CM

## 2020-08-02 ENCOUNTER — Other Ambulatory Visit: Payer: Self-pay | Admitting: Cardiology

## 2020-08-02 DIAGNOSIS — I77819 Aortic ectasia, unspecified site: Secondary | ICD-10-CM

## 2020-08-02 DIAGNOSIS — I10 Essential (primary) hypertension: Secondary | ICD-10-CM

## 2020-08-05 DIAGNOSIS — I10 Essential (primary) hypertension: Secondary | ICD-10-CM | POA: Diagnosis not present

## 2020-08-13 DIAGNOSIS — E7800 Pure hypercholesterolemia, unspecified: Secondary | ICD-10-CM | POA: Diagnosis not present

## 2020-08-13 DIAGNOSIS — E118 Type 2 diabetes mellitus with unspecified complications: Secondary | ICD-10-CM | POA: Diagnosis not present

## 2020-08-20 DIAGNOSIS — I10 Essential (primary) hypertension: Secondary | ICD-10-CM | POA: Diagnosis not present

## 2020-08-20 DIAGNOSIS — E118 Type 2 diabetes mellitus with unspecified complications: Secondary | ICD-10-CM | POA: Diagnosis not present

## 2020-09-03 ENCOUNTER — Other Ambulatory Visit: Payer: Self-pay | Admitting: Cardiology

## 2020-09-03 DIAGNOSIS — I77819 Aortic ectasia, unspecified site: Secondary | ICD-10-CM

## 2020-09-03 DIAGNOSIS — I10 Essential (primary) hypertension: Secondary | ICD-10-CM

## 2020-09-04 NOTE — Telephone Encounter (Signed)
From pharmacy

## 2020-09-04 NOTE — Telephone Encounter (Signed)
ICD-10-CM   1. Aortic dilatation (HCC)  I77.819 irbesartan (AVAPRO) 75 MG tablet  2. Essential hypertension  I10 irbesartan (AVAPRO) 75 MG tablet   Medications Discontinued During This Encounter  Medication Reason  . losartan (COZAAR) 25 MG tablet Reorder    Meds ordered this encounter  Medications  . irbesartan (AVAPRO) 75 MG tablet    Sig: Take 1 tablet (75 mg total) by mouth daily.    Dispense:  90 tablet    Refill:  1   Meds discontinued due to shortage in supply.

## 2020-09-05 DIAGNOSIS — I10 Essential (primary) hypertension: Secondary | ICD-10-CM | POA: Diagnosis not present

## 2020-09-09 ENCOUNTER — Ambulatory Visit: Payer: Medicare Other | Admitting: Cardiology

## 2020-09-09 ENCOUNTER — Other Ambulatory Visit: Payer: Self-pay

## 2020-09-09 ENCOUNTER — Encounter: Payer: Self-pay | Admitting: Cardiology

## 2020-09-09 VITALS — BP 134/84 | HR 75 | Temp 98.8°F | Resp 16 | Ht 72.0 in | Wt 303.0 lb

## 2020-09-09 DIAGNOSIS — I10 Essential (primary) hypertension: Secondary | ICD-10-CM

## 2020-09-09 DIAGNOSIS — I2510 Atherosclerotic heart disease of native coronary artery without angina pectoris: Secondary | ICD-10-CM | POA: Diagnosis not present

## 2020-09-09 DIAGNOSIS — Z87891 Personal history of nicotine dependence: Secondary | ICD-10-CM

## 2020-09-09 DIAGNOSIS — I2584 Coronary atherosclerosis due to calcified coronary lesion: Secondary | ICD-10-CM | POA: Diagnosis not present

## 2020-09-09 DIAGNOSIS — I77819 Aortic ectasia, unspecified site: Secondary | ICD-10-CM

## 2020-09-09 DIAGNOSIS — I340 Nonrheumatic mitral (valve) insufficiency: Secondary | ICD-10-CM

## 2020-09-09 DIAGNOSIS — I700 Atherosclerosis of aorta: Secondary | ICD-10-CM | POA: Diagnosis not present

## 2020-09-09 MED ORDER — SPIRONOLACTONE 25 MG PO TABS: 12.5000 mg | ORAL_TABLET | Freq: Every morning | ORAL | 0 refills | Status: DC

## 2020-09-09 MED ORDER — CARVEDILOL 3.125 MG PO TABS: 3.1250 mg | ORAL_TABLET | Freq: Two times a day (BID) | ORAL | 0 refills | Status: DC

## 2020-09-09 NOTE — Progress Notes (Signed)
Primary Physician:  Deland Pretty, MD   Patient ID: [name] [name], male    DOB: Feb 05, 1946, 75 y.o.   MRN: [id number]   Date: 09/09/20 Last Office Visit: 03/12/2020  Subjective:    Chief Complaint  Patient presents with  . Hypertension  . Aorta dilatation   . Follow-up    HPI: [name] [name]  is a 75 y.o. male  with hypertension, lumbar spinal stenosis, obstructive sleep apnea on CPAP, aortic root dilation, advanced age, coronary artery calcification, former smoker, and obesity due to excess calories s/p gastric banding surgery in 2010 presents to the office with a chief complaint of " 18-monthfollow-up for blood pressure management."  He  is accompanied by his wife at today's office visit.  He provides verbal consent in regards to discussing his medical information in their presence.  Patient presents today for his 623-monthollow-up for management of hypertension and aortic root dilatation.  Patient is currently enrolled into principal care management for ambulatory blood pressure monitoring.  Blood pressure log reviewed average blood pressure is 138/82 with a pulse of 57 bpm.  Recent labs independently reviewed from care everywhere that were performed at his PCPs office on 08/13/2020.  Serum creatinine 1 mg/dL, GFR 77, potassium 5.1.  At the last office visit patient was informed to discuss the noncardiac findings on the CTA of the chest.  However his wife states that this has not happened.  I explained the noncardiac findings to the best my abilities but requested that they follow-up with PCP and if needed pulmonary medicine for further evaluation given his history of smoking.  Based on the last evaluation patient was noted to have ascending aortic diameter of 4.4 cm which has remained stable.  His losartan was transitioned to  irbesartan for reasons unknown.  But he is tolerating the medication well.  Past Medical History:  Diagnosis Date  . Aneurysm, ascending aorta (HCC)   .  Arthritis   . Coronary artery disease    pt denies  . Difficulty urinating    prostate problem  . GERD (gastroesophageal reflux disease)    HIATAL HERNIA REPAIRED -with lap band 2 years ago NO LONGER HAS GERD  . History of elevated glucose    IN THE PAST - NO PROBLEMS SINCE GASTRIC BANDING WEIGHT LOSS   . History of kidney stones   . Hyperlipidemia   . Hypertension    resolved with lap band 2 years ago  . Pneumonia   . Pulmonary hypertension (HCPine K[place]   per echo report  pt unaware  . Sleep apnea    uses cpap setting is 12    Past Surgical History:  Procedure Laterality Date  . COLONOSCOPY  07/17/11  . CYSTOSCOPY W/ URETERAL STENT PLACEMENT  07/18/2011   Procedure: CYSTOSCOPY WITH STENT REPLACEMENT;  Surgeon: ThAlexis Frock Location: [facility];  Service: Urology;  Laterality: Left;  . Marland KitchenASTRIC RESTRICTION SURGERY  12/11/2008   lap band  . HEMORRHOID SURGERY N/A 08/04/2013   Procedure: EUA,HEMORRHOIDECTOMY;  Surgeon: MaPedro EarlsMD;  Location: [facility];  Service: General;  Laterality: N/A;  . LAPAROSCOPIC GASTRIC BANDING  12/11/2008  . LITHOTRIPSY      Social History   Tobacco Use  . Smoking status: Former Smoker    Packs/day: 2.00    Years: 8.00    Pack years: 16.00    Types: Cigarettes    Quit date: 04/05/1971    Years since quitting: 49.4  . Smokeless tobacco:  Former Systems developer    Types: Snuff  Vaping Use  . Vaping Use: Never used  Substance Use Topics  . Alcohol use: Yes    Alcohol/week: 0.0 standard drinks    Comment: occasional beer  . Drug use: Never     Review of Systems  Constitutional: Negative for decreased appetite, malaise/fatigue, weight gain and weight loss.  Eyes: Negative for visual disturbance.  Cardiovascular: Positive for dyspnea on exertion (improved). Negative for chest pain, claudication, leg swelling (end of the day; improved with support stockings), orthopnea, palpitations and syncope.  Respiratory: Negative for hemoptysis and wheezing.   Endocrine:  Negative for cold intolerance and heat intolerance.  Hematologic/Lymphatic: Does not bruise/bleed easily.  Skin: Negative for nail changes.  Musculoskeletal: Negative for muscle weakness and myalgias.  Gastrointestinal: Negative for abdominal pain, change in bowel habit, nausea and vomiting.  Neurological: Negative for difficulty with concentration, dizziness, focal weakness and headaches.  Psychiatric/Behavioral: Negative for altered mental status and suicidal ideas.  All other systems reviewed and are negative.     Objective:  Blood pressure 134/84, pulse 75, temperature 98.8 F (37.1 C), temperature source Temporal, resp. rate 16, height 6' (1.829 m), weight (!) 303 lb (137.4 kg), SpO2 95 %. Body mass index is 41.09 kg/m.    Physical Exam Vitals reviewed.  Constitutional:      General: He is not in acute distress.    Appearance: He is well-developed.  HENT:     Head: Normocephalic and atraumatic.  Cardiovascular:     Rate and Rhythm: Normal rate and regular rhythm.     Pulses: Intact distal pulses.          Femoral pulses are 2+ on the right side and 2+ on the left side.      Popliteal pulses are 2+ on the right side and 2+ on the left side.       Dorsalis pedis pulses are 1+ on the right side and 1+ on the left side.       Posterior tibial pulses are 2+ on the right side and 2+ on the left side.     Heart sounds: Normal heart sounds.  Pulmonary:     Effort: Pulmonary effort is normal. No accessory muscle usage or respiratory distress.     Breath sounds: Normal breath sounds.  Abdominal:     General: Bowel sounds are normal.     Palpations: Abdomen is soft.  Musculoskeletal:        General: Normal range of motion.     Cervical back: Normal range of motion.  Skin:    General: Skin is warm and dry.  Neurological:     Mental Status: He is alert and oriented to person, place, and time.  Psychiatric:        Behavior: Behavior normal.    Radiology:  CTA of chest  12/07/2018:  1. The tubular ascending thoracic aorta is enlarged, measuring 4.5 x  4.4 cm. The aortic valve measures 2.6 cm. The sinuses of Valsalva measure 4.7 cm. The descending thoracic aorta is slightly enlarged measuring 3.2 x 3.2 cm. Prior examination dated 2009 is significantly limited by motion artifact and lack of intravenous contrast, however these measurements appear to be minimally increased, approximately 4.4 x 4.1 cm in the tubular ascending thoracic aorta and 3.2 x 3.0 cm in the descending aorta. The sinuses of Valsalva and aortic root cannot be adequately evaluated on prior examination. 2.  Coronary artery disease.  CTA chest with contrast 01/29/2020 Stable  mild aneurysmal dilatation of the ascending thoracic aorta, maximal diameter 4.4 cm. No significant interval change.  Negative for acute pulmonary embolus  No other acute intrathoracic finding  Native coronary atherosclerosis  Cholelithiasis  Aortic Atherosclerosis (ICD10-I70.0).  Lungs/Pleura: No focal pneumonia, collapse or consolidation. No acute airspace process or interstitial disease. There are a few scattered subcentimeter punctate calcified granulomas in the lingula, superior segment right lower lobe, and left lower lobe. Stable benign 5 mm subpleural left lower lobe nodule, image 104 series 7. No new or enlarging nodule.  Laboratory examination:   External Labs: Collected: 08/2019 Creatinine 1.0 mg/dL. eGFR: 77 mL/min per 1.73 m Potassium 4.9 Lipid profile: Total cholesterol 103, triglycerides 100, HDL 33 LDL 50, non-HDL 70 Hemoglobin A1c: 6.0  External Labs: Collected: 02/13/2020 Hemoglobin 12.3 g/dL Creatinine 0.9 mg/dL. eGFR: 87 mL/min per 1.73 m Potassium 5.6 Lipid profile: Total cholesterol 95, triglycerides 90, HDL 36, LDL 41, non-HDL 59. Hemoglobin A1c: 6.3  External labs: Collected: 08/13/2020 at his PCPs office. Sodium 141, potassium 5.1, BUN 16, creatinine 1, EGFR 77, hemoglobin A1c 6.0 Total  cholesterol 103, triglycerides 94, HDL 34, LDL 51.  Medications Discontinued During This Encounter  Medication Reason  . HYDROcodone-acetaminophen (NORCO/VICODIN) 5-325 MG tablet Error  . ondansetron (ZOFRAN) 4 MG tablet Error  . Methylsulfonylmethane (MSM) 1000 MG CAPS Error  . Prasterone, DHEA, (DHEA 50) 50 MG CAPS Error  . spironolactone (ALDACTONE) 25 MG tablet Reorder   Current Meds  Medication Sig  . acetaminophen (TYLENOL) 500 MG tablet Take 1,000 mg by mouth every 6 (six) hours as needed for moderate pain.  Marland Kitchen alfuzosin (UROXATRAL) 10 MG 24 hr tablet Take 10 mg by mouth daily.  Marland Kitchen amLODipine (NORVASC) 10 MG tablet Take 10 mg by mouth daily.  Marland Kitchen aspirin EC 81 MG tablet Take 81 mg by mouth daily.  . carvedilol (COREG) 3.125 MG tablet Take 1 tablet (3.125 mg total) by mouth 2 (two) times daily with a meal.  . Cholecalciferol (VITAMIN D3) 125 MCG (5000 UT) TABS Take 5,000 Units by mouth daily.   . diphenhydrAMINE HCl, Sleep, (ZZZQUIL) 25 MG CAPS Take 50 mg by mouth at bedtime.  . docusate sodium (COLACE) 100 MG capsule Take 200 mg by mouth daily as needed for mild constipation.  . gabapentin (NEURONTIN) 100 MG capsule Take 200 mg by mouth 2 (two) times a day.   . irbesartan (AVAPRO) 75 MG tablet Take 1 tablet (75 mg total) by mouth daily.  . Multiple Vitamin (MULTIVITAMIN) tablet Take 1 tablet by mouth daily.  . pantoprazole (PROTONIX) 20 MG tablet Take 20 mg by mouth daily.   . rosuvastatin (CRESTOR) 10 MG tablet Take 10 mg by mouth daily.  . TURMERIC-GINGER PO Take 1 tablet by mouth daily.  . Zinc 50 MG TABS Take 50 mg by mouth daily.   . [DISCONTINUED] spironolactone (ALDACTONE) 25 MG tablet TAKE 1 TABLET (25 MG TOTAL) BY MOUTH IN THE MORNING.    Cardiac Studies:  EKG: 09/09/2020: Normal sinus rhythm, 64 bpm, without underlying injury pattern.    Echocardiogram 08/31/2018:  Left ventricle cavity is normal in size. Moderate concentric hypertrophy of the left ventricle. Normal  global wall motion. Calculated EF 55%. Left atrial cavity is severely dilated. Moderate (Grade II) mitral regurgitation. Moderate tricuspid regurgitation. Moderate pulmonary hypertension. Estimated pulmonary artery systolic pressure 50 mmHg. The aortic root is dilated measuring 4.5 cm. Compared to previous study on 08/31/2017, there is mild increase in aortic root diameter and severity of pulmonary hypertension.  Exercise myoview stress test  05/01/2019: Patient exercised on Bruce protocol for 5:00  minutes and achieved  99 % of Max Predicted HR (Target HR was >85% MPHR) and  6.79 METS. Stress symptoms included fatigue and dyspnea. Normal BP response.  Exercise capacity was low. The perfusion imaging study demonstrates mild diaphragmatic attenuation artifact and normal perfusion in all vascular territories. Dynamic gated images reveal normal endocardial thickening and left ventricular systolic function. LVEF calculated was   45% but visually appeared to be normal. This is a low risk study.  US Carotid Bilateral Cone 05/01/2015: Normal. Negative for stenosis.   ABI 09/29/2019: This exam reveals normal perfusion of the right lower extremity (ABI 1.19 ) and normal perfusion of the left lower extremity (ABI 1.26).   Sleep Study: Positive for Sleep Apnea; Uses CPAP  Assessment:     ICD-10-CM   1. Essential hypertension  I10 EKG 12-Lead    ECHOCARDIOGRAM COMPLETE    carvedilol (COREG) 3.125 MG tablet    spironolactone (ALDACTONE) 25 MG tablet  2. Aortic dilatation (HCC)  I77.819 ECHOCARDIOGRAM COMPLETE  3. Nonrheumatic mitral valve regurgitation  I34.0 ECHOCARDIOGRAM COMPLETE  4. Coronary atherosclerosis due to calcified coronary lesion of native artery  I25.10    I25.84   5. Atherosclerosis of aorta (HCC)  I70.0   6. Former smoker  Z87.891    Recommendations:  Aortic dilatation:  Based on the most recent CT from July 2020 1 aortic root dilatation remains stable at approximately 4.4 cm.     Since last office visit patient has gained approximately 10 pounds and his average blood pressure over the last 2 weeks is 138/82.    Blood pressure medications changed and noted below for further reference.  He is educated on the importance of increasing physical activity as tolerated to 30 minutes a day 5 days a week to facilitate weight loss, compliance with blood pressure medications, and ideally if able to tolerate a systolic blood pressure of 120 mmHg to prevent further dilatation of the ascending aorta.    Monitor for now  We will order an echocardiogram for further surveillance as the measurements between the CT and echo to correlate.  Benign essential hypertension: . Office blood pressure is acceptable. . Patient enrolled into principal care management for ambulatory blood pressure monitoring. BP log reviewed.  . Medication reconciled.  . Change spironolactone to 12.5 mg p.o. daily given his potassium levels.  . Add carvedilol 3.125 mg p.o. twice daily. . Patient is asked to keep a log of his blood pressures and if he does not hear back from the office to give [place] a call back in a month to see how his blood pressures are currently doing. . Low salt diet recommended. A diet that is rich in fruits, vegetables, legumes, and low-fat dairy products and low in snacks, sweets, and meats (such as the Dietary Approaches to Stop Hypertension [DASH] diet).   Coronary artery calcification noted on nongated CT:  Recommend aspirin and statin therapy.  Most recent echocardiogram and stress test 04/2019 reviewed and findings noted above.  Clinically patient denies any chest pain at rest or with effort related activities.  His dyspnea on exertion is stable. Continue to monitor.  Obesity, due to excess calories: Body mass index is 41.09 kg/m.  Patient has gained approximately 10 pounds since last office visit.  I reviewed with the patient the importance of diet, regular physical  activity/exercise, weight loss.    Patient is educated on increasing physical activity  gradually as tolerated.  With the goal of moderate intensity exercise for 30 minutes a day 5 days a week.  Former smoker: Educated on the importance of continued smoking cessation.  CT of the chest that was performed in July 2021 noted noncardiac findings.  During her last office visit he was encouraged to follow-up with PCP to review these findings and to see if a pulmonary consultation is warranted.  However, patient failed to do so.  I have reencouraged this to the patient's wife at today's office visit as well.  Orders Placed This Encounter  Procedures  . EKG 12-Lead  . ECHOCARDIOGRAM COMPLETE   --Continue cardiac medications as reconciled in final medication list. --Return in about 6 months (around 03/12/2021) for Follow up, BP, aortic root dilatation, valvular heart disease.. Or sooner if needed. --Continue follow-up with your primary care physician regarding the management of your other chronic comorbid conditions.  Patient's questions and concerns were addressed to his satisfaction. He voices understanding of the instructions provided during this encounter.   This note was created using a voice recognition software as a result there may be grammatical errors inadvertently enclosed that do not reflect the nature of this encounter. Every attempt is made to correct such errors.  Rex Kras, [place], [facility]  Pager: [phone] Office: [phone]

## 2020-09-10 NOTE — Progress Notes (Signed)
External Labs: Collected: 08/13/2020 provided by PCP. Creatinine 1 mg/dL. eGFR: 77 mL/min per 1.73 m Sodium 141, potassium 5.9, chloride 103, bicarb 26 AST 18, ALT 23, Lipid profile: Total cholesterol 103, triglycerides 94, HDL 34, non-HDL 51 Hemoglobin A1c: 6

## 2020-09-12 ENCOUNTER — Other Ambulatory Visit: Payer: Self-pay

## 2020-09-12 DIAGNOSIS — I10 Essential (primary) hypertension: Secondary | ICD-10-CM

## 2020-09-19 DIAGNOSIS — I10 Essential (primary) hypertension: Secondary | ICD-10-CM | POA: Diagnosis not present

## 2020-09-19 DIAGNOSIS — E118 Type 2 diabetes mellitus with unspecified complications: Secondary | ICD-10-CM | POA: Diagnosis not present

## 2020-09-19 DIAGNOSIS — E7800 Pure hypercholesterolemia, unspecified: Secondary | ICD-10-CM | POA: Diagnosis not present

## 2020-09-26 ENCOUNTER — Ambulatory Visit (HOSPITAL_COMMUNITY)
Admission: RE | Admit: 2020-09-26 | Discharge: 2020-09-26 | Disposition: A | Discharge status: Home / Self Care | Payer: Medicare Other | Source: Ambulatory Visit | Attending: Cardiology | Admitting: Cardiology

## 2020-09-26 ENCOUNTER — Other Ambulatory Visit: Payer: Self-pay

## 2020-09-26 DIAGNOSIS — I340 Nonrheumatic mitral (valve) insufficiency: Secondary | ICD-10-CM | POA: Insufficient documentation

## 2020-09-26 DIAGNOSIS — I081 Rheumatic disorders of both mitral and tricuspid valves: Secondary | ICD-10-CM | POA: Insufficient documentation

## 2020-09-26 DIAGNOSIS — I10 Essential (primary) hypertension: Secondary | ICD-10-CM | POA: Diagnosis not present

## 2020-09-26 DIAGNOSIS — I77819 Aortic ectasia, unspecified site: Secondary | ICD-10-CM | POA: Diagnosis not present

## 2020-09-26 DIAGNOSIS — I2510 Atherosclerotic heart disease of native coronary artery without angina pectoris: Secondary | ICD-10-CM | POA: Diagnosis not present

## 2020-09-26 DIAGNOSIS — E785 Hyperlipidemia, unspecified: Secondary | ICD-10-CM | POA: Diagnosis not present

## 2020-09-26 LAB — ECHOCARDIOGRAM COMPLETE
Area-P 1/2: 3.17 cm2
Calc EF: 61.7 %
S' Lateral: 3.5 cm
Single Plane A2C EF: 66.8 %
Single Plane A4C EF: 59.9 %

## 2020-10-05 DIAGNOSIS — I10 Essential (primary) hypertension: Secondary | ICD-10-CM | POA: Diagnosis not present

## 2020-10-17 DIAGNOSIS — H43813 Vitreous degeneration, bilateral: Secondary | ICD-10-CM | POA: Diagnosis not present

## 2020-10-17 DIAGNOSIS — H35372 Puckering of macula, left eye: Secondary | ICD-10-CM | POA: Diagnosis not present

## 2020-10-17 DIAGNOSIS — H40013 Open angle with borderline findings, low risk, bilateral: Secondary | ICD-10-CM | POA: Diagnosis not present

## 2020-10-17 DIAGNOSIS — H2513 Age-related nuclear cataract, bilateral: Secondary | ICD-10-CM | POA: Diagnosis not present

## 2020-10-23 ENCOUNTER — Other Ambulatory Visit: Payer: Self-pay | Admitting: Cardiology

## 2020-10-23 DIAGNOSIS — I10 Essential (primary) hypertension: Secondary | ICD-10-CM

## 2020-11-04 DIAGNOSIS — I10 Essential (primary) hypertension: Secondary | ICD-10-CM | POA: Diagnosis not present

## 2020-11-13 DIAGNOSIS — R972 Elevated prostate specific antigen [PSA]: Secondary | ICD-10-CM | POA: Diagnosis not present

## 2020-11-20 DIAGNOSIS — R3915 Urgency of urination: Secondary | ICD-10-CM | POA: Diagnosis not present

## 2020-11-20 DIAGNOSIS — R350 Frequency of micturition: Secondary | ICD-10-CM | POA: Diagnosis not present

## 2020-11-20 DIAGNOSIS — R972 Elevated prostate specific antigen [PSA]: Secondary | ICD-10-CM | POA: Diagnosis not present

## 2020-11-20 DIAGNOSIS — N5201 Erectile dysfunction due to arterial insufficiency: Secondary | ICD-10-CM | POA: Diagnosis not present

## 2020-11-20 DIAGNOSIS — N401 Enlarged prostate with lower urinary tract symptoms: Secondary | ICD-10-CM | POA: Diagnosis not present

## 2020-11-30 ENCOUNTER — Other Ambulatory Visit: Payer: Self-pay | Admitting: Cardiology

## 2020-11-30 DIAGNOSIS — I10 Essential (primary) hypertension: Secondary | ICD-10-CM

## 2020-12-04 ENCOUNTER — Encounter: Payer: Self-pay | Admitting: Pulmonary Disease

## 2020-12-04 ENCOUNTER — Ambulatory Visit (INDEPENDENT_AMBULATORY_CARE_PROVIDER_SITE_OTHER): Payer: Medicare Other | Admitting: Pulmonary Disease

## 2020-12-04 ENCOUNTER — Other Ambulatory Visit: Payer: Self-pay

## 2020-12-04 VITALS — BP 124/62 | HR 68 | Temp 98.5°F | Ht 72.0 in | Wt 303.0 lb

## 2020-12-04 DIAGNOSIS — R911 Solitary pulmonary nodule: Secondary | ICD-10-CM | POA: Diagnosis not present

## 2020-12-04 DIAGNOSIS — I10 Essential (primary) hypertension: Secondary | ICD-10-CM | POA: Diagnosis not present

## 2020-12-04 NOTE — Patient Instructions (Signed)
Nice to meet you  Spots in the lung looks stable to me from 2020 to 2021.  Several of the spots are stable and can be seen on the CT scan of the abdomen from 2013.  Given the stability, these are benign and I do not think you need any further follow-up at this point time.  Please let me know if I can help in any way.  If at any point in the future scans show changes I am happy to evaluate.  If you develop any new symptoms that you think I can help with, please send me a message - I am happy to help.  Return to clinic as needed

## 2020-12-11 NOTE — Progress Notes (Signed)
@Patient  ID: [name] [name], male    DOB: Oct 11, 1945, 75 y.o.   MRN: [id number]  Chief Complaint  Patient presents with  . Consult    Referred by cardiologist for abnormal CT scan. Per patient he has not had any increased SOB. Very rarely will have a cough. Also denies any chest pain.     Referring provider: Deland Pretty, MD  HPI:   75 year old whom we are seen in consultation for evaluation of abnormal CT images.  Most recent cardiology note x2 reviewed.  PCP note reviewed.  Patient here at the request of his cardiologist.  Notes on serial images for following aneurysm, that have been pulmonary nodules.  CT chest 12/07/2018 and 01/29/2020 reviewed with patient in the room and interpreted as scattered calcified nodules likely reflecting granulomatous disease bilaterally that are stable and larger left lower lobe nodule stable in appearance.  Reviewed CT abdomen pelvis 07/18/2011 that demonstrates similar appearing nodules including the larger left lower lobe nodule appears stable on my interpretation.  He denies any respiratory symptoms.  No shortness of breath or dyspnea on exertion.  Very rare cough but not bothersome.  Denies any GERD symptoms.  No dysphagia.  Overall feels well.  Not limited in any way in terms of dyspnea on exertion.  Does do things he enjoys and wants to do without any limitation.  No concerns today from his point of view.  PMH: Tobacco abuse in remission, thoracic aneurysm Surgical history: Laparoscopic band, hemorrhoid surgery, lithotripsy Family history: Mother with diabetes, ovarian cancer, dementia Social history: Grew up out of [place], retired, lives in [place], former smoker, 60-pack-year history, quit in the [place] / Pulmonary Flowsheets:   ACT:  No flowsheet data found.  MMRC: No flowsheet data found.  Epworth:  No flowsheet data found.  Tests:   FENO:  No results found for: NITRICOXIDE  PFT: No flowsheet data  found.  WALK:  No flowsheet data found.  Imaging: Personally reviewed and as per EMR discussion this note  Lab Results: Personally reviewed CBC    Component Value Date/Time   WBC 6.7 05/16/2020 1038   RBC 3.95 (L) 05/16/2020 1038   HGB 12.9 (L) 05/16/2020 1038   HCT 39.3 05/16/2020 1038   PLT 213 05/16/2020 1038   MCV 99.5 05/16/2020 1038   MCH 32.7 05/16/2020 1038   MCHC 32.8 05/16/2020 1038   RDW 11.7 05/16/2020 1038   LYMPHSABS 0.5 (L) 07/18/2011 1006   MONOABS 0.2 07/18/2011 1006   EOSABS 0.0 07/18/2011 1006   BASOSABS 0.0 07/18/2011 1006    BMET    Component Value Date/Time   NA 142 05/16/2020 1038   NA 141 03/29/2020 0841   K 4.4 05/16/2020 1038   CL 109 05/16/2020 1038   CO2 26 05/16/2020 1038   GLUCOSE 116 (H) 05/16/2020 1038   BUN 14 05/16/2020 1038   BUN 11 03/29/2020 0841   CREATININE 0.65 05/16/2020 1038   CALCIUM 9.2 05/16/2020 1038   GFRNONAA >60 05/16/2020 1038   GFRAA 103 03/29/2020 0841    BNP No results found for: BNP  ProBNP No results found for: PROBNP  Specialty Problems      Pulmonary Problems   OSA on CPAP      No Known Allergies  Immunization History  Administered Date(s) Administered  . Influenza-Unspecified 05/06/2018  . PFIZER(Purple Top)SARS-COV-2 Vaccination 08/20/2019, 09/11/2019    Past Medical History:  Diagnosis Date  . Aneurysm, ascending aorta (HCC)   . Arthritis   .  Coronary artery disease    pt denies  . Difficulty urinating    prostate problem  . GERD (gastroesophageal reflux disease)    HIATAL HERNIA REPAIRED -with lap band 2 years ago NO LONGER HAS GERD  . History of elevated glucose    IN THE PAST - NO PROBLEMS SINCE GASTRIC BANDING WEIGHT LOSS   . History of kidney stones   . Hyperlipidemia   . Hypertension    resolved with lap band 2 years ago  . Pneumonia   . Pulmonary hypertension ([place])    per echo report  pt unaware  . Sleep apnea    uses cpap setting is 12    Tobacco  History: Social History   Tobacco Use  Smoking Status Former Smoker  . Packs/day: 2.00  . Years: 8.00  . Pack years: 16.00  . Types: Cigarettes  . Quit date: 04/05/1971  . Years since quitting: 49.7  Smokeless Tobacco Former Systems developer  . Types: Snuff   Counseling given: Not Answered   Continue to not smoke  Outpatient Encounter Medications as of 12/04/2020  Medication Sig  . acetaminophen (TYLENOL) 500 MG tablet Take 1,000 mg by mouth every 6 (six) hours as needed for moderate pain.  Marland Kitchen alfuzosin (UROXATRAL) 10 MG 24 hr tablet Take 10 mg by mouth daily.  Marland Kitchen amLODipine (NORVASC) 10 MG tablet Take 10 mg by mouth daily.  . Ascorbic Acid (VITAMIN C) 500 MG CAPS See admin instructions.  Marland Kitchen aspirin EC 81 MG tablet Take 81 mg by mouth daily.  . carvedilol (COREG) 3.125 MG tablet TAKE 1 TABLET (3.125 MG TOTAL) BY MOUTH 2 (TWO) TIMES DAILY WITH A MEAL.  Marland Kitchen Cholecalciferol (VITAMIN D3) 125 MCG (5000 UT) TABS Take 5,000 Units by mouth daily.   . clotrimazole-betamethasone (LOTRISONE) cream Apply 1 application topically 2 (two) times daily.  . diphenhydrAMINE HCl, Sleep, (ZZZQUIL) 25 MG CAPS Take 50 mg by mouth at bedtime.  . docusate sodium (COLACE) 100 MG capsule Take 200 mg by mouth daily as needed for mild constipation.  . gabapentin (NEURONTIN) 100 MG capsule Take 200 mg by mouth 2 (two) times a day.   . irbesartan (AVAPRO) 75 MG tablet Take 1 tablet (75 mg total) by mouth daily.  . Multiple Vitamin (MULTIVITAMIN) tablet Take 1 tablet by mouth daily.  . pantoprazole (PROTONIX) 20 MG tablet Take 20 mg by mouth daily.   . rosuvastatin (CRESTOR) 10 MG tablet Take 10 mg by mouth daily.  . Semaglutide,0.25 or 0.5MG /DOS, (OZEMPIC, 0.25 OR 0.5 MG/DOSE,) 2 MG/1.5ML SOPN 0.25-0.5 mg  . spironolactone (ALDACTONE) 25 MG tablet TAKE 1 TABLET (25 MG TOTAL) BY MOUTH IN THE MORNING.  . TURMERIC-GINGER PO Take 1 tablet by mouth daily.  . Zinc 50 MG TABS Take 50 mg by mouth daily.    No facility-administered  encounter medications on file as of 12/04/2020.     Review of Systems  Review of Systems  No chest pain with exertion, no orthopnea or PND, no lower extremity swelling.  Comprehensive review of systems otherwise negative Physical Exam  BP 124/62   Pulse 68   Temp 98.5 F (36.9 C) (Temporal)   Ht 6' (1.829 m)   Wt (!) 303 lb (137.4 kg)   SpO2 95% Comment: on RA  BMI 41.09 kg/m   Wt Readings from Last 5 Encounters:  12/04/20 (!) 303 lb (137.4 kg)  09/09/20 (!) 303 lb (137.4 kg)  05/20/20 273 lb (123.8 kg)  05/16/20 273 lb (123.8 kg)  03/12/20 292 lb (132.5 kg)    BMI Readings from Last 5 Encounters:  12/04/20 41.09 kg/m  09/09/20 41.09 kg/m  05/20/20 37.03 kg/m  05/16/20 37.03 kg/m  03/12/20 39.60 kg/m     Physical Exam General: Well-appearing, no acute distress Eyes: EOMI, no icterus Neck: Supple, no JVP Cardiovascular: Regular rate and rhythm, no murmur Pulmonary: Clear to auscultation bilaterally, no wheeze, normal work of breathing Abdomen: Nondistended, bowel sounds present MSK: No synovitis, joint effusion Neuro: Normal gait, no weakness Psych: Normal mood, full affect  Assessment & Plan:   Pulmonary nodules: New pulmonary evaluation of these. Stable over at least 1 year 2020 to 2021 with several visualized dating back to CT abdomen/pelvis 2013.  Many are calcified and likely represents chronic granulomatous disease, suspect infectious etiology.  Benign. No further follow up needed.    Return if symptoms worsen or fail to improve.   Lanier Clam, MD 12/11/2020

## 2021-01-02 DIAGNOSIS — I10 Essential (primary) hypertension: Secondary | ICD-10-CM | POA: Diagnosis not present

## 2021-01-20 DIAGNOSIS — H40013 Open angle with borderline findings, low risk, bilateral: Secondary | ICD-10-CM | POA: Diagnosis not present

## 2021-01-20 DIAGNOSIS — H43813 Vitreous degeneration, bilateral: Secondary | ICD-10-CM | POA: Diagnosis not present

## 2021-01-20 DIAGNOSIS — H35372 Puckering of macula, left eye: Secondary | ICD-10-CM | POA: Diagnosis not present

## 2021-01-20 DIAGNOSIS — H2513 Age-related nuclear cataract, bilateral: Secondary | ICD-10-CM | POA: Diagnosis not present

## 2021-01-21 DIAGNOSIS — K5730 Diverticulosis of large intestine without perforation or abscess without bleeding: Secondary | ICD-10-CM | POA: Diagnosis not present

## 2021-01-21 DIAGNOSIS — D122 Benign neoplasm of ascending colon: Secondary | ICD-10-CM | POA: Diagnosis not present

## 2021-01-21 DIAGNOSIS — Z86010 Personal history of colonic polyps: Secondary | ICD-10-CM | POA: Diagnosis not present

## 2021-01-21 DIAGNOSIS — K649 Unspecified hemorrhoids: Secondary | ICD-10-CM | POA: Diagnosis not present

## 2021-01-24 DIAGNOSIS — D122 Benign neoplasm of ascending colon: Secondary | ICD-10-CM | POA: Diagnosis not present

## 2021-02-07 DIAGNOSIS — I10 Essential (primary) hypertension: Secondary | ICD-10-CM | POA: Diagnosis not present

## 2021-02-25 DIAGNOSIS — E7800 Pure hypercholesterolemia, unspecified: Secondary | ICD-10-CM | POA: Diagnosis not present

## 2021-02-25 DIAGNOSIS — E118 Type 2 diabetes mellitus with unspecified complications: Secondary | ICD-10-CM | POA: Diagnosis not present

## 2021-02-25 DIAGNOSIS — I10 Essential (primary) hypertension: Secondary | ICD-10-CM | POA: Diagnosis not present

## 2021-02-26 ENCOUNTER — Other Ambulatory Visit: Payer: Self-pay | Admitting: Cardiology

## 2021-02-26 DIAGNOSIS — I10 Essential (primary) hypertension: Secondary | ICD-10-CM

## 2021-03-04 DIAGNOSIS — Z125 Encounter for screening for malignant neoplasm of prostate: Secondary | ICD-10-CM | POA: Diagnosis not present

## 2021-03-04 DIAGNOSIS — I10 Essential (primary) hypertension: Secondary | ICD-10-CM | POA: Diagnosis not present

## 2021-03-07 DIAGNOSIS — E559 Vitamin D deficiency, unspecified: Secondary | ICD-10-CM | POA: Diagnosis not present

## 2021-03-07 DIAGNOSIS — R195 Other fecal abnormalities: Secondary | ICD-10-CM | POA: Diagnosis not present

## 2021-03-10 DIAGNOSIS — I10 Essential (primary) hypertension: Secondary | ICD-10-CM | POA: Diagnosis not present

## 2021-03-12 ENCOUNTER — Ambulatory Visit: Payer: Medicare Other | Admitting: Cardiology

## 2021-03-12 ENCOUNTER — Encounter: Payer: Self-pay | Admitting: Cardiology

## 2021-03-12 ENCOUNTER — Other Ambulatory Visit: Payer: Self-pay

## 2021-03-12 VITALS — BP 119/82 | HR 68 | Temp 98.2°F | Resp 16 | Ht 72.0 in | Wt 296.0 lb

## 2021-03-12 DIAGNOSIS — Z87891 Personal history of nicotine dependence: Secondary | ICD-10-CM

## 2021-03-12 DIAGNOSIS — E66813 Obesity, class 3: Secondary | ICD-10-CM

## 2021-03-12 DIAGNOSIS — I77810 Thoracic aortic ectasia: Secondary | ICD-10-CM | POA: Diagnosis not present

## 2021-03-12 DIAGNOSIS — G4733 Obstructive sleep apnea (adult) (pediatric): Secondary | ICD-10-CM | POA: Diagnosis not present

## 2021-03-12 DIAGNOSIS — Z6841 Body Mass Index (BMI) 40.0 and over, adult: Secondary | ICD-10-CM | POA: Diagnosis not present

## 2021-03-12 DIAGNOSIS — I340 Nonrheumatic mitral (valve) insufficiency: Secondary | ICD-10-CM

## 2021-03-12 DIAGNOSIS — I2584 Coronary atherosclerosis due to calcified coronary lesion: Secondary | ICD-10-CM | POA: Diagnosis not present

## 2021-03-12 DIAGNOSIS — I10 Essential (primary) hypertension: Secondary | ICD-10-CM

## 2021-03-12 DIAGNOSIS — Z9989 Dependence on other enabling machines and devices: Secondary | ICD-10-CM | POA: Diagnosis not present

## 2021-03-12 DIAGNOSIS — I2510 Atherosclerotic heart disease of native coronary artery without angina pectoris: Secondary | ICD-10-CM

## 2021-03-12 DIAGNOSIS — I700 Atherosclerosis of aorta: Secondary | ICD-10-CM | POA: Diagnosis not present

## 2021-03-12 NOTE — Progress Notes (Signed)
Primary Physician:  Deland Pretty, MD  Patient ID: [name] [name], male    DOB: [birth year], 75 y.o.   MRN: [id number]   Date: 03/12/21 Last Office Visit: 09/09/2020   Subjective:    Chief Complaint  Patient presents with   Hypertension   aortic root dilatation   Follow-up    HPI: [name] [name]  is a 76 y.o. male  with hypertension, lumbar spinal stenosis, obstructive sleep apnea on CPAP, mild ascending aortic aneurysm 4.4 cm (July 2021), advanced age, coronary artery calcification, former smoker, and obesity due to excess calories s/p gastric banding surgery in 2010 presents to the office with a chief complaint of " 52-monthblood pressure management and ascending aorta followup."  He  is accompanied by his wife at today's office visit.   Patient has a prior history of ascending aorta dilation of approximately 4.4 cm as per CTA chest with contrast noted back in July 2021.  He had an echocardiogram in March 2020 which noted trileaflet aortic valve without any significant stenosis or regurgitation.  Home blood pressures are very well controlled.  Patient states that he checks it on a regular basis and SBP ranges between 118-123 mmHg.  Based on surface echocardiogram performed in March 2022 the aortic root noted to be 46 mm, proximal ascending aorta was 44 mm.  No hospitalizations or urgent care visits for cardiovascular symptoms since last office visit.  Denies any chest pain at rest or with effort related activities.  His shortness of breath with effort related activities remains relatively stable.   Past Medical History:  Diagnosis Date   Aneurysm, ascending aorta ([place]    Arthritis    Coronary artery disease    pt denies   Difficulty urinating    prostate problem   GERD (gastroesophageal reflux disease)    HIATAL HERNIA REPAIRED -with lap band 2 years ago NO LONGER HAS GERD   History of elevated glucose    IN THE PAST - NO PROBLEMS SINCE GASTRIC BANDING WEIGHT LOSS     History of kidney stones    Hyperlipidemia    Hypertension    resolved with lap band 2 years ago   Pneumonia    Pulmonary hypertension ([facility]    per echo report  pt unaware   Sleep apnea    uses cpap setting is 12    Past Surgical History:  Procedure Laterality Date   COLONOSCOPY  07/17/11   CYSTOSCOPY W/ URETERAL STENT PLACEMENT  07/18/2011   Procedure: CYSTOSCOPY WITH STENT REPLACEMENT;  Surgeon: TAlexis Frock  Location: [facility];  Service: Urology;  Laterality: Left;   GASTRIC RESTRICTION SURGERY  12/11/2008   lap band   HEMORRHOID SURGERY N/A 08/04/2013   Procedure: EUA,HEMORRHOIDECTOMY;  Surgeon: MPedro Earls MD;  Location: [facility];  Service: General;  Laterality: N/A;   LAPAROSCOPIC GASTRIC BANDING  12/11/2008   LITHOTRIPSY      Social History   Tobacco Use   Smoking status: Former    Packs/day: 2.00    Years: 8.00    Pack years: 16.00    Types: Cigarettes    Quit date: 04/05/1971    Years since quitting: 49.9   Smokeless tobacco: Former    Types: Snuff  Vaping Use   Vaping Use: Never used  Substance Use Topics   Alcohol use: Yes    Alcohol/week: 0.0 standard drinks    Comment: occasional beer   Drug use: Never     Review of Systems  Constitutional: Negative for decreased appetite, malaise/fatigue, weight gain and weight loss.  Eyes:  Negative for visual disturbance.  Cardiovascular:  Positive for dyspnea on exertion (improved). Negative for chest pain, claudication, leg swelling (end of the day; improved with support stockings), orthopnea, palpitations and syncope.  Respiratory:  Negative for hemoptysis and wheezing.   Endocrine: Negative for cold intolerance and heat intolerance.  Hematologic/Lymphatic: Does not bruise/bleed easily.  Skin:  Negative for nail changes.  Musculoskeletal:  Negative for muscle weakness and myalgias.  Gastrointestinal:  Negative for abdominal pain, change in bowel habit, nausea and vomiting.  Neurological:  Negative for difficulty  with concentration, dizziness, focal weakness and headaches.  Psychiatric/Behavioral:  Negative for altered mental status and suicidal ideas.   All other systems reviewed and are negative.    Objective:  Blood pressure 119/82, pulse 68, temperature 98.2 F (36.8 C), temperature source Temporal, resp. rate 16, height 6' (1.829 m), weight 296 lb (134.3 kg), SpO2 95 %. Body mass index is 40.14 kg/m.    Physical Exam Vitals reviewed.  Constitutional:      General: He is not in acute distress.    Appearance: He is well-developed.  HENT:     Head: Normocephalic and atraumatic.  Cardiovascular:     Rate and Rhythm: Normal rate and regular rhythm.     Pulses: Intact distal pulses.          Femoral pulses are 2+ on the right side and 2+ on the left side.      Popliteal pulses are 2+ on the right side and 2+ on the left side.       Dorsalis pedis pulses are 1+ on the right side and 1+ on the left side.       Posterior tibial pulses are 2+ on the right side and 2+ on the left side.     Heart sounds: Normal heart sounds.  Pulmonary:     Effort: Pulmonary effort is normal. No accessory muscle usage or respiratory distress.     Breath sounds: Normal breath sounds.  Abdominal:     General: Bowel sounds are normal.     Palpations: Abdomen is soft.  Musculoskeletal:        General: Normal range of motion.     Cervical back: Normal range of motion.  Skin:    General: Skin is warm and dry.  Neurological:     Mental Status: He is alert and oriented to person, place, and time.  Psychiatric:        Behavior: Behavior normal.   Radiology: CTA chest with contrast 01/29/2020 Stable mild aneurysmal dilatation of the ascending thoracic aorta, maximal diameter 4.4 cm.  No significant interval change.  Negative for acute pulmonary embolus  No other acute intrathoracic finding  Native coronary atherosclerosis  Cholelithiasis  Aortic Atherosclerosis (ICD10-I70.0).  Lungs/Pleura: No focal pneumonia,  collapse or consolidation. No acute airspace process or interstitial disease. There are a few scattered subcentimeter punctate calcified granulomas in the lingula, superior segment right lower lobe, and left lower lobe. Stable benign 5 mm subpleural left lower lobe nodule, image 104 series 7. No new or enlarging nodule.  Laboratory examination:   External Labs: Collected: 08/13/2020 provided by PCP. Creatinine 1 mg/dL. eGFR: 77 mL/min per 1.73 m Sodium 141, potassium 5.9, chloride 103, bicarb 26 AST 18, ALT 23, Lipid profile: Total cholesterol 103, triglycerides 94, HDL 34, non-HDL 51 Hemoglobin A1c: 6  There are no discontinued medications.  Current Meds  Medication Sig  acetaminophen (TYLENOL) 500 MG tablet Take 1,000 mg by mouth every 6 (six) hours as needed for moderate pain.   alfuzosin (UROXATRAL) 10 MG 24 hr tablet Take 10 mg by mouth daily.   amLODipine (NORVASC) 10 MG tablet Take 10 mg by mouth daily.   Ascorbic Acid (VITAMIN C) 500 MG CAPS See admin instructions.   aspirin EC 81 MG tablet Take 81 mg by mouth daily.   carvedilol (COREG) 3.125 MG tablet TAKE 1 TABLET (3.125 MG TOTAL) BY MOUTH 2 (TWO) TIMES DAILY WITH A MEAL.   Cholecalciferol (VITAMIN D3) 125 MCG (5000 UT) TABS Take 5,000 Units by mouth daily.    clotrimazole-betamethasone (LOTRISONE) cream Apply 1 application topically 2 (two) times daily.   diphenhydrAMINE HCl, Sleep, (ZZZQUIL) 25 MG CAPS Take 50 mg by mouth at bedtime.   docusate sodium (COLACE) 100 MG capsule Take 200 mg by mouth daily as needed for mild constipation.   gabapentin (NEURONTIN) 100 MG capsule Take 200 mg by mouth 2 (two) times a day.    irbesartan (AVAPRO) 75 MG tablet Take 1 tablet (75 mg total) by mouth daily.   Multiple Vitamin (MULTIVITAMIN) tablet Take 1 tablet by mouth daily.   pantoprazole (PROTONIX) 20 MG tablet Take 20 mg by mouth daily.    rosuvastatin (CRESTOR) 10 MG tablet Take 10 mg by mouth daily.   Semaglutide,0.25 or  0.5MG/DOS, (OZEMPIC, 0.25 OR 0.5 MG/DOSE,) 2 MG/1.5ML SOPN 0.25-0.5 mg   spironolactone (ALDACTONE) 25 MG tablet TAKE 1 TABLET (25 MG TOTAL) BY MOUTH IN THE MORNING. (Patient taking differently: Take 12.5 mg by mouth daily.)   TURMERIC-GINGER PO Take 1 tablet by mouth daily.   Zinc 50 MG TABS Take 50 mg by mouth daily.     Cardiac Studies:  EKG: 03/12/2021: Sinus bradycardia, 56 bpm, without underlying ischemia or injury pattern.  Echocardiogram 09/26/2020:  1. Left ventricular ejection fraction, by estimation, is 60 to 65%. The  left ventricle has normal function. The left ventricle has no regional  wall motion abnormalities. Left ventricular diastolic parameters were  normal.   2. Right ventricular systolic function is normal. The right ventricular  size is enlarged. There is normal pulmonary artery systolic pressure.   3. Left atrial size was moderately dilated.   4. Right atrial size was dilated.   5. The mitral valve is normal in structure. Mild mitral valve  regurgitation. No evidence of mitral stenosis.   6. The aortic valve is tricuspid. Aortic valve regurgitation is not  visualized. N/o aortic stenosis is present.   7. Aortic dilatation noted. There is dilatation of the aortic root,  measuring 46 mm. There is dilatation of the ascending aorta, measuring 44  mm.   8. The inferior vena cava is normal in size with greater than 50%  respiratory variability, suggesting right atrial pressure of 3 mmHg.   Comparison(s): Prior study 08/31/2018: LVEF 55%, moderate LVH, severely  dilated LA, Moderate TR, Moderate PHTN PASP 61mHg, aortic root 435m   Exercise myoview stress test  05/01/2019: Patient exercised on Bruce protocol for 5:00  minutes and achieved  99 % of Max Predicted HR (Target HR was >85% MPHR) and  6.79 METS. Stress symptoms included fatigue and dyspnea. Normal BP response.  Exercise capacity was low. The perfusion imaging study demonstrates mild diaphragmatic attenuation  artifact and normal perfusion in all vascular territories. Dynamic gated images reveal normal endocardial thickening and left ventricular systolic function. LVEF calculated was   45% but visually appeared to be  normal. This is a low risk study.  US Carotid Bilateral [facility] 05/01/2015: Normal. Negative for stenosis.    ABI 09/29/2019: This exam reveals normal perfusion of the right lower extremity (ABI 1.19 ) and normal perfusion of the left lower extremity (ABI 1.26).    Sleep Study: Positive for Sleep Apnea; Uses CPAP  Assessment:     ICD-10-CM   1. Ascending aorta dilation (HCC)  I77.810 EKG 12-Lead    CT ANGIO CHEST AORTA W/ & OR WO/CM & GATING ([place] ONLY)    Basic metabolic panel    2. Nonrheumatic mitral valve regurgitation  I34.0     3. Coronary atherosclerosis due to calcified coronary lesion of native artery  I25.10    I25.84     4. Atherosclerosis of aorta (HCC)  I70.0     5. Essential hypertension  I43 Basic metabolic panel    6. Former smoker  Z87.891     30. OSA on CPAP  G47.33    Z99.89     8. Class 3 severe obesity due to excess calories with serious comorbidity and body mass index (BMI) of 40.0 to 44.9 in adult [facility])  E66.01    Z68.41      Recommendations:   Ascending aorta dilation (HCC) Based on the last CTA chest performed back in July 2021 ascending aorta was approximately 44 mm.  Recent echocardiogram notes that the ascending aorta remains relatively stable; however, in the sinuses of Valsalva were documented to measure 46 mm. Echocardiogram notes a trileaflet aortic valve without any significant aortic valve disease. Shared decision was to continue with current antihypertensive medications and to keep her systolic goal blood pressure 120 mmHg if able to tolerate. Check BMP Recheck CTA chest as part of annual follow-up -reevaluate the dimensions of the sinus of Valsalva.  Nonrheumatic mitral valve regurgitation Based on the recent echocardiogram the  severity of MR has improved. Continue with blood pressure management and serial follow-up.  Coronary atherosclerosis due to calcified coronary lesion of native artery Currently chest pain-free. ECG nonischemic. Medications reconciled. Continue aspirin and statin therapy. Last ischemic evaluation reviewed as part of this office visit.  Atherosclerosis of aorta (HCC) Continue aspirin and statin therapy.  Essential hypertension Blood pressures are very well controlled. Medications reconciled. Low-salt diet recommended.  OSA on CPAP Educated importance of continued compliance with CPAP on a daily basis.  Class 3 severe obesity due to excess calories with serious comorbidity and body mass index (BMI) of 40.0 to 44.9 in adult [facility]) Body mass index is 40.14 kg/m. I reviewed with the patient the importance of diet, regular physical activity/exercise, weight loss.   Patient is educated on increasing physical activity gradually as tolerated.  With the goal of moderate intensity exercise for 30 minutes a day 5 days a week.    Orders Placed This Encounter  Procedures   CT ANGIO CHEST AORTA W/ & OR WO/CM & GATING ([place] ONLY)   Basic metabolic panel   EKG [id number]   --Continue cardiac medications as reconciled in final medication list. --Return in about 6 months (around 09/09/2021) for Follow up ascending aorta, blood pressure management.. Or sooner if needed. --Continue follow-up with your primary care physician regarding the management of your other chronic comorbid conditions.  Patient's questions and concerns were addressed to his satisfaction. He voices understanding of the instructions provided during this encounter.   This note was created using a voice recognition software as a result there may be grammatical errors inadvertently  enclosed that do not reflect the nature of this encounter. Every attempt is made to correct such errors.  Rex Kras, [place], [facility]  Pager:  [phone] Office: [phone]

## 2021-03-17 ENCOUNTER — Other Ambulatory Visit: Payer: Self-pay | Admitting: Cardiology

## 2021-03-17 DIAGNOSIS — I10 Essential (primary) hypertension: Secondary | ICD-10-CM

## 2021-03-17 DIAGNOSIS — I77819 Aortic ectasia, unspecified site: Secondary | ICD-10-CM

## 2021-03-18 DIAGNOSIS — I10 Essential (primary) hypertension: Secondary | ICD-10-CM | POA: Diagnosis not present

## 2021-03-18 DIAGNOSIS — I77810 Thoracic aortic ectasia: Secondary | ICD-10-CM | POA: Diagnosis not present

## 2021-03-19 LAB — BASIC METABOLIC PANEL
BUN/Creatinine Ratio: 13 (ref 10–24)
BUN: 13 mg/dL (ref 8–27)
CO2: 20 mmol/L (ref 20–29)
Calcium: 9.5 mg/dL (ref 8.6–10.2)
Chloride: 105 mmol/L (ref 96–106)
Creatinine, Ser: 0.97 mg/dL (ref 0.76–1.27)
Glucose: 124 mg/dL — ABNORMAL HIGH (ref 65–99)
Potassium: 5.2 mmol/L (ref 3.5–5.2)
Sodium: 142 mmol/L (ref 134–144)
eGFR: 81 mL/min/{1.73_m2} (ref >59)

## 2021-03-28 DIAGNOSIS — Z23 Encounter for immunization: Secondary | ICD-10-CM | POA: Diagnosis not present

## 2021-04-09 DIAGNOSIS — I10 Essential (primary) hypertension: Secondary | ICD-10-CM | POA: Diagnosis not present

## 2021-04-24 DIAGNOSIS — H2513 Age-related nuclear cataract, bilateral: Secondary | ICD-10-CM | POA: Diagnosis not present

## 2021-04-24 DIAGNOSIS — H401211 Low-tension glaucoma, right eye, mild stage: Secondary | ICD-10-CM | POA: Diagnosis not present

## 2021-04-24 DIAGNOSIS — H43813 Vitreous degeneration, bilateral: Secondary | ICD-10-CM | POA: Diagnosis not present

## 2021-04-24 DIAGNOSIS — H35372 Puckering of macula, left eye: Secondary | ICD-10-CM | POA: Diagnosis not present

## 2021-05-14 DIAGNOSIS — H43813 Vitreous degeneration, bilateral: Secondary | ICD-10-CM | POA: Diagnosis not present

## 2021-05-14 DIAGNOSIS — H401211 Low-tension glaucoma, right eye, mild stage: Secondary | ICD-10-CM | POA: Diagnosis not present

## 2021-05-14 DIAGNOSIS — H35372 Puckering of macula, left eye: Secondary | ICD-10-CM | POA: Diagnosis not present

## 2021-05-14 DIAGNOSIS — H2513 Age-related nuclear cataract, bilateral: Secondary | ICD-10-CM | POA: Diagnosis not present

## 2021-05-15 ENCOUNTER — Other Ambulatory Visit: Payer: Self-pay

## 2021-05-15 DIAGNOSIS — I2510 Atherosclerotic heart disease of native coronary artery without angina pectoris: Secondary | ICD-10-CM

## 2021-05-15 DIAGNOSIS — I77810 Thoracic aortic ectasia: Secondary | ICD-10-CM

## 2021-05-15 DIAGNOSIS — I2584 Coronary atherosclerosis due to calcified coronary lesion: Secondary | ICD-10-CM

## 2021-05-19 DIAGNOSIS — I10 Essential (primary) hypertension: Secondary | ICD-10-CM | POA: Diagnosis not present

## 2021-05-20 LAB — BASIC METABOLIC PANEL
BUN/Creatinine Ratio: 12 (ref 10–24)
BUN: 10 mg/dL (ref 8–27)
CO2: 23 mmol/L (ref 20–29)
Calcium: 9.4 mg/dL (ref 8.6–10.2)
Chloride: 101 mmol/L (ref 96–106)
Creatinine, Ser: 0.81 mg/dL (ref 0.76–1.27)
Glucose: 104 mg/dL — ABNORMAL HIGH (ref 70–99)
Potassium: 5.2 mmol/L (ref 3.5–5.2)
Sodium: 137 mmol/L (ref 134–144)
eGFR: 91 mL/min/{1.73_m2} (ref >59)

## 2021-05-23 ENCOUNTER — Ambulatory Visit (HOSPITAL_COMMUNITY): Payer: Medicare Other

## 2021-05-27 ENCOUNTER — Other Ambulatory Visit (HOSPITAL_COMMUNITY): Payer: Self-pay

## 2021-05-27 DIAGNOSIS — E7800 Pure hypercholesterolemia, unspecified: Secondary | ICD-10-CM | POA: Diagnosis not present

## 2021-05-27 DIAGNOSIS — E118 Type 2 diabetes mellitus with unspecified complications: Secondary | ICD-10-CM | POA: Diagnosis not present

## 2021-05-27 DIAGNOSIS — I10 Essential (primary) hypertension: Secondary | ICD-10-CM | POA: Diagnosis not present

## 2021-05-27 MED ORDER — OZEMPIC (2 MG/DOSE) 8 MG/3ML ~~LOC~~ SOPN
PEN_INJECTOR | SUBCUTANEOUS | 3 refills | Status: DC
  Filled 2021-05-27: qty 3, 28d supply, fill #0
  Filled 2021-06-17: qty 3, 28d supply, fill #1
  Filled 2021-07-22: qty 3, 28d supply, fill #2
  Filled 2021-08-18: qty 3, 28d supply, fill #3

## 2021-05-30 ENCOUNTER — Ambulatory Visit (HOSPITAL_COMMUNITY)
Admission: RE | Admit: 2021-05-30 | Discharge: 2021-05-30 | Disposition: A | Discharge status: Home / Self Care | Payer: Medicare Other | Source: Ambulatory Visit | Attending: Cardiology | Admitting: Cardiology

## 2021-05-30 ENCOUNTER — Other Ambulatory Visit: Payer: Self-pay

## 2021-05-30 DIAGNOSIS — I77810 Thoracic aortic ectasia: Secondary | ICD-10-CM | POA: Insufficient documentation

## 2021-05-30 DIAGNOSIS — R911 Solitary pulmonary nodule: Secondary | ICD-10-CM | POA: Diagnosis not present

## 2021-05-30 DIAGNOSIS — J8410 Pulmonary fibrosis, unspecified: Secondary | ICD-10-CM | POA: Diagnosis not present

## 2021-05-30 MED ORDER — IOHEXOL 350 MG/ML SOLN
100.0000 mL | Freq: Once | INTRAVENOUS | Status: AC | PRN
  Administered 2021-05-30: 100 mL via INTRAVENOUS

## 2021-06-01 ENCOUNTER — Other Ambulatory Visit: Payer: Self-pay | Admitting: Cardiology

## 2021-06-01 DIAGNOSIS — I10 Essential (primary) hypertension: Secondary | ICD-10-CM

## 2021-06-03 NOTE — Progress Notes (Signed)
Patient is aware 

## 2021-06-09 DIAGNOSIS — I10 Essential (primary) hypertension: Secondary | ICD-10-CM | POA: Diagnosis not present

## 2021-06-17 ENCOUNTER — Other Ambulatory Visit (HOSPITAL_COMMUNITY): Payer: Self-pay

## 2021-07-10 DIAGNOSIS — I10 Essential (primary) hypertension: Secondary | ICD-10-CM | POA: Diagnosis not present

## 2021-07-17 ENCOUNTER — Other Ambulatory Visit: Payer: Self-pay | Admitting: Cardiology

## 2021-07-17 DIAGNOSIS — I10 Essential (primary) hypertension: Secondary | ICD-10-CM

## 2021-07-22 ENCOUNTER — Other Ambulatory Visit (HOSPITAL_COMMUNITY): Payer: Self-pay

## 2021-07-23 ENCOUNTER — Other Ambulatory Visit (HOSPITAL_COMMUNITY): Payer: Self-pay

## 2021-07-25 DIAGNOSIS — Z23 Encounter for immunization: Secondary | ICD-10-CM | POA: Diagnosis not present

## 2021-08-05 DIAGNOSIS — E118 Type 2 diabetes mellitus with unspecified complications: Secondary | ICD-10-CM | POA: Diagnosis not present

## 2021-08-05 DIAGNOSIS — I10 Essential (primary) hypertension: Secondary | ICD-10-CM | POA: Diagnosis not present

## 2021-08-05 DIAGNOSIS — I119 Hypertensive heart disease without heart failure: Secondary | ICD-10-CM | POA: Diagnosis not present

## 2021-08-05 DIAGNOSIS — E7800 Pure hypercholesterolemia, unspecified: Secondary | ICD-10-CM | POA: Diagnosis not present

## 2021-08-10 DIAGNOSIS — I10 Essential (primary) hypertension: Secondary | ICD-10-CM | POA: Diagnosis not present

## 2021-08-13 DIAGNOSIS — H401211 Low-tension glaucoma, right eye, mild stage: Secondary | ICD-10-CM | POA: Diagnosis not present

## 2021-08-13 DIAGNOSIS — H2513 Age-related nuclear cataract, bilateral: Secondary | ICD-10-CM | POA: Diagnosis not present

## 2021-08-13 DIAGNOSIS — H43813 Vitreous degeneration, bilateral: Secondary | ICD-10-CM | POA: Diagnosis not present

## 2021-08-13 DIAGNOSIS — H35372 Puckering of macula, left eye: Secondary | ICD-10-CM | POA: Diagnosis not present

## 2021-08-18 ENCOUNTER — Other Ambulatory Visit (HOSPITAL_COMMUNITY): Payer: Self-pay

## 2021-08-25 DIAGNOSIS — E118 Type 2 diabetes mellitus with unspecified complications: Secondary | ICD-10-CM | POA: Diagnosis not present

## 2021-08-25 DIAGNOSIS — E7800 Pure hypercholesterolemia, unspecified: Secondary | ICD-10-CM | POA: Diagnosis not present

## 2021-08-25 DIAGNOSIS — I10 Essential (primary) hypertension: Secondary | ICD-10-CM | POA: Diagnosis not present

## 2021-08-28 ENCOUNTER — Other Ambulatory Visit (HOSPITAL_COMMUNITY): Payer: Self-pay

## 2021-08-28 DIAGNOSIS — K219 Gastro-esophageal reflux disease without esophagitis: Secondary | ICD-10-CM | POA: Diagnosis not present

## 2021-08-28 DIAGNOSIS — E7800 Pure hypercholesterolemia, unspecified: Secondary | ICD-10-CM | POA: Diagnosis not present

## 2021-08-28 DIAGNOSIS — G629 Polyneuropathy, unspecified: Secondary | ICD-10-CM | POA: Diagnosis not present

## 2021-08-28 DIAGNOSIS — I10 Essential (primary) hypertension: Secondary | ICD-10-CM | POA: Diagnosis not present

## 2021-08-28 DIAGNOSIS — E118 Type 2 diabetes mellitus with unspecified complications: Secondary | ICD-10-CM | POA: Diagnosis not present

## 2021-08-28 MED ORDER — IRBESARTAN 75 MG PO TABS
ORAL_TABLET | ORAL | 3 refills | Status: DC
  Filled 2021-08-28: qty 90, 90d supply, fill #0

## 2021-08-28 MED ORDER — PANTOPRAZOLE SODIUM 20 MG PO TBEC
DELAYED_RELEASE_TABLET | ORAL | 3 refills | Status: DC
  Filled 2021-08-28: qty 90, 90d supply, fill #0

## 2021-08-28 MED ORDER — GABAPENTIN 300 MG PO CAPS
ORAL_CAPSULE | ORAL | 3 refills | Status: DC
  Filled 2021-08-28: qty 180, 90d supply, fill #0

## 2021-08-28 MED ORDER — SPIRONOLACTONE 25 MG PO TABS
ORAL_TABLET | ORAL | 3 refills | Status: DC
  Filled 2021-08-28: qty 45, 90d supply, fill #0

## 2021-08-28 MED ORDER — OZEMPIC (2 MG/DOSE) 8 MG/3ML ~~LOC~~ SOPN
PEN_INJECTOR | SUBCUTANEOUS | 3 refills | Status: DC
  Filled 2021-08-28 – 2021-09-16 (×2): qty 3, 28d supply, fill #0
  Filled 2021-10-20: qty 3, 28d supply, fill #1
  Filled 2021-11-17: qty 3, 28d supply, fill #2
  Filled 2021-12-17: qty 3, 28d supply, fill #3

## 2021-08-28 MED ORDER — ROSUVASTATIN CALCIUM 10 MG PO TABS
ORAL_TABLET | ORAL | 3 refills | Status: DC
  Filled 2021-08-28: qty 90, 90d supply, fill #0

## 2021-08-28 MED ORDER — AMLODIPINE BESYLATE 10 MG PO TABS
ORAL_TABLET | ORAL | 3 refills | Status: DC
  Filled 2021-08-28: qty 90, 90d supply, fill #0

## 2021-08-29 ENCOUNTER — Other Ambulatory Visit (HOSPITAL_COMMUNITY): Payer: Self-pay

## 2021-09-01 ENCOUNTER — Other Ambulatory Visit (HOSPITAL_COMMUNITY): Payer: Self-pay

## 2021-09-02 ENCOUNTER — Other Ambulatory Visit: Payer: Self-pay | Admitting: Pharmacist

## 2021-09-02 ENCOUNTER — Other Ambulatory Visit (HOSPITAL_COMMUNITY): Payer: Self-pay

## 2021-09-04 ENCOUNTER — Other Ambulatory Visit: Payer: Self-pay | Admitting: Cardiology

## 2021-09-04 DIAGNOSIS — I10 Essential (primary) hypertension: Secondary | ICD-10-CM

## 2021-09-09 ENCOUNTER — Other Ambulatory Visit (HOSPITAL_COMMUNITY): Payer: Self-pay

## 2021-09-09 DIAGNOSIS — I10 Essential (primary) hypertension: Secondary | ICD-10-CM | POA: Diagnosis not present

## 2021-09-10 ENCOUNTER — Encounter: Payer: Self-pay | Admitting: Cardiology

## 2021-09-10 ENCOUNTER — Ambulatory Visit: Payer: Medicare Other | Admitting: Cardiology

## 2021-09-10 ENCOUNTER — Other Ambulatory Visit: Payer: Self-pay

## 2021-09-10 VITALS — BP 118/68 | HR 64 | Temp 97.8°F | Resp 14 | Ht 72.0 in | Wt 283.6 lb

## 2021-09-10 DIAGNOSIS — I700 Atherosclerosis of aorta: Secondary | ICD-10-CM

## 2021-09-10 DIAGNOSIS — I10 Essential (primary) hypertension: Secondary | ICD-10-CM

## 2021-09-10 DIAGNOSIS — G4733 Obstructive sleep apnea (adult) (pediatric): Secondary | ICD-10-CM

## 2021-09-10 DIAGNOSIS — I77810 Thoracic aortic ectasia: Secondary | ICD-10-CM

## 2021-09-10 DIAGNOSIS — I2510 Atherosclerotic heart disease of native coronary artery without angina pectoris: Secondary | ICD-10-CM

## 2021-09-10 DIAGNOSIS — Z87891 Personal history of nicotine dependence: Secondary | ICD-10-CM

## 2021-09-10 DIAGNOSIS — I340 Nonrheumatic mitral (valve) insufficiency: Secondary | ICD-10-CM | POA: Diagnosis not present

## 2021-09-10 DIAGNOSIS — I2584 Coronary atherosclerosis due to calcified coronary lesion: Secondary | ICD-10-CM

## 2021-09-10 MED ORDER — AMLODIPINE BESYLATE 5 MG PO TABS
5.0000 mg | ORAL_TABLET | Freq: Every morning | ORAL | 0 refills | Status: DC
Start: 2021-09-10 — End: 2022-05-06

## 2021-09-10 MED ORDER — SPIRONOLACTONE 25 MG PO TABS: ORAL_TABLET | ORAL | 0 refills | Status: DC

## 2021-09-10 MED ORDER — CARVEDILOL 3.125 MG PO TABS: ORAL_TABLET | ORAL | 1 refills | Status: DC

## 2021-09-10 NOTE — Progress Notes (Signed)
Primary Physician:  Deland Pretty, MD  Patient ID: [name] [name], male    DOB: October 26, 1945, 76 y.o.   MRN: [id number]   Date: 09/10/21 Last Office Visit: 03/12/2021  Subjective:    Chief Complaint  Patient presents with   Dilatation of ascending aorta   Hypertension   Follow-up   Dizziness   Chest Pain    HPI: [name] [name]  is a 76 y.o. male  with hypertension, lumbar spinal stenosis, obstructive sleep apnea on CPAP, mild ascending aortic aneurysm 4.4 cm (July 2021), advanced age, coronary artery calcification, former smoker, and obesity due to excess calories s/p gastric banding surgery in 2010 presents to the office with a chief complaint of " 98-monthfollow-up for ascending aortic dilatation."  He  is accompanied by his wife at today's office visit.   Patient is being followed by the practice given his ascending aortic dilatation per CTA of the chest with contrast back in July 2021 it measured 44 mm.  He had a repeat CT scan since last office visit in November 2022 and the dimensions of the aorta remain relatively unchanged.  Home blood pressures are very well controlled with SBP around 120 mmHg.  He presents for 639-monthollow-up visit.   Since last office visit he was started on Ozempic and has successfully lost 13 pounds.  He is also started noticing lightheaded and dizziness with changing positions on his current blood pressure regimen.  He denies any near-syncope or syncopal event.   Past Medical History:  Diagnosis Date   Aneurysm, ascending aorta    Arthritis    Coronary artery disease    pt denies   Difficulty urinating    prostate problem   GERD (gastroesophageal reflux disease)    HIATAL HERNIA REPAIRED -with lap band 2 years ago NO LONGER HAS GERD   History of elevated glucose    IN THE PAST - NO PROBLEMS SINCE GASTRIC BANDING WEIGHT LOSS    History of kidney stones    Hyperlipidemia    Hypertension    resolved with lap band 2 years ago   Pneumonia     Pulmonary hypertension (HCAlpha   per echo report  pt unaware   Sleep apnea    uses cpap setting is 12    Past Surgical History:  Procedure Laterality Date   COLONOSCOPY  07/17/11   CYSTOSCOPY W/ URETERAL STENT PLACEMENT  07/18/2011   Procedure: CYSTOSCOPY WITH STENT REPLACEMENT;  Surgeon: ThAlexis Frock Location: [facility];  Service: Urology;  Laterality: Left;   GASTRIC RESTRICTION SURGERY  12/11/2008   lap band   HEMORRHOID SURGERY N/A 08/04/2013   Procedure: EUA,HEMORRHOIDECTOMY;  Surgeon: MaPedro EarlsMD;  Location: [facility];  Service: General;  Laterality: N/A;   LAPAROSCOPIC GASTRIC BANDING  12/11/2008   LITHOTRIPSY      Social History   Tobacco Use   Smoking status: Former    Packs/day: 2.00    Years: 8.00    Pack years: 16.00    Types: Cigarettes    Quit date: 04/05/1971    Years since quitting: 50.4   Smokeless tobacco: Former    Types: Snuff  Vaping Use   Vaping Use: Never used  Substance Use Topics   Alcohol use: Yes    Alcohol/week: 0.0 standard drinks    Comment: occasional beer   Drug use: Never     Review of Systems  Cardiovascular:  Negative for chest pain, dyspnea on exertion, leg swelling, near-syncope,  orthopnea, palpitations, paroxysmal nocturnal dyspnea and syncope.  Respiratory:  Negative for shortness of breath.   Neurological:  Positive for dizziness and light-headedness.     Objective:  Blood pressure 118/68, pulse 64, temperature 97.8 F (36.6 C), temperature source Temporal, resp. rate 14, height 6' (1.829 m), weight 283 lb 9.6 oz (128.6 kg), SpO2 96 %. Body mass index is 38.46 kg/m.  Orthostatic VS for the past 72 hrs (Last 3 readings):  Orthostatic BP Patient Position BP Location Cuff Size Orthostatic Pulse  09/10/21 0949 128/73 Standing Left Arm Large 72  09/10/21 0947 106/64 Sitting Left Arm Large 66  09/10/21 0945 110/68 Supine Left Arm Large 57       Physical Exam Vitals reviewed.  Constitutional:      General: He is not in acute  distress.    Appearance: He is well-developed.  HENT:     Head: Normocephalic and atraumatic.  Cardiovascular:     Rate and Rhythm: Normal rate and regular rhythm.     Pulses: Intact distal pulses.          Femoral pulses are 2+ on the right side and 2+ on the left side.      Popliteal pulses are 2+ on the right side and 2+ on the left side.       Dorsalis pedis pulses are 1+ on the right side and 1+ on the left side.       Posterior tibial pulses are 2+ on the right side and 2+ on the left side.     Heart sounds: Normal heart sounds.  Pulmonary:     Effort: Pulmonary effort is normal. No accessory muscle usage or respiratory distress.     Breath sounds: Normal breath sounds.  Abdominal:     General: Bowel sounds are normal.     Palpations: Abdomen is soft.  Musculoskeletal:        General: Normal range of motion.     Cervical back: Normal range of motion.  Skin:    General: Skin is warm and dry.  Neurological:     Mental Status: He is alert and oriented to person, place, and time.  Psychiatric:        Behavior: Behavior normal.   Radiology: CTA chest with contrast 01/29/2020 Stable mild aneurysmal dilatation of the ascending thoracic aorta, maximal diameter 4.4 cm.  No significant interval change.  Negative for acute pulmonary embolus  No other acute intrathoracic finding  Native coronary atherosclerosis  Cholelithiasis  Aortic Atherosclerosis (ICD10-I70.0).  Lungs/Pleura: No focal pneumonia, collapse or consolidation. No acute airspace process or interstitial disease. There are a few scattered subcentimeter punctate calcified granulomas in the lingula, superior segment right lower lobe, and left lower lobe. Stable benign 5 mm subpleural left lower lobe nodule, image 104 series 7. No new or enlarging nodule.  CTA chest aorta with and without contrast  05/30/2021: Vascular:  1. Unchanged fusiform aneurysmal changes of the ascending thoracic aorta measuring up to 4.4 cm.  Recommend annual imaging followup by CTA or MRA. This recommendation follows 2010 ACCF/AHA/AATS/ACR/ASA/SCA/SCAI/SIR/STS/SVM Guidelines for the Diagnosis and Management of Patients with Thoracic Aortic Disease. Circulation. 2010; 121: C947-S962. Aortic aneurysm NOS (ICD10-I71.9) 2. Coronary and aortic atherosclerosis (ICD10-I70.0). Non-Vascular: No acute or significant intrathoracic abnormality.  Laboratory examination:   External Labs: Collected: 08/13/2020 provided by PCP. Creatinine 1 mg/dL. eGFR: 77 mL/min per 1.73 m Sodium 141, potassium 5.9, chloride 103, bicarb 26 AST 18, ALT 23, Lipid profile: Total cholesterol 103, triglycerides 94, HDL  34, non-HDL 51 Hemoglobin A1c: 6  Medications Discontinued During This Encounter  Medication Reason   gabapentin (NEURONTIN) 100 MG capsule Change in therapy   amLODipine (NORVASC) 10 MG tablet Dose change   spironolactone (ALDACTONE) 25 MG tablet Reorder   carvedilol (COREG) 3.125 MG tablet Reorder    Current Meds  Medication Sig   acetaminophen (TYLENOL) 500 MG tablet Take 1,000 mg by mouth every 6 (six) hours as needed for moderate pain.   alfuzosin (UROXATRAL) 10 MG 24 hr tablet Take 10 mg by mouth daily.   amLODipine (NORVASC) 5 MG tablet Take 1 tablet (5 mg total) by mouth every morning.   Ascorbic Acid (VITAMIN C) 500 MG CAPS See admin instructions.   aspirin EC 81 MG tablet Take 81 mg by mouth daily.   Cholecalciferol (VITAMIN D3) 125 MCG (5000 UT) TABS Take 5,000 Units by mouth daily.    clotrimazole-betamethasone (LOTRISONE) cream Apply 1 application topically 2 (two) times daily.   diphenhydrAMINE HCl, Sleep, (ZZZQUIL) 25 MG CAPS Take 50 mg by mouth at bedtime.   docusate sodium (COLACE) 100 MG capsule Take 200 mg by mouth daily as needed for mild constipation.   gabapentin (NEURONTIN) 300 MG capsule Take 1 capsule by mouth twice daily   irbesartan (AVAPRO) 75 MG tablet TAKE 1 TABLET BY MOUTH EVERY DAY   latanoprost (XALATAN)  0.005 % ophthalmic solution 1 drop at bedtime.   Multiple Vitamin (MULTIVITAMIN) tablet Take 1 tablet by mouth daily.   pantoprazole (PROTONIX) 20 MG tablet Take 20 mg by mouth daily.    rosuvastatin (CRESTOR) 10 MG tablet Take 10 mg by mouth daily.   Semaglutide, 2 MG/DOSE, (OZEMPIC, 2 MG/DOSE,) 8 MG/3ML SOPN Inject 2 mg under the skin once weekly   TURMERIC-GINGER PO Take 1 tablet by mouth daily.   Zinc 50 MG TABS Take 50 mg by mouth daily.    [DISCONTINUED] amLODipine (NORVASC) 10 MG tablet Take 5 mg by mouth daily.   [DISCONTINUED] carvedilol (COREG) 3.125 MG tablet TAKE 1 TABLET BY MOUTH TWICE A DAY WITH A MEAL   [DISCONTINUED] spironolactone (ALDACTONE) 25 MG tablet TAKE 1 TABLET (25 MG TOTAL) BY MOUTH IN THE MORNING.    Cardiac Studies:  EKG: 09/10/2021: Sinus bradycardia, 54 bpm, first-degree AV block, without underlying ischemia or injury pattern.  Echocardiogram 09/26/2020:  1. Left ventricular ejection fraction, by estimation, is 60 to 65%. The  left ventricle has normal function. The left ventricle has no regional  wall motion abnormalities. Left ventricular diastolic parameters were  normal.   2. Right ventricular systolic function is normal. The right ventricular  size is enlarged. There is normal pulmonary artery systolic pressure.   3. Left atrial size was moderately dilated.   4. Right atrial size was dilated.   5. The mitral valve is normal in structure. Mild mitral valve  regurgitation. No evidence of mitral stenosis.   6. The aortic valve is tricuspid. Aortic valve regurgitation is not  visualized. N/o aortic stenosis is present.   7. Aortic dilatation noted. There is dilatation of the aortic root,  measuring 46 mm. There is dilatation of the ascending aorta, measuring 44  mm.   8. The inferior vena cava is normal in size with greater than 50%  respiratory variability, suggesting right atrial pressure of 3 mmHg.   Comparison(s): Prior study 08/31/2018: LVEF 55%,  moderate LVH, severely  dilated LA, Moderate TR, Moderate PHTN PASP 7mHg, aortic root 463m   Exercise myoview stress test  05/01/2019:  Patient exercised on Bruce protocol for 5:00  minutes and achieved  99 % of Max Predicted HR (Target HR was >85% MPHR) and  6.79 METS. Stress symptoms included fatigue and dyspnea. Normal BP response.  Exercise capacity was low. The perfusion imaging study demonstrates mild diaphragmatic attenuation artifact and normal perfusion in all vascular territories. Dynamic gated images reveal normal endocardial thickening and left ventricular systolic function. LVEF calculated was   45% but visually appeared to be normal. This is a low risk study.  US Carotid Bilateral [facility]one 05/01/2015: Normal. Negative for stenosis.    ABI 09/29/2019: This exam reveals normal perfusion of the right lower extremity (ABI 1.19 ) and normal perfusion of the left lower extremity (ABI 1.26).    Sleep Study: Positive for Sleep Apnea; Uses CPAP  Assessment:     ICD-10-CM   1. Ascending aorta dilation (HCC)  I77.810 EKG 12-Lead    PCV ECHOCARDIOGRAM COMPLETE    CT ANGIO CHEST AORTA W/ & OR WO/CM & GATING ([place] ONLY)    carvedilol (COREG) 3.125 MG tablet    spironolactone (ALDACTONE) 25 MG tablet    2. Coronary atherosclerosis due to calcified coronary lesion of native artery  I25.10 PCV ECHOCARDIOGRAM COMPLETE   I25.84     3. Nonrheumatic mitral valve regurgitation  I34.0 PCV ECHOCARDIOGRAM COMPLETE    4. Atherosclerosis of aorta (HCC)  I70.0 PCV ECHOCARDIOGRAM COMPLETE    5. Essential hypertension  I10 amLODipine (NORVASC) 5 MG tablet    PCV ECHOCARDIOGRAM COMPLETE    carvedilol (COREG) 3.125 MG tablet    spironolactone (ALDACTONE) 25 MG tablet    6. OSA on CPAP  G47.33    Z99.89     7. Former smoker  Z87.891     67. Class 2 severe obesity due to excess calories with serious comorbidity and body mass index (BMI) of 38.0 to 38.9 in adult [facility])  E66.01    Z68.38       Recommendations:   Ascending aorta dilation (HCC) Stable. Last CTA chest November 2022 notes stable dilated ascending aorta at 44 mm. Office blood pressures at home blood pressures are very well controlled. Continue current medical therapy. Recommended echocardiogram in 6 months to re-evaluate the aorta.   Coronary atherosclerosis due to calcified coronary lesion of native artery Chest pain-free. EKG nonischemic. Medications reconciled. Prior ischemic work-up reviewed as part of today's medical decision making. Atherosclerosis of aorta (HCC) Continue aspirin and statin therapy.  Essential hypertension Office blood pressures are very well controlled. Home blood pressures are also well controlled; however, after weight loss he has been experiencing lightheaded and dizziness on the current blood pressure regimen.  Decrease amlodipine to 5 mg p.o. daily.  We will have PCM reach out to him in a week-2 weeks to follow-up on his blood pressure trends and how he is feeling clinically.  If he still experiencing lightheaded and dizziness very reasonable to discontinue amlodipine altogether. Refilled Coreg and spironolactone  OSA on CPAP Educated importance of continued compliance with CPAP on daily basis.  Class 2 severe obesity due to excess calories with serious comorbidity and body mass index (BMI) of 38.0 to 38.9 in adult [facility]) Has lost approximately 13 pounds since last office visit-he is congratulated on his efforts. Body mass index is 38.46 kg/m. I reviewed with the patient the importance of diet, regular physical activity/exercise, weight loss.   Patient is educated on increasing physical activity gradually as tolerated.  With the goal of moderate intensity exercise for  30 minutes a day 5 days a week.  Orders Placed This Encounter  Procedures   CT ANGIO CHEST AORTA W/ & OR WO/CM & GATING ([place] ONLY)   EKG 12-Lead   PCV ECHOCARDIOGRAM COMPLETE   --Continue cardiac  medications as reconciled in final medication list. --Return in about 4 months (around 01/10/2022) for Follow up aortic root dilatation. Or sooner if needed. --Continue follow-up with your primary care physician regarding the management of your other chronic comorbid conditions.  Patient's questions and concerns were addressed to his satisfaction. He voices understanding of the instructions provided during this encounter.   This note was created using a voice recognition software as a result there may be grammatical errors inadvertently enclosed that do not reflect the nature of this encounter. Every attempt is made to correct such errors.  Rex Kras, [place], [facility]  Pager: [phone] Office: [phone]

## 2021-09-16 ENCOUNTER — Other Ambulatory Visit (HOSPITAL_COMMUNITY): Payer: Self-pay

## 2021-09-18 ENCOUNTER — Other Ambulatory Visit: Payer: Self-pay | Admitting: Cardiology

## 2021-09-18 DIAGNOSIS — I77819 Aortic ectasia, unspecified site: Secondary | ICD-10-CM

## 2021-09-18 DIAGNOSIS — I10 Essential (primary) hypertension: Secondary | ICD-10-CM

## 2021-10-06 ENCOUNTER — Other Ambulatory Visit: Payer: Self-pay

## 2021-10-06 DIAGNOSIS — I2510 Atherosclerotic heart disease of native coronary artery without angina pectoris: Secondary | ICD-10-CM

## 2021-10-09 ENCOUNTER — Other Ambulatory Visit (HOSPITAL_COMMUNITY): Payer: Self-pay

## 2021-10-09 MED ORDER — OZEMPIC (2 MG/DOSE) 8 MG/3ML ~~LOC~~ SOPN
PEN_INJECTOR | SUBCUTANEOUS | 3 refills | Status: DC
  Filled 2021-10-09: qty 3, 28d supply, fill #0

## 2021-10-10 DIAGNOSIS — I10 Essential (primary) hypertension: Secondary | ICD-10-CM | POA: Diagnosis not present

## 2021-10-17 ENCOUNTER — Other Ambulatory Visit (HOSPITAL_COMMUNITY): Payer: Self-pay

## 2021-10-20 ENCOUNTER — Other Ambulatory Visit (HOSPITAL_COMMUNITY): Payer: Self-pay

## 2021-11-09 DIAGNOSIS — I10 Essential (primary) hypertension: Secondary | ICD-10-CM | POA: Diagnosis not present

## 2021-11-13 DIAGNOSIS — H401211 Low-tension glaucoma, right eye, mild stage: Secondary | ICD-10-CM | POA: Diagnosis not present

## 2021-11-13 DIAGNOSIS — H43813 Vitreous degeneration, bilateral: Secondary | ICD-10-CM | POA: Diagnosis not present

## 2021-11-13 DIAGNOSIS — H35372 Puckering of macula, left eye: Secondary | ICD-10-CM | POA: Diagnosis not present

## 2021-11-13 DIAGNOSIS — H2513 Age-related nuclear cataract, bilateral: Secondary | ICD-10-CM | POA: Diagnosis not present

## 2021-11-17 ENCOUNTER — Other Ambulatory Visit (HOSPITAL_COMMUNITY): Payer: Self-pay

## 2021-11-25 DIAGNOSIS — I10 Essential (primary) hypertension: Secondary | ICD-10-CM | POA: Diagnosis not present

## 2021-11-25 DIAGNOSIS — E7800 Pure hypercholesterolemia, unspecified: Secondary | ICD-10-CM | POA: Diagnosis not present

## 2021-11-25 DIAGNOSIS — K219 Gastro-esophageal reflux disease without esophagitis: Secondary | ICD-10-CM | POA: Diagnosis not present

## 2021-11-25 DIAGNOSIS — G629 Polyneuropathy, unspecified: Secondary | ICD-10-CM | POA: Diagnosis not present

## 2021-11-25 DIAGNOSIS — E118 Type 2 diabetes mellitus with unspecified complications: Secondary | ICD-10-CM | POA: Diagnosis not present

## 2021-12-09 ENCOUNTER — Other Ambulatory Visit: Payer: Medicare Other

## 2021-12-10 DIAGNOSIS — R972 Elevated prostate specific antigen [PSA]: Secondary | ICD-10-CM | POA: Diagnosis not present

## 2021-12-10 DIAGNOSIS — I10 Essential (primary) hypertension: Secondary | ICD-10-CM | POA: Diagnosis not present

## 2021-12-17 ENCOUNTER — Other Ambulatory Visit (HOSPITAL_COMMUNITY): Payer: Self-pay

## 2021-12-17 DIAGNOSIS — R350 Frequency of micturition: Secondary | ICD-10-CM | POA: Diagnosis not present

## 2021-12-17 DIAGNOSIS — N401 Enlarged prostate with lower urinary tract symptoms: Secondary | ICD-10-CM | POA: Diagnosis not present

## 2021-12-17 DIAGNOSIS — R972 Elevated prostate specific antigen [PSA]: Secondary | ICD-10-CM | POA: Diagnosis not present

## 2021-12-17 DIAGNOSIS — N5201 Erectile dysfunction due to arterial insufficiency: Secondary | ICD-10-CM | POA: Diagnosis not present

## 2021-12-18 ENCOUNTER — Ambulatory Visit: Payer: Medicare Other

## 2021-12-18 DIAGNOSIS — I700 Atherosclerosis of aorta: Secondary | ICD-10-CM

## 2021-12-18 DIAGNOSIS — I340 Nonrheumatic mitral (valve) insufficiency: Secondary | ICD-10-CM

## 2021-12-18 DIAGNOSIS — I10 Essential (primary) hypertension: Secondary | ICD-10-CM | POA: Diagnosis not present

## 2021-12-18 DIAGNOSIS — I2584 Coronary atherosclerosis due to calcified coronary lesion: Secondary | ICD-10-CM | POA: Diagnosis not present

## 2021-12-18 DIAGNOSIS — I77810 Thoracic aortic ectasia: Secondary | ICD-10-CM | POA: Diagnosis not present

## 2021-12-18 DIAGNOSIS — I2510 Atherosclerotic heart disease of native coronary artery without angina pectoris: Secondary | ICD-10-CM

## 2022-01-08 ENCOUNTER — Other Ambulatory Visit (HOSPITAL_COMMUNITY): Payer: Self-pay

## 2022-01-08 MED ORDER — OZEMPIC (2 MG/DOSE) 8 MG/3ML ~~LOC~~ SOPN
PEN_INJECTOR | SUBCUTANEOUS | 3 refills | Status: DC
  Filled 2022-01-08: qty 3, 28d supply, fill #0

## 2022-01-09 DIAGNOSIS — I10 Essential (primary) hypertension: Secondary | ICD-10-CM | POA: Diagnosis not present

## 2022-01-20 ENCOUNTER — Encounter: Payer: Self-pay | Admitting: Cardiology

## 2022-01-20 ENCOUNTER — Ambulatory Visit: Payer: Medicare Other | Admitting: Cardiology

## 2022-01-20 VITALS — BP 126/70 | HR 63 | Temp 98.3°F | Resp 14 | Ht 72.0 in | Wt 282.6 lb

## 2022-01-20 DIAGNOSIS — I2510 Atherosclerotic heart disease of native coronary artery without angina pectoris: Secondary | ICD-10-CM | POA: Diagnosis not present

## 2022-01-20 DIAGNOSIS — G4733 Obstructive sleep apnea (adult) (pediatric): Secondary | ICD-10-CM | POA: Diagnosis not present

## 2022-01-20 DIAGNOSIS — Z9989 Dependence on other enabling machines and devices: Secondary | ICD-10-CM | POA: Diagnosis not present

## 2022-01-20 DIAGNOSIS — I2584 Coronary atherosclerosis due to calcified coronary lesion: Secondary | ICD-10-CM | POA: Diagnosis not present

## 2022-01-20 DIAGNOSIS — I700 Atherosclerosis of aorta: Secondary | ICD-10-CM | POA: Diagnosis not present

## 2022-01-20 DIAGNOSIS — I340 Nonrheumatic mitral (valve) insufficiency: Secondary | ICD-10-CM

## 2022-01-20 DIAGNOSIS — I10 Essential (primary) hypertension: Secondary | ICD-10-CM

## 2022-01-20 DIAGNOSIS — E66812 Obesity, class 2: Secondary | ICD-10-CM

## 2022-01-20 DIAGNOSIS — I77810 Thoracic aortic ectasia: Secondary | ICD-10-CM

## 2022-01-20 DIAGNOSIS — Z6838 Body mass index (BMI) 38.0-38.9, adult: Secondary | ICD-10-CM | POA: Diagnosis not present

## 2022-01-20 NOTE — Progress Notes (Signed)
Primary Physician:  Deland Pretty, MD  Patient ID: [name] [name], male    DOB: 12/01/1945, 76 y.o.   MRN: [id number]   Date: 01/20/22 Last Office Visit: 09/10/2021  Subjective:    Chief Complaint  Patient presents with   aortic root dilatation   Follow-up    HPI: [name] [name]  is a 76 y.o. male  whose past medical history is hypertension, lumbar spinal stenosis, obstructive sleep apnea on CPAP, mild ascending aortic aneurysm 4.4 cm (July 2021), advanced age, coronary artery calcification, former smoker, and obesity due to excess calories s/p gastric banding surgery in 2010.   He  is accompanied by his wife at today's office visit.   He has been followed by the practice given his history of a ascending aortic dilatation per CTA of the chest with contrast back in July 2021.  Since last office visit he had an echocardiogram which notes the aortic dimensions to be relatively stable at 43 mm.  He is scheduled for a CT chest again in November 2023.  Home blood pressures are well controlled.  Clinically he is doing well and denies any anginal discomfort or heart failure symptoms.  However, has been experiencing feeling tired and fatigue.  No significant change in overall physical endurance/exercise.   Past Medical History:  Diagnosis Date   Aneurysm, ascending aorta ([place])    Arthritis    Coronary artery disease    pt denies   Difficulty urinating    prostate problem   GERD (gastroesophageal reflux disease)    HIATAL HERNIA REPAIRED -with lap band 2 years ago NO LONGER HAS GERD   History of elevated glucose    IN THE PAST - NO PROBLEMS SINCE GASTRIC BANDING WEIGHT LOSS    History of kidney stones    Hyperlipidemia    Hypertension    resolved with lap band 2 years ago   Pneumonia    Pulmonary hypertension ([place])    per echo report  pt unaware   Sleep apnea    uses cpap setting is 12    Past Surgical History:  Procedure Laterality Date   COLONOSCOPY  07/17/11   CYSTOSCOPY W/  URETERAL STENT PLACEMENT  07/18/2011   Procedure: CYSTOSCOPY WITH STENT REPLACEMENT;  Surgeon: Alexis Frock;  Location: [facility];  Service: Urology;  Laterality: Left;   GASTRIC RESTRICTION SURGERY  12/11/2008   lap band   HEMORRHOID SURGERY N/A 08/04/2013   Procedure: EUA,HEMORRHOIDECTOMY;  Surgeon: [name], MD;  Location: [facility];  Service: General;  Laterality: N/A;   LAPAROSCOPIC GASTRIC BANDING  12/11/2008   LITHOTRIPSY      Social History   Tobacco Use   Smoking status: Former    Packs/day: 2.00    Years: 8.00    Total pack years: 16.00    Types: Cigarettes    Quit date: 04/05/1971    Years since quitting: 50.8   Smokeless tobacco: Former    Types: Snuff  Vaping Use   Vaping Use: Never used  Substance Use Topics   Alcohol use: Yes    Alcohol/week: 0.0 standard drinks of alcohol    Comment: occasional beer   Drug use: Never   Review of Systems  Constitutional: Positive for malaise/fatigue.  Cardiovascular:  Negative for chest pain, dyspnea on exertion, leg swelling, near-syncope, orthopnea, palpitations, paroxysmal nocturnal dyspnea and syncope.  Respiratory:  Negative for shortness of breath.       Objective:  Blood pressure 126/70, pulse 63, temperature 98.3  F (36.8 C), temperature source Temporal, resp. rate 14, height 6' (1.829 m), weight 282 lb 9.6 oz (128.2 kg), SpO2 96 %. Body mass index is 38.33 kg/m.    Physical Exam Vitals reviewed.  Constitutional:      General: He is not in acute distress.    Appearance: He is well-developed.  HENT:     Head: Normocephalic and atraumatic.  Cardiovascular:     Rate and Rhythm: Normal rate and regular rhythm.     Pulses: Intact distal pulses.          Femoral pulses are 2+ on the right side and 2+ on the left side.      Popliteal pulses are 2+ on the right side and 2+ on the left side.       Dorsalis pedis pulses are 1+ on the right side and 1+ on the left side.       Posterior tibial pulses are 2+ on the right  side and 2+ on the left side.     Heart sounds: Normal heart sounds.  Pulmonary:     Effort: Pulmonary effort is normal. No accessory muscle usage or respiratory distress.     Breath sounds: Normal breath sounds.  Abdominal:     General: Bowel sounds are normal.     Palpations: Abdomen is soft.  Musculoskeletal:        General: Normal range of motion.     Cervical back: Normal range of motion.  Skin:    General: Skin is warm and dry.  Neurological:     Mental Status: He is alert and oriented to person, place, and time.  Psychiatric:        Behavior: Behavior normal.    Radiology: CTA chest with contrast 01/29/2020 Stable mild aneurysmal dilatation of the ascending thoracic aorta, maximal diameter 4.4 cm.  No significant interval change.  Negative for acute pulmonary embolus  No other acute intrathoracic finding  Native coronary atherosclerosis  Cholelithiasis  Aortic Atherosclerosis (ICD10-I70.0).  Lungs/Pleura: No focal pneumonia, collapse or consolidation. No acute airspace process or interstitial disease. There are a few scattered subcentimeter punctate calcified granulomas in the lingula, superior segment right lower lobe, and left lower lobe. Stable benign 5 mm subpleural left lower lobe nodule, image 104 series 7. No new or enlarging nodule.  CTA chest aorta with and without contrast  05/30/2021: Vascular:  1. Unchanged fusiform aneurysmal changes of the ascending thoracic aorta measuring up to 4.4 cm. Recommend annual imaging followup by CTA or MRA. This recommendation follows 2010 ACCF/AHA/AATS/ACR/ASA/SCA/SCAI/SIR/STS/SVM Guidelines for the Diagnosis and Management of Patients with Thoracic Aortic Disease. Circulation. 2010; 121: E952-W413. Aortic aneurysm NOS (ICD10-I71.9) 2. Coronary and aortic atherosclerosis (ICD10-I70.0). Non-Vascular: No acute or significant intrathoracic abnormality.  Laboratory examination:   External Labs: Collected: 08/13/2020 provided by  PCP. Creatinine 1 mg/dL. eGFR: 77 mL/min per 1.73 m Sodium 141, potassium 5.9, chloride 103, bicarb 26 AST 18, ALT 23, Lipid profile: Total cholesterol 103, triglycerides 94, HDL 34, non-HDL 51 Hemoglobin A1c: 6  Medications Discontinued During This Encounter  Medication Reason   Semaglutide, 2 MG/DOSE, (OZEMPIC, 2 MG/DOSE,) 8 MG/3ML SOPN    Semaglutide, 2 MG/DOSE, (OZEMPIC, 2 MG/DOSE,) 8 MG/3ML SOPN     Current Meds  Medication Sig   acetaminophen (TYLENOL) 500 MG tablet Take 1,000 mg by mouth every 6 (six) hours as needed for moderate pain.   alfuzosin (UROXATRAL) 10 MG 24 hr tablet Take 10 mg by mouth daily.   amLODipine (NORVASC) 5  MG tablet Take 1 tablet (5 mg total) by mouth every morning.   Ascorbic Acid (VITAMIN C) 500 MG CAPS See admin instructions.   aspirin EC 81 MG tablet Take 81 mg by mouth daily.   carvedilol (COREG) 3.125 MG tablet TAKE 1 TABLET BY MOUTH TWICE A DAY WITH A MEAL   Cholecalciferol (VITAMIN D3) 125 MCG (5000 UT) TABS Take 5,000 Units by mouth daily.    clotrimazole-betamethasone (LOTRISONE) cream Apply 1 application topically 2 (two) times daily.   diphenhydrAMINE HCl, Sleep, (ZZZQUIL) 25 MG CAPS Take 50 mg by mouth at bedtime.   docusate sodium (COLACE) 100 MG capsule Take 200 mg by mouth daily as needed for mild constipation.   gabapentin (NEURONTIN) 300 MG capsule Take 1 capsule by mouth twice daily   irbesartan (AVAPRO) 75 MG tablet TAKE 1 TABLET BY MOUTH EVERY DAY   latanoprost (XALATAN) 0.005 % ophthalmic solution 1 drop at bedtime.   Multiple Vitamin (MULTIVITAMIN) tablet Take 1 tablet by mouth daily.   pantoprazole (PROTONIX) 20 MG tablet Take 20 mg by mouth daily.    rosuvastatin (CRESTOR) 10 MG tablet Take 10 mg by mouth daily.   Semaglutide, 2 MG/DOSE, (OZEMPIC, 2 MG/DOSE,) 8 MG/3ML SOPN Inject 2 mg under the skin once weekly   spironolactone (ALDACTONE) 25 MG tablet TAKE 1 TABLET (25 MG TOTAL) BY MOUTH IN THE MORNING. (Patient taking  differently: Take 12.5 mg by mouth daily. TAKE 1 TABLET (25 MG TOTAL) BY MOUTH IN THE MORNING.)   TURMERIC-GINGER PO Take 1 tablet by mouth daily.   Zinc 50 MG TABS Take 50 mg by mouth daily.     Cardiac Studies:  EKG: 09/10/2021: Sinus bradycardia, 54 bpm, first-degree AV block, without underlying ischemia or injury pattern.  Echocardiogram 12/18/2021:  Left ventricle cavity is normal in size and wall thickness. Normal global wall motion. Normal LV systolic function with EF 53%. Doppler evidence of grade I (impaired) diastolic dysfunction, normal LAP.  The aortic root is dilated, measuring 4 cm at Sinus of Valsalva.  Proximal ascending aorta is dilated at 4.3 cm.  Left atrial cavity is severely dilated.  Right ventricle cavity is mildly dilated. Normal right ventricular function.  Mild (Grade I) mitral regurgitation.  Mild tricuspid regurgitation. Estimated pulmonary artery systolic pressure 32 mmHg.  Mild pulmonic regurgitation.  Personally reviewed and compared study from 2020. Aortic measurements are stable and unchanged. Estimated RVSP is lowed (50 mmHg then, 32 mmHg now), but mild RV dilatation is new.   Exercise myoview stress test  05/01/2019: Patient exercised on Bruce protocol for 5:00  minutes and achieved  99 % of Max Predicted HR (Target HR was >85% MPHR) and  6.79 METS. Stress symptoms included fatigue and dyspnea. Normal BP response.  Exercise capacity was low. The perfusion imaging study demonstrates mild diaphragmatic attenuation artifact and normal perfusion in all vascular territories. Dynamic gated images reveal normal endocardial thickening and left ventricular systolic function. LVEF calculated was   45% but visually appeared to be normal. This is a low risk study.  US Carotid Bilateral Cone 05/01/2015: Normal. Negative for stenosis.    ABI 09/29/2019: This exam reveals normal perfusion of the right lower extremity (ABI 1.19 ) and normal perfusion of the left lower  extremity (ABI 1.26).    Sleep Study: Positive for Sleep Apnea; Uses CPAP  Assessment:     ICD-10-CM   1. Ascending aorta dilation (HCC)  I77.810     2. Essential hypertension  I10  3. Coronary atherosclerosis due to calcified coronary lesion of native artery  I25.10    I25.84     4. Atherosclerosis of aorta (HCC)  I70.0     5. Nonrheumatic mitral valve regurgitation  I34.0     6. OSA on CPAP  G47.33    Z99.89     7. Class 2 severe obesity due to excess calories with serious comorbidity and body mass index (BMI) of 38.0 to 38.9 in adult [facility])  E66.01    Z68.38      Recommendations:   Ascending aorta dilation (HCC) Since last office visit had a 2D echo which notes the aortic dimensions to be relatively stable at 43 mm. Office and home blood pressures are well controlled. Medications reconciled. We will repeat a CT as a 1 year follow-up in November 2023.  Essential hypertension Continue current medical therapy.  Coronary atherosclerosis due to calcified coronary lesion of native artery Denies angina pectoris or heart failure symptoms. Echocardiogram notes low normal LVEF, grade 1 diastolic impairment.  Normal left atrial pressures.  Atherosclerosis of aorta (HCC) Continue aspirin and statin therapy.   OSA on CPAP We educated on the importance of continued compliance with CPAP on a daily basis  Class 2 severe obesity due to excess calories with serious comorbidity and body mass index (BMI) of 38.0 to 38.9 in adult [facility]) Body mass index is 38.33 kg/m. I reviewed with the patient the importance of diet, regular physical activity/exercise, weight loss.   Patient is educated on increasing physical activity gradually as tolerated.  With the goal of moderate intensity exercise for 30 minutes a day 5 days a week.  No orders of the defined types were placed in this encounter.  --Continue cardiac medications as reconciled in final medication list. --Return in about 5  months (around 06/22/2022) for Follow up after CT -history of ascending aortic dilatation.. Or sooner if needed. --Continue follow-up with your primary care physician regarding the management of your other chronic comorbid conditions.  Patient's questions and concerns were addressed to his satisfaction. He voices understanding of the instructions provided during this encounter.   This note was created using a voice recognition software as a result there may be grammatical errors inadvertently enclosed that do not reflect the nature of this encounter. Every attempt is made to correct such errors.  Rex Kras, [place], [facility]  Pager: [phone] Office: [phone]

## 2022-02-09 ENCOUNTER — Other Ambulatory Visit (HOSPITAL_COMMUNITY): Payer: Self-pay

## 2022-02-09 DIAGNOSIS — I10 Essential (primary) hypertension: Secondary | ICD-10-CM | POA: Diagnosis not present

## 2022-02-09 MED ORDER — OZEMPIC (2 MG/DOSE) 8 MG/3ML ~~LOC~~ SOPN
PEN_INJECTOR | SUBCUTANEOUS | 3 refills | Status: DC
  Filled 2022-02-09: qty 3, 28d supply, fill #0
  Filled 2022-03-10: qty 3, 28d supply, fill #1
  Filled 2022-04-06: qty 3, 28d supply, fill #2
  Filled 2022-05-04: qty 3, 28d supply, fill #3
  Filled 2022-06-02: qty 3, 28d supply, fill #4
  Filled 2022-06-30: qty 3, 28d supply, fill #5
  Filled 2022-07-27: qty 3, 28d supply, fill #6
  Filled 2022-08-26 – 2022-08-27 (×2): qty 3, 28d supply, fill #7

## 2022-02-10 ENCOUNTER — Other Ambulatory Visit (HOSPITAL_COMMUNITY): Payer: Self-pay

## 2022-02-12 DIAGNOSIS — H2513 Age-related nuclear cataract, bilateral: Secondary | ICD-10-CM | POA: Diagnosis not present

## 2022-02-12 DIAGNOSIS — H43813 Vitreous degeneration, bilateral: Secondary | ICD-10-CM | POA: Diagnosis not present

## 2022-02-12 DIAGNOSIS — H35372 Puckering of macula, left eye: Secondary | ICD-10-CM | POA: Diagnosis not present

## 2022-02-12 DIAGNOSIS — H401211 Low-tension glaucoma, right eye, mild stage: Secondary | ICD-10-CM | POA: Diagnosis not present

## 2022-02-25 ENCOUNTER — Other Ambulatory Visit: Payer: Self-pay | Admitting: Cardiology

## 2022-02-25 DIAGNOSIS — I10 Essential (primary) hypertension: Secondary | ICD-10-CM

## 2022-02-25 DIAGNOSIS — I77810 Thoracic aortic ectasia: Secondary | ICD-10-CM

## 2022-03-06 DIAGNOSIS — Z113 Encounter for screening for infections with a predominantly sexual mode of transmission: Secondary | ICD-10-CM | POA: Diagnosis not present

## 2022-03-06 DIAGNOSIS — R413 Other amnesia: Secondary | ICD-10-CM | POA: Diagnosis not present

## 2022-03-06 DIAGNOSIS — Z125 Encounter for screening for malignant neoplasm of prostate: Secondary | ICD-10-CM | POA: Diagnosis not present

## 2022-03-06 DIAGNOSIS — I10 Essential (primary) hypertension: Secondary | ICD-10-CM | POA: Diagnosis not present

## 2022-03-06 DIAGNOSIS — E559 Vitamin D deficiency, unspecified: Secondary | ICD-10-CM | POA: Diagnosis not present

## 2022-03-06 DIAGNOSIS — E118 Type 2 diabetes mellitus with unspecified complications: Secondary | ICD-10-CM | POA: Diagnosis not present

## 2022-03-06 DIAGNOSIS — E7800 Pure hypercholesterolemia, unspecified: Secondary | ICD-10-CM | POA: Diagnosis not present

## 2022-03-10 ENCOUNTER — Other Ambulatory Visit (HOSPITAL_COMMUNITY): Payer: Self-pay

## 2022-03-11 ENCOUNTER — Other Ambulatory Visit (HOSPITAL_COMMUNITY): Payer: Self-pay

## 2022-03-11 DIAGNOSIS — K219 Gastro-esophageal reflux disease without esophagitis: Secondary | ICD-10-CM | POA: Diagnosis not present

## 2022-03-11 DIAGNOSIS — Z0000 Encounter for general adult medical examination without abnormal findings: Secondary | ICD-10-CM | POA: Diagnosis not present

## 2022-03-11 DIAGNOSIS — R413 Other amnesia: Secondary | ICD-10-CM | POA: Diagnosis not present

## 2022-03-11 DIAGNOSIS — E559 Vitamin D deficiency, unspecified: Secondary | ICD-10-CM | POA: Diagnosis not present

## 2022-03-11 DIAGNOSIS — E118 Type 2 diabetes mellitus with unspecified complications: Secondary | ICD-10-CM | POA: Diagnosis not present

## 2022-03-11 DIAGNOSIS — I10 Essential (primary) hypertension: Secondary | ICD-10-CM | POA: Diagnosis not present

## 2022-03-11 DIAGNOSIS — I119 Hypertensive heart disease without heart failure: Secondary | ICD-10-CM | POA: Diagnosis not present

## 2022-03-18 ENCOUNTER — Other Ambulatory Visit: Payer: Self-pay | Admitting: Cardiology

## 2022-03-18 DIAGNOSIS — I10 Essential (primary) hypertension: Secondary | ICD-10-CM

## 2022-03-18 DIAGNOSIS — I77819 Aortic ectasia, unspecified site: Secondary | ICD-10-CM

## 2022-03-20 ENCOUNTER — Other Ambulatory Visit: Payer: Self-pay | Admitting: Internal Medicine

## 2022-03-20 ENCOUNTER — Other Ambulatory Visit (HOSPITAL_COMMUNITY): Payer: Self-pay | Admitting: Internal Medicine

## 2022-03-20 DIAGNOSIS — R413 Other amnesia: Secondary | ICD-10-CM

## 2022-04-01 ENCOUNTER — Ambulatory Visit (HOSPITAL_COMMUNITY)
Admission: RE | Admit: 2022-04-01 | Discharge: 2022-04-01 | Disposition: A | Discharge status: Home / Self Care | Payer: Medicare Other | Source: Ambulatory Visit | Attending: Internal Medicine | Admitting: Internal Medicine

## 2022-04-01 DIAGNOSIS — R413 Other amnesia: Secondary | ICD-10-CM | POA: Diagnosis not present

## 2022-04-01 DIAGNOSIS — G319 Degenerative disease of nervous system, unspecified: Secondary | ICD-10-CM | POA: Diagnosis not present

## 2022-04-02 DIAGNOSIS — R413 Other amnesia: Secondary | ICD-10-CM | POA: Diagnosis not present

## 2022-04-02 DIAGNOSIS — E538 Deficiency of other specified B group vitamins: Secondary | ICD-10-CM | POA: Diagnosis not present

## 2022-04-06 ENCOUNTER — Other Ambulatory Visit (HOSPITAL_COMMUNITY): Payer: Self-pay

## 2022-04-10 DIAGNOSIS — I10 Essential (primary) hypertension: Secondary | ICD-10-CM | POA: Diagnosis not present

## 2022-04-18 DIAGNOSIS — Z23 Encounter for immunization: Secondary | ICD-10-CM | POA: Diagnosis not present

## 2022-04-23 ENCOUNTER — Other Ambulatory Visit (HOSPITAL_COMMUNITY): Payer: Self-pay

## 2022-04-23 DIAGNOSIS — R413 Other amnesia: Secondary | ICD-10-CM | POA: Diagnosis not present

## 2022-04-23 DIAGNOSIS — Z23 Encounter for immunization: Secondary | ICD-10-CM | POA: Diagnosis not present

## 2022-04-23 MED ORDER — DONEPEZIL HCL 10 MG PO TABS
ORAL_TABLET | ORAL | 11 refills | Status: DC
  Filled 2022-04-23: qty 30, 30d supply, fill #0
  Filled 2022-07-07: qty 30, 30d supply, fill #1
  Filled 2022-08-17: qty 30, 30d supply, fill #2
  Filled 2022-09-16: qty 30, 30d supply, fill #3
  Filled 2022-10-20: qty 30, 30d supply, fill #4
  Filled 2022-11-23: qty 30, 30d supply, fill #5
  Filled 2022-12-23: qty 30, 30d supply, fill #6
  Filled 2023-02-08: qty 30, 30d supply, fill #7
  Filled 2023-03-09: qty 30, 30d supply, fill #8
  Filled 2023-04-07: qty 30, 30d supply, fill #9

## 2022-05-05 ENCOUNTER — Encounter: Payer: Self-pay | Admitting: *Deleted

## 2022-05-06 ENCOUNTER — Encounter: Payer: Self-pay | Admitting: Diagnostic Neuroimaging

## 2022-05-06 ENCOUNTER — Other Ambulatory Visit (HOSPITAL_COMMUNITY): Payer: Self-pay

## 2022-05-06 ENCOUNTER — Ambulatory Visit (INDEPENDENT_AMBULATORY_CARE_PROVIDER_SITE_OTHER): Payer: Medicare Other | Admitting: Diagnostic Neuroimaging

## 2022-05-06 VITALS — BP 120/79 | HR 63 | Ht 72.0 in | Wt 276.5 lb

## 2022-05-06 DIAGNOSIS — F03B18 Unspecified dementia, moderate, with other behavioral disturbance: Secondary | ICD-10-CM | POA: Diagnosis not present

## 2022-05-06 DIAGNOSIS — I2510 Atherosclerotic heart disease of native coronary artery without angina pectoris: Secondary | ICD-10-CM

## 2022-05-06 DIAGNOSIS — I2584 Coronary atherosclerosis due to calcified coronary lesion: Secondary | ICD-10-CM | POA: Diagnosis not present

## 2022-05-06 NOTE — Patient Instructions (Signed)
MEMORY LOSS (likely mild-moderate dementia with behavioral changes) - may increase to donepezil to '10mg'$  as tolerated; then consider to add memantine '10mg'$  at bedtime; increase to twice a day after 1-2 weeks - safety / supervision issues reviewed - daily physical activity / exercise (at least 15-30 minutes) - eat more plants / vegetables - increase social activities, brain stimulation, games, puzzles, hobbies, crafts, arts, music - aim for at least 7-8 hours sleep per night (or more) - avoid smoking and alcohol - caregiver resources provided - caution with medications, finances; no driving

## 2022-05-06 NOTE — Progress Notes (Signed)
[facility]  PATIENT: [name] [name] DOB: 02-Nov-1945  REFERRING CLINICIAN: Deland Pretty, MD  HISTORY FROM: patient and wife  REASON FOR VISIT: new consult   HISTORICAL  CHIEF COMPLAINT:  Chief Complaint  Patient presents with   Establish Care    Pt reports feeling good. Room 7 with wife [name] [name].    HISTORY OF PRESENT ILLNESS:   UPDATE (05/06/22, VRP): Since last visit, doing better with back pain; had surgery and did wel. Now with new issues in last 12 months,  mainly short term memory issues. Some frustration with memory loss. Some restlessness. No more driving. Still able to handle most personal ADLs, but more challenges outside of home.   UPDATE (11/08/19, VRP): Since last visit, doing well until fall 2020, then worsening left leg pain, numbness, esp when waking up and starting activity. More left low back pain. Concerned about worsening back issues. Tried chiro tx without relief. Using TENS unit. OTC meds not helping. On gabapentin also.    NEW HPI (07/20/18, VRP): 76 year old male with right foot pain / numbness. Since last visit, doing about the same. Symptoms are about the same. Severity is mild-moderate. No alleviating or aggravating factors. Tolerating gabapentin now '100mg'$  twice a day --> started by PCP.  UPDATE 05/21/15: Since last visit, symptoms are stable. Test results reviewed. Still with intermittent numbness and twitching in right foot (sometimes left) esp with prolonged standing or walking.   PRIOR HPI (04/05/15): 76 year old right-handed male here for valuation of right foot numbness and twitching. March 2016 patient had onset of intermittent numbness and involuntary movement of the right foot lasting for 5-10 seconds at a time. He has had 6-10 attacks since March 2016. Symptoms typically affect him when he has been standing upright for a certain period of time. Movement bending walking do not seem to aggravate the symptoms. Symptoms do not affect him  when he is sitting down. He recalls one particularly severe attack in June 2016 when he was shopping with his wife, had significant numbness in the right foot, was concerned about strokelike symptoms and went to the parking lot to sit his car. He also had significant anxiety with this attack and patient is not sure of the contribution of anxiety versus the neurologic symptoms. Symptoms ultimately resolved. He called his PCP to discuss the symptoms. Patient did not go to the emergency room. Patient was referred to me for EMG study on 03/07/15. Study was slightly abnormal demonstrating evidence of right L5-S1 radiculopathies.    REVIEW OF SYSTEMS: Full 14 system review of systems performed and negative except: as per HPI.    ALLERGIES: No Known Allergies  HOME MEDICATIONS: Outpatient Medications Prior to Visit  Medication Sig Dispense Refill   acetaminophen (TYLENOL) 500 MG tablet Take 1,000 mg by mouth every 6 (six) hours as needed for moderate pain.     alfuzosin (UROXATRAL) 10 MG 24 hr tablet Take 10 mg by mouth daily.     amLODipine (NORVASC) 5 MG tablet Take 1 tablet by mouth daily.     Ascorbic Acid (VITAMIN C) 500 MG CAPS See admin instructions.     aspirin EC 81 MG tablet Take 81 mg by mouth daily.     carvedilol (COREG) 3.125 MG tablet TAKE 1 TABLET BY MOUTH TWICE A DAY WITH A MEAL 180 tablet 1   Cholecalciferol (VITAMIN D3) 125 MCG (5000 UT) TABS Take 5,000 Units by mouth daily.      clotrimazole-betamethasone (LOTRISONE) cream Apply  1 application topically 2 (two) times daily.     Cyanocobalamin (B-12) 1000 MCG TABS 1 tablet Orally Once a day for 30 day(s)     docusate sodium (COLACE) 100 MG capsule Take 200 mg by mouth daily as needed for mild constipation.     donepezil (ARICEPT) 10 MG tablet Take 1 tablet by mouth once daily at bedtime. (Patient taking differently: 5 mg.) 30 tablet 11   irbesartan (AVAPRO) 75 MG tablet TAKE 1 TABLET BY MOUTH EVERY DAY 90 tablet 1   latanoprost  (XALATAN) 0.005 % ophthalmic solution 1 drop at bedtime.     Multiple Vitamin (MULTIVITAMIN) tablet Take 1 tablet by mouth daily.     pantoprazole (PROTONIX) 20 MG tablet Take 20 mg by mouth daily.      rosuvastatin (CRESTOR) 10 MG tablet Take 10 mg by mouth daily.     Semaglutide, 2 MG/DOSE, (OZEMPIC, 2 MG/DOSE,) 8 MG/3ML SOPN Inject 2 mg under the skin once weekly 3 mL 3   Semaglutide, 2 MG/DOSE, (OZEMPIC, 2 MG/DOSE,) 8 MG/3ML SOPN Inject 2 mg into the skin once weekly. 9 mL 3   spironolactone (ALDACTONE) 25 MG tablet TAKE 1 TABLET (25 MG TOTAL) BY MOUTH IN THE MORNING (Patient taking differently: 12.5 mg. TAKE 1 TABLET (25 MG TOTAL) BY MOUTH IN THE MORNING.) 90 tablet 0   TURMERIC-GINGER PO Take 1 tablet by mouth daily.     Zinc 50 MG TABS Take 50 mg by mouth daily.      diphenhydrAMINE HCl, Sleep, (ZZZQUIL) 25 MG CAPS Take 50 mg by mouth at bedtime. (Patient not taking: Reported on 05/06/2022)     gabapentin (NEURONTIN) 300 MG capsule Take 1 capsule by mouth twice daily (Patient not taking: Reported on 05/06/2022) 180 capsule 3   amLODipine (NORVASC) 5 MG tablet Take 1 tablet (5 mg total) by mouth every morning. 90 tablet 0   No facility-administered medications prior to visit.    PAST MEDICAL HISTORY: Past Medical History:  Diagnosis Date   Aneurysm, ascending aorta (HCC)    Arthritis    Coronary artery disease    pt denies   Diabetes ([place])    type 2   Difficulty urinating    prostate problem   GERD (gastroesophageal reflux disease)    HIATAL HERNIA REPAIRED -with lap band 2 years ago NO LONGER HAS GERD   History of elevated glucose    IN THE PAST - NO PROBLEMS SINCE GASTRIC BANDING WEIGHT LOSS    History of kidney stones    Hyperlipidemia    Hypertension    resolved with lap band 2 years ago   Memory loss    Pneumonia    Pulmonary hypertension ([place])    per echo report  pt unaware   Sleep apnea    uses cpap setting is 12    PAST SURGICAL HISTORY: Past Surgical History:   Procedure Laterality Date   COLONOSCOPY  07/17/11   CYSTOSCOPY W/ URETERAL STENT PLACEMENT  07/18/2011   Procedure: CYSTOSCOPY WITH STENT REPLACEMENT;  Surgeon: Alexis Frock;  Location: [facility];  Service: Urology;  Laterality: Left;   GASTRIC RESTRICTION SURGERY  12/11/2008   lap band   HEMORRHOID SURGERY N/A 08/04/2013   Procedure: EUA,HEMORRHOIDECTOMY;  Surgeon: [name], MD;  Location: [facility];  Service: General;  Laterality: N/A;   LAPAROSCOPIC GASTRIC BANDING  12/11/2008   LITHOTRIPSY      FAMILY HISTORY: Family History  Problem Relation Age of Onset   Cancer Mother  ovarian   Diabetes Mother    Dementia Mother    Other Father        MVA   Healthy Brother     SOCIAL HISTORY:  Social History   Socioeconomic History   Marital status: Married    Spouse name: [name] [name]   Number of children: 2   Years of education: 12   Highest education level: Not on file  Occupational History    Comment: retired, [employer] pipe line  Tobacco Use   Smoking status: Former    Packs/day: 2.00    Years: 8.00    Total pack years: 16.00    Types: Cigarettes    Quit date: 04/05/1971    Years since quitting: 51.1   Smokeless tobacco: Former    Types: Snuff  Vaping Use   Vaping Use: Never used  Substance and Sexual Activity   Alcohol use: Yes    Alcohol/week: 0.0 standard drinks of alcohol    Comment: occasional beer   Drug use: Never   Sexual activity: Not Currently  Other Topics Concern   Not on file  Social History Narrative   Married, lives at home with wife, [name] [name].  Children 2.  Education 12th grade.  Reitired.     Caffeine use- coffee 1 cups daily   Social Determinants of Health   Financial Resource Strain: Not on file  Food Insecurity: Not on file  Transportation Needs: Not on file  Physical Activity: Not on file  Stress: Not on file  Social Connections: Not on file  Intimate Partner Violence: Not on file     PHYSICAL EXAM  GENERAL  EXAM/CONSTITUTIONAL: Vitals:  Vitals:   05/06/22 0844  BP: 120/79  Pulse: 63  Weight: 276 lb 8 oz (125.4 kg)  Height: 6' (1.829 m)   Body mass index is 37.5 kg/m.  Wt Readings from Last 3 Encounters:  05/06/22 276 lb 8 oz (125.4 kg)  01/20/22 282 lb 9.6 oz (128.2 kg)  09/10/21 283 lb 9.6 oz (128.6 kg)    No results found. Patient is in no distress; well developed, nourished and groomed; neck is supple   CARDIOVASCULAR: Examination of carotid arteries is normal; no carotid bruits Regular rate and rhythm, no murmurs Examination of peripheral vascular system by observation and palpation is normal  EYES: Ophthalmoscopic exam of optic discs and posterior segments is normal; no papilledema or hemorrhages  MUSCULOSKELETAL: Gait, strength, tone, movements noted in Neurologic exam below  NEUROLOGIC: MENTAL STATUS:     05/06/2022    8:47 AM  MMSE - Mini Mental State Exam  Orientation to time 2  Orientation to Place 2  Registration 2  Attention/ Calculation 1  Recall 1  Language- name 2 objects 2  Language- repeat 0  Language- follow 3 step command 3  Language- read & follow direction 1  Write a sentence 1  Copy design 0  Total score 15   awake, alert, oriented to person Decr memory Slightly tangential  CRANIAL NERVE:  2nd - no papilledema on fundoscopic exam 2nd, 3rd, 4th, 6th - pupils equal and reactive to light, visual fields full to confrontation, extraocular muscles intact, no nystagmus 5th - facial sensation symmetric 7th - facial strength symmetric 8th - hearing intact 9th - palate elevates symmetrically, uvula midline 11th - shoulder shrug symmetric 12th - tongue protrusion midline  MOTOR:  normal bulk and tone, full strength in the BUE, BLE  SENSORY:  normal and symmetric to light touch, temperature, vibration  COORDINATION:  finger-nose-finger, fine finger movements normal  REFLEXES:  deep tendon reflexes TRACE and symmetric  GAIT/STATION:   narrow based gait; USING CANE    DIAGNOSTIC DATA (LABS, IMAGING, TESTING) - I reviewed patient records, labs, notes, testing and imaging myself where available.  Lab Results  Component Value Date   WBC 6.7 05/16/2020   HGB 12.9 (L) 05/16/2020   HCT 39.3 05/16/2020   MCV 99.5 05/16/2020   PLT 213 05/16/2020      Component Value Date/Time   NA 137 05/19/2021 1338   K 5.2 05/19/2021 1338   CL 101 05/19/2021 1338   CO2 23 05/19/2021 1338   GLUCOSE 104 (H) 05/19/2021 1338   GLUCOSE 116 (H) 05/16/2020 1038   BUN 10 05/19/2021 1338   CREATININE 0.81 05/19/2021 1338   CALCIUM 9.4 05/19/2021 1338   PROT 7.5 12/06/2008 1006   ALBUMIN 4.2 12/06/2008 1006   AST 23 12/06/2008 1006   ALT 26 12/06/2008 1006   ALKPHOS 40 12/06/2008 1006   BILITOT 0.6 12/06/2008 1006   GFRNONAA >60 05/16/2020 1038   GFRAA 103 03/29/2020 0841   No results found for: "CHOL", "HDL", "LDLCALC", "LDLDIRECT", "TRIG", "CHOLHDL" No results found for: "HGBA1C" No results found for: "VITAMINB12" No results found for: "TSH"   03/08/15 EMG/NCS 1. Electrodiagnostic evidence of right L5, S1 radiculopathies. 2. Bilateral lower lumbar root irritation noted on paraspinal needle EMG.  05/10/15 MRI lumbar spine [I reviewed images myself and agree with interpretation. At L4-5 the leftward disc herniation is likely asymptomatic. However I think he is symptomatic from spinal stenosis at L3-4. -VRP]  1. At L4-L5, there is a large left disc herniation that feels the lateral recess and severe loss of disc height, endplate spurring, moderate facet hypertrophy.  There is compression of the left L5 nerve root there is distorsion of the left S1 nerve root.   Additionally, there is some lesser encroachment upon the exiting L4 and the right L5 nerve root. 2.  At L3-L4, there is moderate spinal stenosis, transverse more than AP, and severe facet hypertrophy causing mild anterolisthesis. There is no definite nerve root compression  though there is some encroachment upon the left L3 and left L4 nerve roots 3. Moderate degenerative changes at the other lumbar levels do not lead to encroachment or compression of the nerve roots. 4. The L5 vertebral body appears to be sacralized. This can be confirmed on plain x-rays.  05/01/15 carotid u/s  - no stenosis  04/29/15 MRI brain (without) demonstrating: 1. Scattered periventricular and subcortical chronic small vessel ischemic disease. 2. No acute findings.  04/29/15 EEG - normal  11/19/19 MRI lumbar spine (without) demonstrating: - At L5-S1: posterior and left disc herniation, facet hypertrophy with moderate-severe spinal stenosis, left lateral recess stenosis, mild right and severe left foraminal stenosis. - At L4-5: disc bulging and facet arthropathy with severe spinal stenosis and moderate-severe left foraminal stenosis. - Compared to MRI from 05/10/15, slight worsening of degenerative spine disease at L4-5 and L5-S1.  - Sacralization of L5 with transitional features.  Small S1-2 intervertebral disc space noted.  Correlate with plain film x-ray if intervention or surgery is planned.  04/01/22 MRI brain [I reviewed images myself and agree with interpretation. -VRP]  1. Progressive atrophy since 2016. Atrophy most prominent in the temporal lobe and hippocampus bilaterally. 2. Negative for acute infarct. Chronic microvascular ischemic change in the white matter.    ASSESSMENT AND PLAN  76 y.o. year old male here with :   Dx:  Moderate dementia with other behavioral disturbance, unspecified dementia type ([place])    PLAN:  MEMORY LOSS (likely moderate dementia with mild behavioral changes) - may increase to donepezil to '10mg'$  as tolerated; then consider to add memantine '10mg'$  at bedtime; increase to twice a day after 1-2 weeks - safety / supervision issues reviewed - daily physical activity / exercise (at least 15-30 minutes) - eat more plants / vegetables - increase  social activities, brain stimulation, games, puzzles, hobbies, crafts, arts, music - aim for at least 7-8 hours sleep per night (or more) - avoid smoking and alcohol - caregiver resources provided - caution with medications, finances; no driving  Return for pending if symptoms worsen or fail to improve, pending test results.    Penni Bombard, MD 77/10/1421, 9:53 AM Certified in Neurology, Neurophysiology and Neuroimaging  [facility] [address], [place], [place] [address] [phone]

## 2022-05-11 DIAGNOSIS — I10 Essential (primary) hypertension: Secondary | ICD-10-CM | POA: Diagnosis not present

## 2022-05-15 ENCOUNTER — Other Ambulatory Visit: Payer: Self-pay | Admitting: Cardiology

## 2022-05-15 DIAGNOSIS — I77810 Thoracic aortic ectasia: Secondary | ICD-10-CM

## 2022-05-15 DIAGNOSIS — I10 Essential (primary) hypertension: Secondary | ICD-10-CM

## 2022-05-21 DIAGNOSIS — H43813 Vitreous degeneration, bilateral: Secondary | ICD-10-CM | POA: Diagnosis not present

## 2022-05-21 DIAGNOSIS — H35372 Puckering of macula, left eye: Secondary | ICD-10-CM | POA: Diagnosis not present

## 2022-05-21 DIAGNOSIS — H2513 Age-related nuclear cataract, bilateral: Secondary | ICD-10-CM | POA: Diagnosis not present

## 2022-05-21 DIAGNOSIS — H401211 Low-tension glaucoma, right eye, mild stage: Secondary | ICD-10-CM | POA: Diagnosis not present

## 2022-05-28 ENCOUNTER — Other Ambulatory Visit: Payer: Self-pay | Admitting: Cardiology

## 2022-05-28 DIAGNOSIS — I10 Essential (primary) hypertension: Secondary | ICD-10-CM

## 2022-05-28 DIAGNOSIS — I77810 Thoracic aortic ectasia: Secondary | ICD-10-CM

## 2022-06-03 ENCOUNTER — Other Ambulatory Visit (HOSPITAL_COMMUNITY): Payer: Self-pay

## 2022-06-04 ENCOUNTER — Ambulatory Visit: Payer: Medicare Other | Admitting: Diagnostic Neuroimaging

## 2022-06-10 DIAGNOSIS — I10 Essential (primary) hypertension: Secondary | ICD-10-CM | POA: Diagnosis not present

## 2022-06-22 ENCOUNTER — Encounter: Payer: Self-pay | Admitting: Cardiology

## 2022-06-22 ENCOUNTER — Ambulatory Visit: Payer: Medicare Other | Admitting: Cardiology

## 2022-06-22 VITALS — BP 108/67 | HR 62 | Resp 16 | Ht 72.0 in | Wt 265.0 lb

## 2022-06-22 DIAGNOSIS — I77810 Thoracic aortic ectasia: Secondary | ICD-10-CM

## 2022-06-22 DIAGNOSIS — I10 Essential (primary) hypertension: Secondary | ICD-10-CM | POA: Diagnosis not present

## 2022-06-22 DIAGNOSIS — I700 Atherosclerosis of aorta: Secondary | ICD-10-CM | POA: Diagnosis not present

## 2022-06-22 DIAGNOSIS — I340 Nonrheumatic mitral (valve) insufficiency: Secondary | ICD-10-CM

## 2022-06-22 DIAGNOSIS — I2510 Atherosclerotic heart disease of native coronary artery without angina pectoris: Secondary | ICD-10-CM

## 2022-06-22 DIAGNOSIS — I2584 Coronary atherosclerosis due to calcified coronary lesion: Secondary | ICD-10-CM | POA: Diagnosis not present

## 2022-06-22 DIAGNOSIS — Z6835 Body mass index (BMI) 35.0-35.9, adult: Secondary | ICD-10-CM | POA: Diagnosis not present

## 2022-06-22 DIAGNOSIS — G4733 Obstructive sleep apnea (adult) (pediatric): Secondary | ICD-10-CM | POA: Diagnosis not present

## 2022-06-22 NOTE — Progress Notes (Signed)
Primary Physician:  Deland Pretty, MD  Patient ID: [name] [name], male    DOB: Sep 03, 1945, 76 y.o.   MRN: [id number]   Date: 06/22/22 Last Office Visit: 01/20/2022  Subjective:    Chief Complaint  Patient presents with   Follow-up    6 month    HPI: [name] [name]  is a 76 y.o. male  whose past medical history is hypertension, lumbar spinal stenosis, obstructive sleep apnea on CPAP, mild ascending aortic aneurysm 4.4 cm (July 2021), advanced age, coronary artery calcification, former smoker, and obesity due to excess calories s/p gastric banding surgery in 2010.   He  is accompanied by his wife at today's office visit.   Patient presents today for 28-monthfollow-up visit given his history of dilated ascending aorta.  At the last office visit he was stable from a cardiovascular standpoint.  Based on radiology recommendations the shared decision was to proceed with a CT in November 2023 to reevaluate the aortic dimensions.  However, the CT is still pending.  His last echocardiogram from June 2023 noted the aortic root to be 40 mm in the proximal ascending aorta to be 43 mm.  Since last office visit patient has been started on Ozempic and has stopped snacking which has resulted in 17 pounds of weight loss.  He is congratulated through his weight loss journey.  His home blood pressures are well-controlled according to his wife.  SBP is usually <130 mmHg.  He denies anginal discomfort or heart failure symptoms.   Past Medical History:  Diagnosis Date   Aneurysm, ascending aorta (HCC)    Arthritis    Coronary artery disease    pt denies   Diabetes ([place]    type 2   Difficulty urinating    prostate problem   GERD (gastroesophageal reflux disease)    HIATAL HERNIA REPAIRED -with lap band 2 years ago NO LONGER HAS GERD   History of elevated glucose    IN THE PAST - NO PROBLEMS SINCE GASTRIC BANDING WEIGHT LOSS    History of kidney stones    Hyperlipidemia    Hypertension     resolved with lap band 2 years ago   Memory loss    Pneumonia    Pulmonary hypertension ([facility]    per echo report  pt unaware   Sleep apnea    uses cpap setting is 12    Past Surgical History:  Procedure Laterality Date   COLONOSCOPY  07/17/11   CYSTOSCOPY W/ URETERAL STENT PLACEMENT  07/18/2011   Procedure: CYSTOSCOPY WITH STENT REPLACEMENT;  Surgeon: TAlexis Frock  Location: [facility];  Service: Urology;  Laterality: Left;   GASTRIC RESTRICTION SURGERY  12/11/2008   lap band   HEMORRHOID SURGERY N/A 08/04/2013   Procedure: EUA,HEMORRHOIDECTOMY;  Surgeon: MPedro Earls MD;  Location: [facility];  Service: General;  Laterality: N/A;   LAPAROSCOPIC GASTRIC BANDING  12/11/2008   LITHOTRIPSY      Social History   Tobacco Use   Smoking status: Former    Packs/day: 2.00    Years: 8.00    Total pack years: 16.00    Types: Cigarettes    Quit date: 04/05/1971    Years since quitting: 51.2   Smokeless tobacco: Former    Types: Snuff  Vaping Use   Vaping Use: Never used  Substance Use Topics   Alcohol use: Yes    Alcohol/week: 0.0 standard drinks of alcohol    Comment: occasional beer   Drug  use: Never   Review of Systems  Constitutional: Positive for weight loss.  Cardiovascular:  Negative for chest pain, dyspnea on exertion, leg swelling, near-syncope, orthopnea, palpitations, paroxysmal nocturnal dyspnea and syncope.  Respiratory:  Negative for shortness of breath.       Objective:  Blood pressure 108/67, pulse 62, resp. rate 16, height 6' (1.829 m), weight 265 lb (120.2 kg), SpO2 96 %. Body mass index is 35.94 kg/m.    Physical Exam Vitals reviewed.  Constitutional:      General: He is not in acute distress.    Appearance: He is well-developed.  HENT:     Head: Normocephalic and atraumatic.  Cardiovascular:     Rate and Rhythm: Normal rate and regular rhythm.     Pulses: Intact distal pulses.          Femoral pulses are 2+ on the right side and 2+ on the left side.       Popliteal pulses are 2+ on the right side and 2+ on the left side.       Dorsalis pedis pulses are 1+ on the right side and 1+ on the left side.       Posterior tibial pulses are 2+ on the right side and 2+ on the left side.     Heart sounds: Normal heart sounds.  Pulmonary:     Effort: Pulmonary effort is normal. No accessory muscle usage or respiratory distress.     Breath sounds: Normal breath sounds.  Abdominal:     General: Bowel sounds are normal.     Palpations: Abdomen is soft.  Musculoskeletal:        General: Normal range of motion.     Cervical back: Normal range of motion.  Skin:    General: Skin is warm and dry.  Neurological:     Mental Status: He is alert and oriented to person, place, and time.  Psychiatric:        Behavior: Behavior normal.    Radiology: CTA chest with contrast 01/29/2020 Stable mild aneurysmal dilatation of the ascending thoracic aorta, maximal diameter 4.4 cm.  No significant interval change.  Negative for acute pulmonary embolus  No other acute intrathoracic finding  Native coronary atherosclerosis  Cholelithiasis  Aortic Atherosclerosis (ICD10-I70.0).  Lungs/Pleura: No focal pneumonia, collapse or consolidation. No acute airspace process or interstitial disease. There are a few scattered subcentimeter punctate calcified granulomas in the lingula, superior segment right lower lobe, and left lower lobe. Stable benign 5 mm subpleural left lower lobe nodule, image 104 series 7. No new or enlarging nodule.  CTA chest aorta with and without contrast  05/30/2021: Vascular:  1. Unchanged fusiform aneurysmal changes of the ascending thoracic aorta measuring up to 4.4 cm. Recommend annual imaging followup by CTA or MRA. This recommendation follows 2010 ACCF/AHA/AATS/ACR/ASA/SCA/SCAI/SIR/STS/SVM Guidelines for the Diagnosis and Management of Patients with Thoracic Aortic Disease. Circulation. 2010; 121: E174-Y814. Aortic aneurysm NOS (ICD10-I71.9) 2.  Coronary and aortic atherosclerosis (ICD10-I70.0). Non-Vascular: No acute or significant intrathoracic abnormality.  Laboratory examination:   External Labs: Collected: 08/13/2020 provided by PCP. Creatinine 1 mg/dL. eGFR: 77 mL/min per 1.73 m Sodium 141, potassium 5.9, chloride 103, bicarb 26 AST 18, ALT 23, Lipid profile: Total cholesterol 103, triglycerides 94, HDL 34, non-HDL 51 Hemoglobin A1c: 6  Medications Discontinued During This Encounter  Medication Reason   diphenhydrAMINE HCl, Sleep, (ZZZQUIL) 25 MG CAPS Patient Preference   gabapentin (NEURONTIN) 300 MG capsule Patient Preference    Current Meds  Medication  Sig   acetaminophen (TYLENOL) 500 MG tablet Take 1,000 mg by mouth every 6 (six) hours as needed for moderate pain.   alfuzosin (UROXATRAL) 10 MG 24 hr tablet Take 10 mg by mouth daily.   amLODipine (NORVASC) 5 MG tablet Take 1 tablet by mouth daily.   Ascorbic Acid (VITAMIN C) 500 MG CAPS See admin instructions.   aspirin EC 81 MG tablet Take 81 mg by mouth daily.   carvedilol (COREG) 3.125 MG tablet TAKE 1 TABLET BY MOUTH TWICE A DAY WITH MEALS   Cholecalciferol (VITAMIN D3) 125 MCG (5000 UT) TABS Take 5,000 Units by mouth daily.    clotrimazole-betamethasone (LOTRISONE) cream Apply 1 application topically 2 (two) times daily.   Cyanocobalamin (B-12) 1000 MCG TABS 1 tablet Orally Once a day for 30 day(s)   docusate sodium (COLACE) 100 MG capsule Take 200 mg by mouth daily as needed for mild constipation.   donepezil (ARICEPT) 10 MG tablet Take 1 tablet by mouth once daily at bedtime. (Patient taking differently: 5 mg.)   irbesartan (AVAPRO) 75 MG tablet TAKE 1 TABLET BY MOUTH EVERY DAY   latanoprost (XALATAN) 0.005 % ophthalmic solution 1 drop at bedtime.   Multiple Vitamin (MULTIVITAMIN) tablet Take 1 tablet by mouth daily.   pantoprazole (PROTONIX) 20 MG tablet Take 20 mg by mouth daily.    rosuvastatin (CRESTOR) 10 MG tablet Take 10 mg by mouth daily.    Semaglutide, 2 MG/DOSE, (OZEMPIC, 2 MG/DOSE,) 8 MG/3ML SOPN Inject 2 mg under the skin once weekly   Semaglutide, 2 MG/DOSE, (OZEMPIC, 2 MG/DOSE,) 8 MG/3ML SOPN Inject 2 mg into the skin once weekly.   spironolactone (ALDACTONE) 25 MG tablet TAKE 1 TABLET (25 MG TOTAL) BY MOUTH IN THE MORNING (Patient taking differently: Take 12.5 mg by mouth once. TAKE 1 TABLET (25 MG TOTAL) BY MOUTH IN THE MORNING.)   TURMERIC-GINGER PO Take 1 tablet by mouth daily.   Zinc 50 MG TABS Take 50 mg by mouth daily.     Cardiac Studies:  EKG: 09/10/2021: Sinus bradycardia, 54 bpm, first-degree AV block, without underlying ischemia or injury pattern. 06/22/2022: Sinus bradycardia, 59 bpm, left axis, first-degree AV block, without underlying injury pattern.  Echocardiogram 12/18/2021:  Left ventricle cavity is normal in size and wall thickness. Normal global wall motion. Normal LV systolic function with EF 53%. Doppler evidence of grade I (impaired) diastolic dysfunction, normal LAP.  The aortic root is dilated, measuring 4 cm at Sinus of Valsalva.  Proximal ascending aorta is dilated at 4.3 cm.  Left atrial cavity is severely dilated.  Right ventricle cavity is mildly dilated. Normal right ventricular function.  Mild (Grade I) mitral regurgitation.  Mild tricuspid regurgitation. Estimated pulmonary artery systolic pressure 32 mmHg.  Mild pulmonic regurgitation.  Personally reviewed and compared study from 2020. Aortic measurements are stable and unchanged. Estimated RVSP is lowed (50 mmHg then, 32 mmHg now), but mild RV dilatation is new.   Exercise myoview stress test  05/01/2019: Patient exercised on Bruce protocol for 5:00  minutes and achieved  99 % of Max Predicted HR (Target HR was >85% MPHR) and  6.79 METS. Stress symptoms included fatigue and dyspnea. Normal BP response.  Exercise capacity was low. The perfusion imaging study demonstrates mild diaphragmatic attenuation artifact and normal perfusion in all  vascular territories. Dynamic gated images reveal normal endocardial thickening and left ventricular systolic function. LVEF calculated was   45% but visually appeared to be normal. This is a low risk study.  US Carotid Bilateral Cone 05/01/2015: Normal. Negative for stenosis.    ABI 09/29/2019: This exam reveals normal perfusion of the right lower extremity (ABI 1.19 ) and normal perfusion of the left lower extremity (ABI 1.26).    Sleep Study: Positive for Sleep Apnea; Uses CPAP  Assessment:     ICD-10-CM   1. Ascending aorta dilation (HCC)  I77.810 EKG 27-SSQS    Basic metabolic panel    2. Essential hypertension  I10     3. Coronary atherosclerosis due to calcified coronary lesion of native artery  I25.10    I25.84     4. Atherosclerosis of aorta (HCC)  I70.0     5. Nonrheumatic mitral valve regurgitation  I34.0     6. OSA on CPAP  G47.33     7. Class 2 severe obesity due to excess calories with serious comorbidity and body mass index (BMI) of 35.0 to 35.9 in adult [facility])  E66.01    Z68.35      Recommendations:   Ascending aorta dilation (HCC) Chronic. CTA chest 05/30/2021: 44 mm ascending thoracic aorta. Echo June 2023: Sinus of Valsalva 40 mm and proximal ascending aorta 43 mm Home blood pressures are well-controlled Medications reconciled. Has lost 17 pounds since last office visit for which she is congratulated for. Would like to proceed with the originally scheduled CT of the chest to reevaluate the aortic dimensions. Will check BMP.  Essential hypertension Office and home blood pressure is well-controlled.  Medications reconciled. No changes warranted at this time. If his home BP is less than 120 mmHG consistently would recommend holding norvasc.   Coronary atherosclerosis due to calcified coronary lesion of native artery / Atherosclerosis of aorta Denies angina pectoris. Prior workup reviewed as part of today's office visit. No additional testing warranted at  this time.  OSA on CPAP Re emphasized the importance of CPAP compliance.  Class 2 severe obesity due to excess calories with serious comorbidity and body mass index (BMI) of 35.0 to 35.9 in adult [facility]) Body mass index is 35.94 kg/m. I reviewed with the patient the importance of diet, regular physical activity/exercise, weight loss.   Patient is educated on increasing physical activity gradually as tolerated.  With the goal of moderate intensity exercise for 30 minutes a day 5 days a week.   Orders Placed This Encounter  Procedures   Basic metabolic panel   EKG [id number]    --Continue cardiac medications as reconciled in final medication list. --Return in about 6 months (around 12/22/2022) for Follow up dilatation of the proximal ascending aorta. Or sooner if needed. --Continue follow-up with your primary care physician regarding the management of your other chronic comorbid conditions.  Patient's questions and concerns were addressed to his satisfaction. He voices understanding of the instructions provided during this encounter.   This note was created using a voice recognition software as a result there may be grammatical errors inadvertently enclosed that do not reflect the nature of this encounter. Every attempt is made to correct such errors.  [name] [name], [place], [facility]  Pager: [phone] Office: [phone]

## 2022-06-23 ENCOUNTER — Other Ambulatory Visit (HOSPITAL_COMMUNITY): Payer: Self-pay

## 2022-06-23 DIAGNOSIS — R413 Other amnesia: Secondary | ICD-10-CM | POA: Diagnosis not present

## 2022-06-23 MED ORDER — MEMANTINE HCL 10 MG PO TABS
10.0000 mg | ORAL_TABLET | Freq: Two times a day (BID) | ORAL | 11 refills | Status: DC
  Filled 2022-06-23: qty 60, 30d supply, fill #0
  Filled 2022-08-17: qty 60, 30d supply, fill #1
  Filled 2022-09-16: qty 60, 30d supply, fill #2
  Filled 2022-10-15: qty 60, 30d supply, fill #3
  Filled 2022-11-23: qty 60, 30d supply, fill #4
  Filled 2022-12-23: qty 60, 30d supply, fill #5
  Filled 2023-01-26: qty 60, 30d supply, fill #6
  Filled 2023-02-25: qty 60, 30d supply, fill #7
  Filled 2023-03-29: qty 60, 30d supply, fill #8
  Filled 2023-04-27: qty 60, 30d supply, fill #9
  Filled 2023-05-25: qty 60, 30d supply, fill #10

## 2022-06-30 ENCOUNTER — Other Ambulatory Visit (HOSPITAL_COMMUNITY): Payer: Self-pay

## 2022-06-30 DIAGNOSIS — I77810 Thoracic aortic ectasia: Secondary | ICD-10-CM | POA: Diagnosis not present

## 2022-07-01 ENCOUNTER — Telehealth: Payer: Self-pay

## 2022-07-01 LAB — BASIC METABOLIC PANEL
BUN/Creatinine Ratio: 15 (ref 10–24)
BUN: 11 mg/dL (ref 8–27)
CO2: 20 mmol/L (ref 20–29)
Calcium: 8.9 mg/dL (ref 8.6–10.2)
Chloride: 100 mmol/L (ref 96–106)
Creatinine, Ser: 0.71 mg/dL — ABNORMAL LOW (ref 0.76–1.27)
Glucose: 98 mg/dL (ref 70–99)
Potassium: 4.8 mmol/L (ref 3.5–5.2)
Sodium: 135 mmol/L (ref 134–144)
eGFR: 94 mL/min/{1.73_m2} (ref >59)

## 2022-07-01 NOTE — Telephone Encounter (Signed)
Left results on VM and was told to call back if he had any further questions.

## 2022-07-01 NOTE — Telephone Encounter (Signed)
-----   Message from [place], [place] sent at 07/01/2022  9:36 AM EST ----- Renal function is at baseline. Proceed with CT scan.   Sunit Clearbrook, DO, [facility]

## 2022-07-03 ENCOUNTER — Ambulatory Visit (HOSPITAL_COMMUNITY)
Admission: RE | Admit: 2022-07-03 | Discharge: 2022-07-03 | Disposition: A | Discharge status: Home / Self Care | Payer: Medicare Other | Source: Ambulatory Visit | Attending: Cardiology | Admitting: Cardiology

## 2022-07-03 DIAGNOSIS — I77810 Thoracic aortic ectasia: Secondary | ICD-10-CM | POA: Diagnosis not present

## 2022-07-03 DIAGNOSIS — J8410 Pulmonary fibrosis, unspecified: Secondary | ICD-10-CM | POA: Diagnosis not present

## 2022-07-03 MED ORDER — IOHEXOL 350 MG/ML SOLN
75.0000 mL | Freq: Once | INTRAVENOUS | Status: AC | PRN
  Administered 2022-07-03: 75 mL via INTRAVENOUS

## 2022-07-07 ENCOUNTER — Other Ambulatory Visit: Payer: Self-pay | Admitting: Cardiology

## 2022-07-07 DIAGNOSIS — I700 Atherosclerosis of aorta: Secondary | ICD-10-CM

## 2022-07-07 DIAGNOSIS — I2510 Atherosclerotic heart disease of native coronary artery without angina pectoris: Secondary | ICD-10-CM

## 2022-07-07 DIAGNOSIS — I10 Essential (primary) hypertension: Secondary | ICD-10-CM

## 2022-07-07 DIAGNOSIS — I77819 Aortic ectasia, unspecified site: Secondary | ICD-10-CM

## 2022-07-07 NOTE — Progress Notes (Signed)
Left VM for pt to call back for results

## 2022-07-09 NOTE — Progress Notes (Signed)
Called patient, NA, LMAM

## 2022-07-11 ENCOUNTER — Other Ambulatory Visit (HOSPITAL_COMMUNITY): Payer: Self-pay

## 2022-07-11 DIAGNOSIS — I10 Essential (primary) hypertension: Secondary | ICD-10-CM | POA: Diagnosis not present

## 2022-07-13 ENCOUNTER — Other Ambulatory Visit (HOSPITAL_COMMUNITY): Payer: Self-pay

## 2022-07-15 ENCOUNTER — Other Ambulatory Visit (HOSPITAL_COMMUNITY): Payer: Self-pay

## 2022-07-15 ENCOUNTER — Telehealth: Payer: Self-pay

## 2022-07-15 NOTE — Telephone Encounter (Signed)
Patients wife called back, I gave them the results for the patients CT Angio. They acknowledged understanding and had no further questions.

## 2022-07-15 NOTE — Progress Notes (Signed)
Left VM for patient to call back

## 2022-07-15 NOTE — Progress Notes (Signed)
Called patient a lady answered an mention that we have the wrong number

## 2022-07-27 ENCOUNTER — Other Ambulatory Visit (HOSPITAL_COMMUNITY): Payer: Self-pay

## 2022-08-11 DIAGNOSIS — E118 Type 2 diabetes mellitus with unspecified complications: Secondary | ICD-10-CM | POA: Diagnosis not present

## 2022-08-11 DIAGNOSIS — I10 Essential (primary) hypertension: Secondary | ICD-10-CM | POA: Diagnosis not present

## 2022-08-11 DIAGNOSIS — E7800 Pure hypercholesterolemia, unspecified: Secondary | ICD-10-CM | POA: Diagnosis not present

## 2022-08-17 ENCOUNTER — Other Ambulatory Visit: Payer: Self-pay

## 2022-08-26 ENCOUNTER — Other Ambulatory Visit: Payer: Self-pay

## 2022-08-27 ENCOUNTER — Other Ambulatory Visit (HOSPITAL_COMMUNITY): Payer: Self-pay

## 2022-08-28 DIAGNOSIS — H35372 Puckering of macula, left eye: Secondary | ICD-10-CM | POA: Diagnosis not present

## 2022-08-28 DIAGNOSIS — H52223 Regular astigmatism, bilateral: Secondary | ICD-10-CM | POA: Diagnosis not present

## 2022-08-28 DIAGNOSIS — H401211 Low-tension glaucoma, right eye, mild stage: Secondary | ICD-10-CM | POA: Diagnosis not present

## 2022-08-28 DIAGNOSIS — H43813 Vitreous degeneration, bilateral: Secondary | ICD-10-CM | POA: Diagnosis not present

## 2022-08-28 DIAGNOSIS — H2513 Age-related nuclear cataract, bilateral: Secondary | ICD-10-CM | POA: Diagnosis not present

## 2022-08-28 DIAGNOSIS — H5203 Hypermetropia, bilateral: Secondary | ICD-10-CM | POA: Diagnosis not present

## 2022-09-09 ENCOUNTER — Other Ambulatory Visit (HOSPITAL_COMMUNITY): Payer: Self-pay

## 2022-09-09 ENCOUNTER — Other Ambulatory Visit: Payer: Self-pay | Admitting: Physician Assistant

## 2022-09-09 DIAGNOSIS — I10 Essential (primary) hypertension: Secondary | ICD-10-CM | POA: Diagnosis not present

## 2022-09-09 DIAGNOSIS — R634 Abnormal weight loss: Secondary | ICD-10-CM

## 2022-09-09 DIAGNOSIS — R1032 Left lower quadrant pain: Secondary | ICD-10-CM

## 2022-09-09 DIAGNOSIS — K649 Unspecified hemorrhoids: Secondary | ICD-10-CM | POA: Diagnosis not present

## 2022-09-09 DIAGNOSIS — R5383 Other fatigue: Secondary | ICD-10-CM | POA: Diagnosis not present

## 2022-09-09 DIAGNOSIS — F0390 Unspecified dementia without behavioral disturbance: Secondary | ICD-10-CM | POA: Diagnosis not present

## 2022-09-09 MED ORDER — HYDROCORTISONE ACETATE 25 MG RE SUPP
RECTAL | 1 refills | Status: DC
  Filled 2022-09-09: qty 14, 14d supply, fill #0

## 2022-09-10 DIAGNOSIS — I10 Essential (primary) hypertension: Secondary | ICD-10-CM | POA: Diagnosis not present

## 2022-09-14 ENCOUNTER — Other Ambulatory Visit (HOSPITAL_COMMUNITY): Payer: Self-pay

## 2022-09-14 DIAGNOSIS — H25043 Posterior subcapsular polar age-related cataract, bilateral: Secondary | ICD-10-CM | POA: Diagnosis not present

## 2022-09-14 DIAGNOSIS — H25013 Cortical age-related cataract, bilateral: Secondary | ICD-10-CM | POA: Diagnosis not present

## 2022-09-14 DIAGNOSIS — H2513 Age-related nuclear cataract, bilateral: Secondary | ICD-10-CM | POA: Diagnosis not present

## 2022-09-14 DIAGNOSIS — H2511 Age-related nuclear cataract, right eye: Secondary | ICD-10-CM | POA: Diagnosis not present

## 2022-09-14 MED ORDER — KETOROLAC TROMETHAMINE 0.5 % OP SOLN
OPHTHALMIC | 1 refills | Status: DC
  Filled 2022-09-14: qty 5, 25d supply, fill #0

## 2022-09-14 MED ORDER — MOXIFLOXACIN HCL 0.5 % OP SOLN
OPHTHALMIC | 1 refills | Status: DC
  Filled 2022-09-14: qty 3, 15d supply, fill #0

## 2022-09-14 MED ORDER — PREDNISOLONE ACETATE 1 % OP SUSP
OPHTHALMIC | 1 refills | Status: DC
  Filled 2022-09-14: qty 5, 25d supply, fill #0

## 2022-09-17 ENCOUNTER — Other Ambulatory Visit (HOSPITAL_COMMUNITY): Payer: Self-pay

## 2022-09-17 MED ORDER — OZEMPIC (2 MG/DOSE) 8 MG/3ML ~~LOC~~ SOPN
PEN_INJECTOR | SUBCUTANEOUS | 3 refills | Status: DC
  Filled 2022-09-17: qty 3, 28d supply, fill #0

## 2022-09-18 ENCOUNTER — Other Ambulatory Visit: Payer: Self-pay | Admitting: Cardiology

## 2022-09-18 DIAGNOSIS — I10 Essential (primary) hypertension: Secondary | ICD-10-CM

## 2022-09-18 DIAGNOSIS — I77819 Aortic ectasia, unspecified site: Secondary | ICD-10-CM

## 2022-09-25 ENCOUNTER — Other Ambulatory Visit (HOSPITAL_COMMUNITY): Payer: Self-pay

## 2022-10-04 ENCOUNTER — Encounter (HOSPITAL_COMMUNITY): Payer: Self-pay

## 2022-10-04 ENCOUNTER — Ambulatory Visit (HOSPITAL_COMMUNITY)
Admission: EM | Admit: 2022-10-04 | Discharge: 2022-10-04 | Disposition: A | Discharge status: Home / Self Care | Payer: Medicare Other | Attending: Internal Medicine | Admitting: Internal Medicine

## 2022-10-04 ENCOUNTER — Ambulatory Visit (INDEPENDENT_AMBULATORY_CARE_PROVIDER_SITE_OTHER): Payer: Medicare Other

## 2022-10-04 DIAGNOSIS — R0902 Hypoxemia: Secondary | ICD-10-CM

## 2022-10-04 DIAGNOSIS — J189 Pneumonia, unspecified organism: Secondary | ICD-10-CM | POA: Diagnosis not present

## 2022-10-04 DIAGNOSIS — R310 Gross hematuria: Secondary | ICD-10-CM | POA: Diagnosis not present

## 2022-10-04 DIAGNOSIS — N3001 Acute cystitis with hematuria: Secondary | ICD-10-CM

## 2022-10-04 DIAGNOSIS — R918 Other nonspecific abnormal finding of lung field: Secondary | ICD-10-CM | POA: Diagnosis not present

## 2022-10-04 DIAGNOSIS — R059 Cough, unspecified: Secondary | ICD-10-CM

## 2022-10-04 DIAGNOSIS — R051 Acute cough: Secondary | ICD-10-CM | POA: Diagnosis not present

## 2022-10-04 LAB — POCT URINALYSIS DIPSTICK, ED / UC
Glucose, UA: NEGATIVE mg/dL
Nitrite: NEGATIVE
Protein, ur: 30 mg/dL — AB
Specific Gravity, Urine: 1.025 (ref 1.005–1.030)
Urobilinogen, UA: 4 mg/dL — ABNORMAL HIGH (ref 0.0–1.0)
pH: 5.5 (ref 5.0–8.0)

## 2022-10-04 MED ORDER — LEVOFLOXACIN 750 MG PO TABS: 750.0000 mg | ORAL_TABLET | Freq: Every day | ORAL | 0 refills | Status: DC

## 2022-10-04 MED ORDER — BENZONATATE 100 MG PO CAPS: 100.0000 mg | ORAL_CAPSULE | Freq: Three times a day (TID) | ORAL | 0 refills | Status: DC

## 2022-10-04 MED ORDER — CEFTRIAXONE SODIUM 1 G IJ SOLR
INTRAMUSCULAR | Status: AC
  Filled 2022-10-04: qty 10

## 2022-10-04 MED ORDER — CEFTRIAXONE SODIUM 1 G IJ SOLR
1.0000 g | Freq: Once | INTRAMUSCULAR | Status: AC
  Administered 2022-10-04: 1 g via INTRAMUSCULAR

## 2022-10-04 MED ORDER — ONDANSETRON 4 MG PO TBDP: 4.0000 mg | ORAL_TABLET | Freq: Three times a day (TID) | ORAL | 0 refills | Status: DC | PRN

## 2022-10-04 MED ORDER — LIDOCAINE HCL (PF) 1 % IJ SOLN
INTRAMUSCULAR | Status: AC
  Filled 2022-10-04: qty 2

## 2022-10-04 NOTE — Discharge Instructions (Addendum)
Your chest x-ray shows a possible early pneumonia.  Your urinalysis shows urinary tract infection.  I will treat both of these infections with the same medication called Levaquin. Give Levaquin 1 pill/day for the next 5 days.  Give this with food to avoid stomach upset. You may give Tylenol as needed for abdominal pain. Continue home medications as prescribed.  I went ahead and gave you a shot of antibiotic to kick start your treatment. I will call you with any of the blood work I got today that comes back abnormal. Continue to drink plenty of water to stay well-hydrated.  You may take Zofran 4 mg under the tongue as needed for nausea and vomiting. You may take Tessalon Perles every 8 hours as needed for cough.  If your fever persists or if your symptoms do not improve in the next 12 to 24 hours, I need you to go to the nearest emergency room for further evaluation.  Follow-up with urology for further discussion and evaluation regarding urinary tract infection.  I would like the urine to be rechecked in the next 1 week to ensure that urine has improved.  I would like for you to follow-up with your primary care doctor in the next 1 to 2 weeks as well for recheck of your heart and lungs to make sure that the cough has improved.  Return to urgent care as needed.

## 2022-10-04 NOTE — ED Triage Notes (Signed)
Patient here today with c/o blood in urine and lower abdominal pain since yesterday morning upon waking. The abdominal pain come and goes. He wears depends at night and this morning he had a lot of blood in the depend.

## 2022-10-04 NOTE — ED Provider Notes (Signed)
[facility]    CSN: [id number] Arrival date & time: 10/04/22  1005      History   Chief Complaint Chief Complaint  Patient presents with   Hematuria    HPI [name] [name] is a 77 y.o. male.   [place] heard to patient with history of memory problems presents urgent care with his wife who contributes to the history for evaluation of gross hematuria, fever/chills, nausea, vomiting, and generalized weakness that started approximately 2 days ago.  Patient wears an adult briefs at night due to history of urinary incontinence and wife states she noticed a copious amount of red urine to patient's brief this morning on waking.  Patient also woke up with left-sided lower abdominal discomfort yesterday morning.  Pain comes and goes and patient is concerned he may have a kidney stone due to history of same.  He reports chills without known documented fever at home over the last couple of days as well.  Denies urinary frequency, urgency, and hesitancy outside of baseline.  No recent antibiotic or steroid use.  Denies frequent intake of known urinary irritants.  He states he has just felt very tired and weak over the last couple of days.  Reports nausea and 1 episode of nonbloody/nonbilious emesis yesterday.  He is not currently nauseous.  Reports recent loose stools, however attributes this to Ozempic use.  He recently stopped using Ozempic due to side effects.  Wife states he has been slightly more confused than normal over the last couple of days. Wife gave tylenol approximately 6 hours ago, he is currently afebrile.   Of note, oxygen saturation is 93% on room air.  Rechecked this and remains between 92 to 93% on room air.  This is decreased from his normal 98% baseline.  He has had a cough for the last approximately 2 to 3 weeks.  Cough is mostly dry.  He is a slightly difficult historian due to memory problems, however he does deny chest pain, heart palpitations, dizziness, and recent falls.   He is a former smoker, quit greater than 50 years ago.  No history of chronic respiratory problems.    Hematuria    Past Medical History:  Diagnosis Date   Aneurysm, ascending aorta (HCC)    Arthritis    Coronary artery disease    pt denies   Diabetes ([place])    type 2   Difficulty urinating    prostate problem   GERD (gastroesophageal reflux disease)    HIATAL HERNIA REPAIRED -with lap band 2 years ago NO LONGER HAS GERD   History of elevated glucose    IN THE PAST - NO PROBLEMS SINCE GASTRIC BANDING WEIGHT LOSS    History of kidney stones    Hyperlipidemia    Hypertension    resolved with lap band 2 years ago   Memory loss    Pneumonia    Pulmonary hypertension ([place])    per echo report  pt unaware   Sleep apnea    uses cpap setting is 12    Patient Active Problem List   Diagnosis Date Noted   Aortic dilatation ([place])    Nonrheumatic mitral valve regurgitation    Aortic root dilation ([place]) 07/25/2018   Aneurysm, ascending aorta ([place]) 07/25/2018   Essential hypertension 07/25/2018   DM2 (diabetes mellitus, type 2) ([place]) 07/25/2018   Spinal stenosis of lumbar region with neurogenic claudication 07/25/2018   OSA on CPAP 07/25/2018   Hemorrhoids 07/26/2013   Left Ureteral  stone 07/18/2011   Pyelonephritis 07/18/2011   Anal fissure 06/17/2011   History of laparoscopic adjustable gastric banding 06/17/2011    Past Surgical History:  Procedure Laterality Date   COLONOSCOPY  07/17/11   CYSTOSCOPY W/ URETERAL STENT PLACEMENT  07/18/2011   Procedure: CYSTOSCOPY WITH STENT REPLACEMENT;  Surgeon: Alexis Frock;  Location: [facility];  Service: Urology;  Laterality: Left;   GASTRIC RESTRICTION SURGERY  12/11/2008   lap band   HEMORRHOID SURGERY N/A 08/04/2013   Procedure: EUA,HEMORRHOIDECTOMY;  Surgeon: [name], MD;  Location: [facility];  Service: General;  Laterality: N/A;   LAPAROSCOPIC GASTRIC BANDING  12/11/2008   LITHOTRIPSY         Home Medications    Prior to  Admission medications   Medication Sig Start Date End Date Taking? Authorizing Provider  acetaminophen (TYLENOL) 500 MG tablet Take 1,000 mg by mouth every 6 (six) hours as needed for moderate pain.   Yes [provider]  alfuzosin (UROXATRAL) 10 MG 24 hr tablet Take 10 mg by mouth daily.   Yes [provider]  amLODipine (NORVASC) 5 MG tablet Take 1 tablet by mouth daily.   Yes [provider]  Ascorbic Acid (VITAMIN C) 500 MG CAPS See admin instructions.   Yes [provider]  aspirin EC 81 MG tablet Take 81 mg by mouth daily.   Yes [provider]  carvedilol (COREG) 3.125 MG tablet TAKE 1 TABLET BY MOUTH TWICE A DAY WITH MEALS 05/15/22  Yes Tolia, Sunit, DO  Cholecalciferol (VITAMIN D3) 125 MCG (5000 UT) TABS Take 5,000 Units by mouth daily.    Yes [provider]  clotrimazole-betamethasone (LOTRISONE) cream Apply 1 application topically 2 (two) times daily. 11/21/20  Yes [provider]  Cyanocobalamin (B-12) 1000 MCG TABS 1 tablet Orally Once a day for 30 day(s) 04/02/22  Yes [provider]  docusate sodium (COLACE) 100 MG capsule Take 200 mg by mouth daily as needed for mild constipation.   Yes [provider]  donepezil (ARICEPT) 10 MG tablet Take 1 tablet by mouth once daily at bedtime. Patient taking differently: 5 mg. 04/23/22  Yes   hydrocortisone (ANUSOL-HC) 25 MG suppository Insert 1 suppository rectally once a day for 14 days. 09/09/22  Yes   irbesartan (AVAPRO) 75 MG tablet TAKE 1 TABLET BY MOUTH EVERY DAY 09/18/22  Yes Tolia, Sunit, DO  ketorolac (ACULAR) 0.5 % ophthalmic solution Instill 1 drop into right eye four times a day as directed 09/14/22  Yes   latanoprost (XALATAN) 0.005 % ophthalmic solution 1 drop at bedtime. 04/24/21  Yes [provider]  memantine (NAMENDA) 10 MG tablet Take 1 tablet (10 mg total) by mouth 2 (two) times daily. Start after finishing 5 mg tablets. 06/23/22  Yes    moxifloxacin (VIGAMOX) 0.5 % ophthalmic solution Instill 1 drop into right eye four times a day as directed 09/14/22  Yes   Multiple Vitamin (MULTIVITAMIN) tablet Take 1 tablet by mouth daily.   Yes [provider]  pantoprazole (PROTONIX) 20 MG tablet Take 20 mg by mouth daily.    Yes [provider]  prednisoLONE acetate (PRED FORTE) 1 % ophthalmic suspension Instill 1 drop into right eye four times a day as directed Please shake well before each use 09/14/22  Yes   rosuvastatin (CRESTOR) 10 MG tablet Take 10 mg by mouth daily. 09/20/19  Yes [provider]  Semaglutide, 2 MG/DOSE, (OZEMPIC, 2 MG/DOSE,) 8 MG/3ML SOPN Inject 2 mg  under the skin once weekly 08/28/21  Yes   Semaglutide, 2 MG/DOSE, (OZEMPIC, 2 MG/DOSE,) 8 MG/3ML SOPN Inject 2 mg into the skin once weekly. 02/09/22  Yes   Semaglutide, 2 MG/DOSE, (OZEMPIC, 2 MG/DOSE,) 8 MG/3ML SOPN Inject 2 mg into the skin once weekly. 09/17/22  Yes   spironolactone (ALDACTONE) 25 MG tablet TAKE 1 TABLET (25 MG TOTAL) BY MOUTH IN THE MORNING Patient taking differently: Take 12.5 mg by mouth once. TAKE 1 TABLET (25 MG TOTAL) BY MOUTH IN THE MORNING. 06/01/22  Yes Tolia, Sunit, DO  TURMERIC-GINGER PO Take 1 tablet by mouth daily.   Yes [provider]  Zinc 50 MG TABS Take 50 mg by mouth daily.    Yes [provider]  benzonatate (TESSALON) 100 MG capsule Take 1 capsule (100 mg total) by mouth every 8 (eight) hours. 10/04/22   Talbot Grumbling, FNP  levofloxacin (LEVAQUIN) 750 MG tablet Take 1 tablet (750 mg total) by mouth daily. 10/04/22   Talbot Grumbling, FNP  ondansetron (ZOFRAN-ODT) 4 MG disintegrating tablet Take 1 tablet (4 mg total) by mouth every 8 (eight) hours as needed for nausea or vomiting. 10/04/22   Talbot Grumbling, FNP    Family History Family History  Problem Relation Age of Onset   Cancer Mother        ovarian   Diabetes Mother    Dementia Mother    Other Father        MVA    Healthy Brother     Social History Social History   Tobacco Use   Smoking status: Former    Packs/day: 2.00    Years: 8.00    Additional pack years: 0.00    Total pack years: 16.00    Types: Cigarettes    Quit date: 04/05/1971    Years since quitting: 51.5   Smokeless tobacco: Former    Types: Snuff  Vaping Use   Vaping Use: Never used  Substance Use Topics   Alcohol use: Yes    Alcohol/week: 0.0 standard drinks of alcohol    Comment: occasional beer   Drug use: Never     Allergies   Patient has no known allergies.   Review of Systems Review of Systems  Genitourinary:  Positive for hematuria.  Per HPI   Physical Exam Triage Vital Signs ED Triage Vitals  Enc Vitals Group     BP 10/04/22 1032 (!) 106/58     Pulse Rate 10/04/22 1032 84     Resp 10/04/22 1032 16     Temp 10/04/22 1032 98.5 F (36.9 C)     Temp Source 10/04/22 1032 Oral     SpO2 10/04/22 1032 93 %     Weight 10/04/22 1033 245 lb (111.1 kg)     Height 10/04/22 1033 6' (1.829 m)     Head Circumference --      Peak Flow --      Pain Score 10/04/22 1036 10     Pain Loc --      Pain Edu? --      Excl. in [place]? --    No data found.  Updated Vital Signs BP (!) 106/58 (BP Location: Right Arm)   Pulse 84   Temp 98.5 F (36.9 C) (Oral)   Resp 16   Ht 6' (1.829 m)   Wt 245 lb (111.1 kg)   SpO2 93%   BMI 33.23 kg/m   Visual Acuity Right Eye Distance:   Left  Eye Distance:   Bilateral Distance:    Right Eye Near:   Left Eye Near:    Bilateral Near:     Physical Exam Vitals and nursing note reviewed.  Constitutional:      Appearance: He is not ill-appearing or toxic-appearing.     Comments: Appears fatigued.  HENT:     Head: Normocephalic and atraumatic.     Right Ear: Hearing, tympanic membrane, ear canal and external ear normal.     Left Ear: Hearing, tympanic membrane, ear canal and external ear normal.     Nose: Nose normal.     Mouth/Throat:     Lips: Pink.     Mouth:  Mucous membranes are moist. No injury.     Tongue: No lesions. Tongue does not deviate from midline.     Palate: No mass and lesions.     Pharynx: Oropharynx is clear. Uvula midline. No pharyngeal swelling, oropharyngeal exudate, posterior oropharyngeal erythema or uvula swelling.     Tonsils: No tonsillar exudate or tonsillar abscesses.  Eyes:     General: Lids are normal. Vision grossly intact. Gaze aligned appropriately.     Extraocular Movements: Extraocular movements intact.     Conjunctiva/sclera: Conjunctivae normal.  Cardiovascular:     Rate and Rhythm: Normal rate and regular rhythm.     Heart sounds: Normal heart sounds, S1 normal and S2 normal.  Pulmonary:     Effort: Pulmonary effort is normal. No respiratory distress.     Breath sounds: Normal air entry. No stridor. Rhonchi present. No wheezing or rales.     Comments: Faint rhonchi heard to the bilateral lower lung fields. No other adventitious sounds heard. Speaking in full sentences without respiratory distress. Chest:     Chest wall: No tenderness.  Abdominal:     General: Abdomen is flat. Bowel sounds are normal.     Palpations: Abdomen is soft.     Tenderness: There is no right CVA tenderness, left CVA tenderness or guarding.  Musculoskeletal:     Cervical back: Neck supple.  Lymphadenopathy:     Cervical: No cervical adenopathy.  Skin:    General: Skin is warm and dry.     Capillary Refill: Capillary refill takes less than 2 seconds.     Findings: No rash.  Neurological:     General: No focal deficit present.     Mental Status: He is alert and oriented to person, place, and time. Mental status is at baseline.     Cranial Nerves: No cranial nerve deficit, dysarthria or facial asymmetry.     Motor: No weakness.     Coordination: Coordination normal.  Psychiatric:        Mood and Affect: Mood normal.        Speech: Speech normal.        Behavior: Behavior normal.        Thought Content: Thought content normal.         Judgment: Judgment normal.      UC Treatments / Results  Labs (all labs ordered are listed, but only abnormal results are displayed) Labs Reviewed  POCT URINALYSIS DIPSTICK, ED / UC - Abnormal; Notable for the following components:      Result Value   Bilirubin Urine MODERATE (*)    Ketones, ur TRACE (*)    Hgb urine dipstick MODERATE (*)    Protein, ur 30 (*)    Urobilinogen, UA 4.0 (*)    Leukocytes,Ua TRACE (*)    All  other components within normal limits  URINE CULTURE    EKG   Radiology DG Chest 2 View  Result Date: 10/04/2022 CLINICAL DATA:  Hypoxemia.  Cough.  Dementia. EXAM: CHEST - 2 VIEW COMPARISON:  Radiographs 07/31/2013.  CT 07/03/2022. FINDINGS: The lateral view was repeated. The heart is enlarged, but stable. The mediastinal contours are stable. Mildly increased patchy opacity at the left lung base on the frontal examination, without definite corresponding abnormality on the lateral view. The right lung appears clear. No pneumothorax or significant pleural effusion identified. There are degenerative changes in the spine without evidence of acute osseous abnormality. IMPRESSION: Mildly increased patchy opacity at the left lung base on the frontal examination, possibly atelectasis or early infiltrate. No definite acute findings. Electronically Signed   By: Richardean Sale M.D.   On: 10/04/2022 12:04    Procedures Procedures (including critical care time)  Medications Ordered in UC Medications  cefTRIAXone (ROCEPHIN) injection 1 g (1 g Intramuscular Given 10/04/22 1131)    Initial Impression / Assessment and Plan / UC Course  I have reviewed the triage vital signs and the nursing notes.  Pertinent labs & imaging results that were available during my care of the patient were reviewed by me and considered in my medical decision making (see chart for details).   1. Gross hematuria, acute cystitis with hematuria Urinalysis concerning for acute infection, urine  culture pending. Low suspicion for pyelonephritis/nephrolithiasis etiology. BP soft, however close to baseline. Heart rate normal, afebrile, and overall nontoxic in appearance. No indication for workup in the ED based on presentation in clinic today. Previous labs reviewed from 06/2022. Normal renal function. Levaquin 750mg  QD for 5 days sent to treat acute complicated cystitis. Advised to push fluids to stay well hydrated. Zofran may be used every 8 hours as needed for nausea. Encouraged follow-up with urology soon as well as PCP for re-check of urine in 1-2 weeks to ensure improvement after antibiotics. Return to UC or ER if fail to improve in the next 12-24 hours with antibiotic.   2. Community acquired pneumonia, acute cough Chest x-ray performed due to lower oxygen saturation from baseline and rhonchi heard to auscultation.  Chest x-ray shows possible early infiltrate development to the left lower lung base.  Levaquin chose to treat both acute complicated cystitis and likely early developing community-acquired pneumonia.  No new orthopnea or leg swelling.  He does not appear to be hypervolemic.  No respiratory distress or increased respiratory effort appreciated to exam.  Wife at bedside states his breathing is currently at baseline.  Advised to bring him to the nearest emergency department if he gets any worse or develops any new symptoms for further workup.  Basic blood work drawn today to assess for kidney function and blood levels.  Will call if any of the blood work is abnormal. CURB 65 score is 2 due to confusion (baseline) and age. Outpatient treatment appropriate.  Discussed physical exam and available lab work findings in clinic with patient.  Counseled patient regarding appropriate use of medications and potential side effects for all medications recommended or prescribed today. Discussed red flag signs and symptoms of worsening condition,when to call the PCP office, return to urgent care, and  when to seek higher level of care in the emergency department. Patient verbalizes understanding and agreement with plan. All questions answered. Patient discharged in stable condition.   Final Clinical Impressions(s) / UC Diagnoses   Final diagnoses:  Gross hematuria  Acute cough  Community  acquired pneumonia of left lower lobe of lung  Acute cystitis with hematuria     Discharge Instructions      Your chest x-ray shows a possible early pneumonia.  Your urinalysis shows urinary tract infection.  I will treat both of these infections with the same medication called Levaquin. Give Levaquin 1 pill/day for the next 5 days.  Give this with food to avoid stomach upset. You may give Tylenol as needed for abdominal pain. Continue home medications as prescribed.  I went ahead and gave you a shot of antibiotic to kick start your treatment. I will call you with any of the blood work I got today that comes back abnormal. Continue to drink plenty of water to stay well-hydrated.  You may take Zofran 4 mg under the tongue as needed for nausea and vomiting. You may take Tessalon Perles every 8 hours as needed for cough.  If your fever persists or if your symptoms do not improve in the next 12 to 24 hours, I need you to go to the nearest emergency room for further evaluation.  Follow-up with urology for further discussion and evaluation regarding urinary tract infection.  I would like the urine to be rechecked in the next 1 week to ensure that urine has improved.  I would like for you to follow-up with your primary care doctor in the next 1 to 2 weeks as well for recheck of your heart and lungs to make sure that the cough has improved.  Return to urgent care as needed.       ED Prescriptions     Medication Sig Dispense Auth. Provider   levofloxacin (LEVAQUIN) 750 MG tablet  (Status: Discontinued) Take 1 tablet (750 mg total) by mouth daily. 5 tablet Joella Prince M, FNP    benzonatate (TESSALON) 100 MG capsule  (Status: Discontinued) Take 1 capsule (100 mg total) by mouth every 8 (eight) hours. 21 capsule Joella Prince M, FNP   ondansetron (ZOFRAN-ODT) 4 MG disintegrating tablet  (Status: Discontinued) Take 1 tablet (4 mg total) by mouth every 8 (eight) hours as needed for nausea or vomiting. 20 tablet Joella Prince M, FNP   benzonatate (TESSALON) 100 MG capsule Take 1 capsule (100 mg total) by mouth every 8 (eight) hours. 21 capsule Joella Prince M, FNP   levofloxacin (LEVAQUIN) 750 MG tablet Take 1 tablet (750 mg total) by mouth daily. 5 tablet Joella Prince M, FNP   ondansetron (ZOFRAN-ODT) 4 MG disintegrating tablet Take 1 tablet (4 mg total) by mouth every 8 (eight) hours as needed for nausea or vomiting. 20 tablet Talbot Grumbling, FNP      PDMP not reviewed this encounter.   Talbot Grumbling, [place] 10/04/22 2036

## 2022-10-06 LAB — URINE CULTURE: Culture: >=100000 — AB

## 2022-10-07 ENCOUNTER — Ambulatory Visit
Admission: RE | Admit: 2022-10-07 | Discharge: 2022-10-07 | Disposition: A | Discharge status: Home / Self Care | Payer: Medicare Other | Source: Ambulatory Visit | Attending: Physician Assistant | Admitting: Physician Assistant

## 2022-10-07 DIAGNOSIS — K449 Diaphragmatic hernia without obstruction or gangrene: Secondary | ICD-10-CM | POA: Diagnosis not present

## 2022-10-07 DIAGNOSIS — K8020 Calculus of gallbladder without cholecystitis without obstruction: Secondary | ICD-10-CM | POA: Diagnosis not present

## 2022-10-07 DIAGNOSIS — R634 Abnormal weight loss: Secondary | ICD-10-CM

## 2022-10-07 DIAGNOSIS — Z122 Encounter for screening for malignant neoplasm of respiratory organs: Secondary | ICD-10-CM | POA: Diagnosis not present

## 2022-10-07 DIAGNOSIS — N400 Enlarged prostate without lower urinary tract symptoms: Secondary | ICD-10-CM | POA: Diagnosis not present

## 2022-10-07 DIAGNOSIS — R1032 Left lower quadrant pain: Secondary | ICD-10-CM

## 2022-10-07 MED ORDER — IOPAMIDOL (ISOVUE-300) INJECTION 61%
100.0000 mL | Freq: Once | INTRAVENOUS | Status: AC | PRN
  Administered 2022-10-07: 100 mL via INTRAVENOUS

## 2022-10-10 DIAGNOSIS — I10 Essential (primary) hypertension: Secondary | ICD-10-CM | POA: Diagnosis not present

## 2022-10-20 ENCOUNTER — Other Ambulatory Visit (HOSPITAL_COMMUNITY): Payer: Self-pay

## 2022-10-26 DIAGNOSIS — R109 Unspecified abdominal pain: Secondary | ICD-10-CM | POA: Diagnosis not present

## 2022-10-26 DIAGNOSIS — K5900 Constipation, unspecified: Secondary | ICD-10-CM | POA: Diagnosis not present

## 2022-10-26 DIAGNOSIS — K649 Unspecified hemorrhoids: Secondary | ICD-10-CM | POA: Diagnosis not present

## 2022-10-26 DIAGNOSIS — Z86010 Personal history of colonic polyps: Secondary | ICD-10-CM | POA: Diagnosis not present

## 2022-10-28 ENCOUNTER — Other Ambulatory Visit: Payer: Self-pay | Admitting: Gastroenterology

## 2022-10-28 ENCOUNTER — Ambulatory Visit
Admission: RE | Admit: 2022-10-28 | Discharge: 2022-10-28 | Disposition: A | Discharge status: Home / Self Care | Payer: Medicare Other | Source: Ambulatory Visit | Attending: Gastroenterology | Admitting: Gastroenterology

## 2022-10-28 ENCOUNTER — Other Ambulatory Visit (HOSPITAL_COMMUNITY): Payer: Self-pay

## 2022-10-28 DIAGNOSIS — K5900 Constipation, unspecified: Secondary | ICD-10-CM

## 2022-10-28 DIAGNOSIS — R109 Unspecified abdominal pain: Secondary | ICD-10-CM | POA: Diagnosis not present

## 2022-11-09 DIAGNOSIS — I10 Essential (primary) hypertension: Secondary | ICD-10-CM | POA: Diagnosis not present

## 2022-11-19 ENCOUNTER — Other Ambulatory Visit: Payer: Self-pay | Admitting: Cardiology

## 2022-11-19 DIAGNOSIS — I77810 Thoracic aortic ectasia: Secondary | ICD-10-CM

## 2022-11-19 DIAGNOSIS — I10 Essential (primary) hypertension: Secondary | ICD-10-CM

## 2022-11-24 ENCOUNTER — Other Ambulatory Visit (HOSPITAL_COMMUNITY): Payer: Self-pay

## 2022-12-03 DIAGNOSIS — H2511 Age-related nuclear cataract, right eye: Secondary | ICD-10-CM | POA: Diagnosis not present

## 2022-12-03 DIAGNOSIS — H25011 Cortical age-related cataract, right eye: Secondary | ICD-10-CM | POA: Diagnosis not present

## 2022-12-04 ENCOUNTER — Other Ambulatory Visit (HOSPITAL_COMMUNITY): Payer: Self-pay

## 2022-12-04 DIAGNOSIS — H2512 Age-related nuclear cataract, left eye: Secondary | ICD-10-CM | POA: Diagnosis not present

## 2022-12-04 DIAGNOSIS — H25042 Posterior subcapsular polar age-related cataract, left eye: Secondary | ICD-10-CM | POA: Diagnosis not present

## 2022-12-04 DIAGNOSIS — H25012 Cortical age-related cataract, left eye: Secondary | ICD-10-CM | POA: Diagnosis not present

## 2022-12-04 MED ORDER — MOXIFLOXACIN HCL 0.5 % OP SOLN
OPHTHALMIC | 1 refills | Status: DC
  Filled 2022-12-04: qty 3, 23d supply, fill #0
  Filled 2023-01-04: qty 3, 23d supply, fill #1

## 2022-12-04 MED ORDER — KETOROLAC TROMETHAMINE 0.5 % OP SOLN
OPHTHALMIC | 1 refills | Status: DC
  Filled 2022-12-04: qty 5, 23d supply, fill #0
  Filled 2023-01-10: qty 5, 23d supply, fill #1

## 2022-12-04 MED ORDER — PREDNISOLONE ACETATE 1 % OP SUSP
OPHTHALMIC | 1 refills | Status: DC
  Filled 2022-12-04: qty 5, 23d supply, fill #0
  Filled 2023-01-26: qty 5, 23d supply, fill #1

## 2022-12-09 DIAGNOSIS — I10 Essential (primary) hypertension: Secondary | ICD-10-CM | POA: Diagnosis not present

## 2022-12-22 ENCOUNTER — Encounter: Payer: Self-pay | Admitting: Cardiology

## 2022-12-22 ENCOUNTER — Ambulatory Visit: Payer: Medicare Other | Admitting: Cardiology

## 2022-12-22 VITALS — BP 123/66 | HR 54 | Ht 72.0 in | Wt 259.0 lb

## 2022-12-22 DIAGNOSIS — I700 Atherosclerosis of aorta: Secondary | ICD-10-CM

## 2022-12-22 DIAGNOSIS — I77810 Thoracic aortic ectasia: Secondary | ICD-10-CM | POA: Diagnosis not present

## 2022-12-22 DIAGNOSIS — Z87891 Personal history of nicotine dependence: Secondary | ICD-10-CM

## 2022-12-22 DIAGNOSIS — I2510 Atherosclerotic heart disease of native coronary artery without angina pectoris: Secondary | ICD-10-CM

## 2022-12-22 DIAGNOSIS — G4733 Obstructive sleep apnea (adult) (pediatric): Secondary | ICD-10-CM | POA: Diagnosis not present

## 2022-12-22 DIAGNOSIS — I10 Essential (primary) hypertension: Secondary | ICD-10-CM | POA: Diagnosis not present

## 2022-12-22 DIAGNOSIS — Z6835 Body mass index (BMI) 35.0-35.9, adult: Secondary | ICD-10-CM | POA: Diagnosis not present

## 2022-12-22 DIAGNOSIS — I2584 Coronary atherosclerosis due to calcified coronary lesion: Secondary | ICD-10-CM | POA: Diagnosis not present

## 2022-12-22 NOTE — Progress Notes (Signed)
Primary Physician:  Merri Brunette, MD  Patient ID: [name] [name], male    DOB: [birth year], 77 y.o.   MRN: [id number]   Date: 12/22/22 Last Office Visit: 06/22/2022  Subjective:    Chief Complaint  Patient presents with   Ascending aorta dilation   Follow-up    HPI: [name] [name]  is a 77 y.o. male  whose past medical history is hypertension, lumbar spinal stenosis, obstructive sleep apnea on CPAP, mild ascending aortic aneurysm 4.4 cm (December 2023), advanced age, coronary artery calcification, former smoker, and obesity due to excess calories s/p gastric banding surgery in 2010.   He  is accompanied by his wife at today's office visit.   Over the last 6 months he denies anginal chest pain or heart failure symptoms.  He continues to do his regular activities of daily living and goes out for a walk couple times a day for 10 to 15 minutes.  He is able to manage his activities of daily living despite his memory but also requires assistance from wife.  He had a CT of the chest in December 2023 which notes the ascending aortic aneurysm to be stable at 4.4 cm.  His blood pressures at home are well-controlled.  His pulse has been less than 55 bpm.  Review of his medications also noted Aricept which may be contributory.  His wife states that he has been on it for at least a year provided by his PCP.   Past Medical History:  Diagnosis Date   Aneurysm, ascending aorta (HCC)    Arthritis    Coronary artery disease    pt denies   Diabetes (HCC)    type 2   Difficulty urinating    prostate problem   GERD (gastroesophageal reflux disease)    HIATAL HERNIA REPAIRED -with lap band 2 years ago NO LONGER HAS GERD   History of elevated glucose    IN THE PAST - NO PROBLEMS SINCE GASTRIC BANDING WEIGHT LOSS    History of kidney stones    Hyperlipidemia    Hypertension    resolved with lap band 2 years ago   Memory loss    Pneumonia    Pulmonary hypertension (HCC)    per echo report  pt  [place]   Sleep apnea    uses cpap setting is 12    Past Surgical History:  Procedure Laterality Date   COLONOSCOPY  07/17/11   CYSTOSCOPY W/ URETERAL STENT PLACEMENT  07/18/2011   Procedure: CYSTOSCOPY WITH STENT REPLACEMENT;  Surgeon: Sebastian Ache;  Location: [facility];  Service: Urology;  Laterality: Left;   GASTRIC RESTRICTION SURGERY  12/11/2008   lap band   HEMORRHOID SURGERY N/A 08/04/2013   Procedure: EUA,HEMORRHOIDECTOMY;  Surgeon: Valarie Merino, MD;  Location: [facility];  Service: General;  Laterality: N/A;   LAPAROSCOPIC GASTRIC BANDING  12/11/2008   LITHOTRIPSY      Social History   Tobacco Use   Smoking status: Former    Packs/day: 2.00    Years: 8.00    Additional pack years: 0.00    Total pack years: 16.00    Types: Cigarettes    Quit date: 04/05/1971    Years since quitting: 51.7   Smokeless tobacco: Former    Types: Snuff  Vaping Use   Vaping Use: Never used  Substance Use Topics   Alcohol use: Yes    Alcohol/week: 0.0 standard drinks of alcohol    Comment: occasional beer   Drug use:  Never   Review of Systems  Constitutional: Positive for malaise/fatigue and weight loss.  Cardiovascular:  Negative for chest pain, dyspnea on exertion, leg swelling, near-syncope, orthopnea, palpitations, paroxysmal nocturnal dyspnea and syncope.  Respiratory:  Negative for shortness of breath.       Objective:  Blood pressure 123/66, pulse (!) 54, height 6' (1.829 m), weight 259 lb (117.5 kg), SpO2 96 %. Body mass index is 35.13 kg/m.    Physical Exam Vitals reviewed.  Constitutional:      General: He is not in acute distress.    Appearance: He is well-developed.  HENT:     Head: Normocephalic and atraumatic.  Cardiovascular:     Rate and Rhythm: Regular rhythm. Bradycardia present.     Pulses: Intact distal pulses.          Femoral pulses are 2+ on the right side and 2+ on the left side.      Popliteal pulses are 2+ on the right side and 2+ on the left side.        Dorsalis pedis pulses are 1+ on the right side and 1+ on the left side.       Posterior tibial pulses are 2+ on the right side and 2+ on the left side.     Heart sounds: Normal heart sounds.  Pulmonary:     Effort: Pulmonary effort is normal. No accessory muscle usage or respiratory distress.     Breath sounds: Normal breath sounds.  Abdominal:     General: Bowel sounds are normal.     Palpations: Abdomen is soft.  Musculoskeletal:        General: Normal range of motion.     Cervical back: Normal range of motion.  Skin:    General: Skin is warm and dry.  Neurological:     Mental Status: He is alert and oriented to person, place, and time.  Psychiatric:        Behavior: Behavior normal.    Radiology: CTA chest with contrast 01/29/2020 Stable mild aneurysmal dilatation of the ascending thoracic aorta, maximal diameter 4.4 cm. Aortic Atherosclerosis (ICD10-I70.0). See report for additional details.  CTA chest aorta with and without contrast  05/30/2021: Unchanged fusiform aneurysmal changes of the ascending thoracic aorta measuring up to 4.4 cm. Recommend annual imaging followup by CTA or MRA.  Coronary and aortic atherosclerosis (ICD10-I70.0). See report for additional details.  CT ANGIO CHEST AORTA W/ & OR WO/CM & GATING 07/03/2022: Unchanged fusiform aneurysmal changes of the ascending thoracic aorta measuring up to 4.4 cm. Recommend annual imaging followup by CTA or MRA. This recommendation follows 2010 Aortic aneurysm NOS (ICD10-I71.9) Coronary and aortic atherosclerosis (ICD10-I70.0). See report for additional details  Laboratory examination:   External Labs: Collected: 08/13/2020 provided by PCP. Creatinine 1 mg/dL. eGFR: 77 mL/min per 1.73 m Sodium 141, potassium 5.9, chloride 103, bicarb 26 AST 18, ALT 23, Lipid profile: Total cholesterol 103, triglycerides 94, HDL 34, non-HDL 51 Hemoglobin A1c: 6  Comprehensive Metabolic Panel RS In-house Reviewed date:03/10/2022  11:02:07 AM Interpretation: Performing Lab: Notes/Report: Test performed by [facility] [address], [address], [place], [place] [address] Rudi Rummage, MD, Laboratory Director CLIA: [phone]  Sodium 139 136-145 mEq/L    Potassium 4.6 3.5-5.1 mEq/L    Chloride 103 98-107 mEq/L    CO2 27 21-32 mmol/L    Glucose 112 74-106 mg/dL    BUN 12 6-96 mg/dL    Creatinine 2.95 2.84-1.32 mg/dL    Calcium 9.0 4.4-01.0  mg/dL    Protein 7.1 0.8-6.5 g/dL    Albumin 3.9 7.8-4.6 g/dL    Alkaline Phosphatase 67 25-150 IU/L    ALT (SGPT) 20 <6-78 IU/L    AST (SGOT) 11 0-40 IU/L    Bilirubin, Total 0.4 0.2-1.0 mg/dL    Globulin, Calculated 3.2 1.5-4.6 g/dL    Albumin/Globulin Ratio 1.2 1.1-2.5 mg/dL    BUN/Creatinine Ratio 13.2 11.0-26.0 Ratio    GFR/Black 93 >59 mL/min/1.39m2    GFR/White 81 >59 mL/min/1.43m2 GFR Categories in Chronic Kidney Disease (CKD)  GFR Category GFR (mL/min/1.73 sq. meters) Interpretation  G1 90 or greater Normal or high*  G2 60-89 Mild decrease*  G3a 45-59 Mild to moderate decrease  G3b 30-44 Moderate to severe decrease  G4 15-29 Severe decrease  G5 14 or less Kidney failure  *In the absence of kidney damage, neither GFR category G1 or G2 fulfill  the criteria for CKD (Kidney Int Suppl 2013; 3.1-150)  The CKD-EPI calculation is intended for use in patients 12 years of age and  older. Decreased calculation accuracy may be seen in patients taking  medications that affect renal excretion, or in those patients with extremes in  muscle mass or diet.   CBC With Platelet And Differential RS In-house Reviewed date:03/10/2022 11:02:07 AM Interpretation: Performing Lab: Notes/Report: Test performed by [facility] [address], [address], [place], [place] [address] Rudi Rummage, MD, Laboratory Director CLIA: [phone]  WBC 5.7 4.5-11.0 K/uL    RBC 3.87 4.20-5.40 M/mm3    Hemoglobin 12.7 13.5-18.0 gm/dL     Hematocrit 02.7 25.3-66.4 %    MCV 97.9 80.0-100.0 fL    MCH 32.8 26.0-33.0 pg    MCHC 33.5 32.0-36.0 g/dL    RDW 40.3      Platelet Count 223 140-400 K/cumm    Neutrophils Automated 66.9 42.2-75.2 %    Lymphocytes Automated 19.3 20.5-51.1 %    Monocytes Automated 9.8 5.0-12.0 %    Eosinophils Automated 3.5 0.0-7.0 %    Basophils Automated 0.5 0.0-2.0 %    Absolute Neutrophils 3.8 2.0-8.2 K/uL    Absolute Lymphocytes 1.1 0.9-3.6 K/uL    Absolute Monocytes 0.6 0.3-1.0 K/uL    Absolute Eosinophils 0.2 0.0-0.6 K/uL    Absolute Basophils 0.0 0.0-0.1 K/uL    Lipid Panel Reviewed date:03/12/2022 05:23:57 PM Interpretation: Performing Lab: Notes/Report: FASTING Test performed by [facility], [facility] [address], [place], [place] [address] Lowell Bouton, MD, Laboratory Director CLIA: [id number]  Cholesterol 93 <200 mg/dL    Triglycerides 96 <329 mg/dL    HDL Cholesterol 34 >51 mg/dL    Cholesterol / HDL Ratio 2.74 0.00-4.99 Ratio    Non-HDL Cholesterol 59 <130 mg/dL    LDL Cholesterol (Calculation) 40 <130 mg/dL LDL Cholesterol Levels*  Less than 100 mg/dL Optimal  884 to 166 mg/dL Near Optimal/ Above Optimal  130 to 159 mg/dL Borderline High  063 to 189 mg/dL High  016 mg/dL and above Very High  * Categories as recommended by the 2004 ATPIII guidelines   LDL/HDL Ratio 1.2 <3.3 Ratio _________________________________________________________________________  LDL Cholesterol Patient History  _________________________________________________________________________  Test Date: 08/25/2021  LDL Results: 35  Units: mg/dL  % Change: -  -------------------------------------------------------------------------  Test Date: 03/06/2022  LDL Results: 40  Units: mg/dL  % Change: +01%  _________________________________________________________________________   Hemoglobin A1C Reviewed date:03/10/2022 11:02:06 AM Interpretation: Performing Lab: Notes/Report: Test  performed by [facility], [facility] [address], [place], [place] [address] Benton R.  Middleman, MD, Laboratory Director CLIA: [id number]  Hemoglobin A1C 5.7 <5.7 % The following HbA1c ranges recommended by the American Diabetes Association  (ADA) may be used as an aid in the diagnosis of diabetes mellitus.  HA1c Suggested Diagnosis  >=6.5% Diabetic  5.7% - 6.4% Pre-Diabetic  <5.7% Non-Diabetic   Estimated Average Glucose 117   Average Glucose is calculated using the equation AG = (28.7 x HgbA1c) - 46.7  based on the guidelines established by the ADA.   Glycated Hgb-Fingerstick Reviewed date:08/11/2022 08:45:54 AM Interpretation:5.8 Performing Lab: Notes/Report: 5.8     Current Meds  Medication Sig   acetaminophen (TYLENOL) 500 MG tablet Take 1,000 mg by mouth every 6 (six) hours as needed for moderate pain.   alfuzosin (UROXATRAL) 10 MG 24 hr tablet Take 10 mg by mouth daily.   amLODipine (NORVASC) 5 MG tablet Take 1 tablet by mouth daily.   Ascorbic Acid (VITAMIN C) 500 MG CAPS See admin instructions.   aspirin EC 81 MG tablet Take 81 mg by mouth daily.   Cholecalciferol (VITAMIN D3) 125 MCG (5000 UT) TABS Take 5,000 Units by mouth daily.    clotrimazole-betamethasone (LOTRISONE) cream Apply 1 application topically 2 (two) times daily.   Cyanocobalamin (B-12) 1000 MCG TABS 3,000 mcg.   docusate sodium (COLACE) 100 MG capsule Take 200 mg by mouth daily as needed for mild constipation.   donepezil (ARICEPT) 10 MG tablet Take 1 tablet by mouth once daily at bedtime. (Patient taking differently: 5 mg.)   hydrocortisone (ANUSOL-HC) 25 MG suppository Insert 1 suppository rectally once a day for 14 days.   irbesartan (AVAPRO) 75 MG tablet TAKE 1 TABLET BY MOUTH EVERY DAY   ketorolac (ACULAR) 0.5 % ophthalmic solution Instill 1 drop into right eye four times a day as directed   ketorolac (ACULAR) 0.5 % ophthalmic solution Place 1 drop into left eye four times a day as  directed   latanoprost (XALATAN) 0.005 % ophthalmic solution 1 drop at bedtime.   levofloxacin (LEVAQUIN) 750 MG tablet Take 1 tablet (750 mg total) by mouth daily.   memantine (NAMENDA) 10 MG tablet Take 1 tablet (10 mg total) by mouth 2 (two) times daily. Start after finishing 5 mg tablets.   moxifloxacin (VIGAMOX) 0.5 % ophthalmic solution Instill 1 drop into right eye four times a day as directed   moxifloxacin (VIGAMOX) 0.5 % ophthalmic solution Place 1 drop into left eye four times a day as directed   Multiple Vitamin (MULTIVITAMIN) tablet Take 1 tablet by mouth daily.   ondansetron (ZOFRAN-ODT) 4 MG disintegrating tablet Take 1 tablet (4 mg total) by mouth every 8 (eight) hours as needed for nausea or vomiting.   pantoprazole (PROTONIX) 20 MG tablet Take 20 mg by mouth daily.    prednisoLONE acetate (PRED FORTE) 1 % ophthalmic suspension Instill 1 drop into right eye four times a day as directed Please shake well before each use   prednisoLONE acetate (PRED FORTE) 1 % ophthalmic suspension Place 1 drop into left eye four times a day as directed. Please shake well before each use!   rosuvastatin (CRESTOR) 10 MG tablet Take 10 mg by mouth daily.   Semaglutide, 2 MG/DOSE, (OZEMPIC, 2 MG/DOSE,) 8 MG/3ML SOPN Inject 2 mg under the skin once weekly   Semaglutide, 2 MG/DOSE, (OZEMPIC, 2 MG/DOSE,) 8 MG/3ML SOPN Inject 2 mg into the skin once weekly.   Semaglutide, 2 MG/DOSE, (OZEMPIC, 2 MG/DOSE,) 8 MG/3ML SOPN Inject 2 mg into the skin once weekly.  spironolactone (ALDACTONE) 25 MG tablet TAKE 1 TABLET (25 MG TOTAL) BY MOUTH IN THE MORNING (Patient taking differently: Take 12.5 mg by mouth once. TAKE 1 TABLET (25 MG TOTAL) BY MOUTH IN THE MORNING.)   TURMERIC-GINGER PO Take 1 tablet by mouth daily.   Zinc 50 MG TABS Take 50 mg by mouth daily.    [DISCONTINUED] carvedilol (COREG) 3.125 MG tablet TAKE 1 TABLET BY MOUTH TWICE A DAY WITH FOOD    Cardiac Studies:  EKG: December 22, 2022: Sinus  bradycardia, 49 bpm, without underlying ischemia or injury pattern.  Compared to prior tracing 06/22/2022 ventricular rate is lower.  Echocardiogram 12/18/2021:  Left ventricle cavity is normal in size and wall thickness. Normal global wall motion. Normal LV systolic function with EF 53%. Doppler evidence of grade I (impaired) diastolic dysfunction, normal LAP.  The aortic root is dilated, measuring 4 cm at Sinus of Valsalva.  Proximal ascending aorta is dilated at 4.3 cm.  Left atrial cavity is severely dilated.  Right ventricle cavity is mildly dilated. Normal right ventricular function.  Mild (Grade I) mitral regurgitation.  Mild tricuspid regurgitation. Estimated pulmonary artery systolic pressure 32 mmHg.  Mild pulmonic regurgitation.  Personally reviewed and compared study from 2020. Aortic measurements are stable and unchanged. Estimated RVSP is lowed (50 mmHg then, 32 mmHg now), but mild RV dilatation is new.   Exercise myoview stress test  05/01/2019: Patient exercised on Bruce protocol for 5:00  minutes and achieved  99 % of Max Predicted HR (Target HR was >85% MPHR) and  6.79 METS. Stress symptoms included fatigue and dyspnea. Normal BP response.  Exercise capacity was low. The perfusion imaging study demonstrates mild diaphragmatic attenuation artifact and normal perfusion in all vascular territories. Dynamic gated images reveal normal endocardial thickening and left ventricular systolic function. LVEF calculated was   45% but visually appeared to be normal. This is a low risk study.  US Carotid Bilateral Cone 05/01/2015: Normal. Negative for stenosis.    ABI 09/29/2019: This exam reveals normal perfusion of the right lower extremity (ABI 1.19 ) and normal perfusion of the left lower extremity (ABI 1.26).    Sleep Study: Positive for Sleep Apnea; Uses CPAP  Assessment:     ICD-10-CM   1. Ascending aorta dilation (HCC)  I77.810 EKG 12-Lead    CT ANGIO CHEST AORTA W/ & OR WO/CM &  GATING ([place] ONLY)    Basic metabolic panel    2. Essential hypertension  I10 CT ANGIO CHEST AORTA W/ & OR WO/CM & GATING ([place] ONLY)    Basic metabolic panel    3. Coronary atherosclerosis due to calcified coronary lesion of native artery  I25.10    I25.84     4. Atherosclerosis of aorta (HCC)  I70.0     5. OSA on CPAP  G47.33     6. Former smoker  Z87.891     7. Class 2 severe obesity due to excess calories with serious comorbidity and body mass index (BMI) of 35.0 to 35.9 in adult [facility])  E66.01    Z68.35      Recommendations:   Ascending aorta dilation (HCC) Chronic. CTA chest July 05, 2022:: 44 mm ascending thoracic aorta. Echo June 2023: Sinus of Valsalva 40 mm and proximal ascending aorta 43 mm. Office and home blood pressures are well-controlled. Will continue with annual follow-up imaging. Plan BMP in November and follow-up CT scan in December 2024.  Essential hypertension Home blood pressures are well-controlled. Will  discontinue carvedilol given his bradycardia. Family is asked to keep a closer eye on his blood pressures at home.  Coronary atherosclerosis due to calcified coronary lesion of native artery / Atherosclerosis of aorta  Denies anginal chest pain, heart failure symptoms. Prior echo and stress test results reviewed LDL currently at goal. Reemphasized the importance of secondary prevention and improving his modifiable cardiovascular risk factors.  OSA on CPAP Endorses compliance with his CPAP regularly.  Class 2 severe obesity due to excess calories with serious comorbidity and body mass index (BMI) of 35.0 to 35.9 in adult [facility]) Body mass index is 35.13 kg/m. I reviewed with the patient the importance of diet, regular physical activity/exercise, weight loss.   Patient is educated on increasing physical activity gradually as tolerated.  With the goal of moderate intensity exercise for 30 minutes a day 5 days a week.   Orders Placed  This Encounter  Procedures   CT ANGIO CHEST AORTA W/ & OR WO/CM & GATING ([place] ONLY)   Basic metabolic panel   EKG 12-Lead   --Continue cardiac medications as reconciled in final medication list. --Return in about 6 months (around 06/23/2023) for Follow up Ascending Ao dilatation, annual CT. . Or sooner if needed. --Continue follow-up with your primary care physician regarding the management of your other chronic comorbid conditions.  Patient's questions and concerns were addressed to his satisfaction. He voices understanding of the instructions provided during this encounter.   This note was created using a voice recognition software as a result there may be grammatical errors inadvertently enclosed that do not reflect the nature of this encounter. Every attempt is made to correct such errors.  Tessa Lerner, [place], [facility]  Pager:  [phone] Office: [phone]

## 2022-12-29 ENCOUNTER — Other Ambulatory Visit: Payer: Self-pay

## 2022-12-29 DIAGNOSIS — B372 Candidiasis of skin and nail: Secondary | ICD-10-CM | POA: Diagnosis not present

## 2022-12-29 DIAGNOSIS — I10 Essential (primary) hypertension: Secondary | ICD-10-CM

## 2022-12-29 DIAGNOSIS — I77810 Thoracic aortic ectasia: Secondary | ICD-10-CM

## 2023-01-04 DIAGNOSIS — H25012 Cortical age-related cataract, left eye: Secondary | ICD-10-CM | POA: Diagnosis not present

## 2023-01-04 DIAGNOSIS — H2512 Age-related nuclear cataract, left eye: Secondary | ICD-10-CM | POA: Diagnosis not present

## 2023-01-26 ENCOUNTER — Other Ambulatory Visit (HOSPITAL_COMMUNITY): Payer: Self-pay

## 2023-01-26 ENCOUNTER — Other Ambulatory Visit: Payer: Self-pay

## 2023-02-02 DIAGNOSIS — R972 Elevated prostate specific antigen [PSA]: Secondary | ICD-10-CM | POA: Diagnosis not present

## 2023-02-09 ENCOUNTER — Other Ambulatory Visit (HOSPITAL_COMMUNITY): Payer: Self-pay

## 2023-02-09 DIAGNOSIS — R972 Elevated prostate specific antigen [PSA]: Secondary | ICD-10-CM | POA: Diagnosis not present

## 2023-02-09 DIAGNOSIS — N5201 Erectile dysfunction due to arterial insufficiency: Secondary | ICD-10-CM | POA: Diagnosis not present

## 2023-02-09 MED ORDER — ALFUZOSIN HCL ER 10 MG PO TB24
10.0000 mg | ORAL_TABLET | Freq: Every day | ORAL | 3 refills | Status: AC
  Filled 2023-02-09 – 2023-02-15 (×3): qty 90, 90d supply, fill #0
  Filled 2023-05-24: qty 90, 90d supply, fill #1
  Filled 2023-08-23: qty 90, 90d supply, fill #2
  Filled 2023-11-22: qty 90, 90d supply, fill #3

## 2023-02-12 ENCOUNTER — Other Ambulatory Visit (HOSPITAL_COMMUNITY): Payer: Self-pay

## 2023-02-15 ENCOUNTER — Other Ambulatory Visit (HOSPITAL_COMMUNITY): Payer: Self-pay

## 2023-02-17 ENCOUNTER — Other Ambulatory Visit (HOSPITAL_COMMUNITY): Payer: Self-pay

## 2023-02-19 DIAGNOSIS — H01019 Ulcerative blepharitis unspecified eye, unspecified eyelid: Secondary | ICD-10-CM | POA: Diagnosis not present

## 2023-03-02 ENCOUNTER — Other Ambulatory Visit (HOSPITAL_COMMUNITY): Payer: Self-pay

## 2023-03-02 DIAGNOSIS — E1169 Type 2 diabetes mellitus with other specified complication: Secondary | ICD-10-CM | POA: Diagnosis not present

## 2023-03-02 DIAGNOSIS — I10 Essential (primary) hypertension: Secondary | ICD-10-CM | POA: Diagnosis not present

## 2023-03-02 DIAGNOSIS — E7800 Pure hypercholesterolemia, unspecified: Secondary | ICD-10-CM | POA: Diagnosis not present

## 2023-03-02 DIAGNOSIS — G4733 Obstructive sleep apnea (adult) (pediatric): Secondary | ICD-10-CM | POA: Diagnosis not present

## 2023-03-03 ENCOUNTER — Other Ambulatory Visit (HOSPITAL_BASED_OUTPATIENT_CLINIC_OR_DEPARTMENT_OTHER): Payer: Self-pay

## 2023-03-03 ENCOUNTER — Other Ambulatory Visit (HOSPITAL_COMMUNITY): Payer: Self-pay

## 2023-03-03 MED ORDER — OZEMPIC (2 MG/DOSE) 8 MG/3ML ~~LOC~~ SOPN: 2.0000 mg | PEN_INJECTOR | SUBCUTANEOUS | 3 refills | Status: DC

## 2023-03-03 MED ORDER — OZEMPIC (1 MG/DOSE) 4 MG/3ML ~~LOC~~ SOPN
1.0000 mg | PEN_INJECTOR | SUBCUTANEOUS | 0 refills | Status: DC
  Filled 2023-03-03: qty 3, 28d supply, fill #0

## 2023-03-05 ENCOUNTER — Other Ambulatory Visit (HOSPITAL_COMMUNITY): Payer: Self-pay

## 2023-03-12 ENCOUNTER — Other Ambulatory Visit (HOSPITAL_COMMUNITY): Payer: Self-pay

## 2023-03-12 DIAGNOSIS — E118 Type 2 diabetes mellitus with unspecified complications: Secondary | ICD-10-CM | POA: Diagnosis not present

## 2023-03-12 DIAGNOSIS — Z125 Encounter for screening for malignant neoplasm of prostate: Secondary | ICD-10-CM | POA: Diagnosis not present

## 2023-03-12 DIAGNOSIS — I10 Essential (primary) hypertension: Secondary | ICD-10-CM | POA: Diagnosis not present

## 2023-03-16 DIAGNOSIS — H35372 Puckering of macula, left eye: Secondary | ICD-10-CM | POA: Diagnosis not present

## 2023-03-16 DIAGNOSIS — H01015 Ulcerative blepharitis left lower eyelid: Secondary | ICD-10-CM | POA: Diagnosis not present

## 2023-03-16 DIAGNOSIS — H401211 Low-tension glaucoma, right eye, mild stage: Secondary | ICD-10-CM | POA: Diagnosis not present

## 2023-03-16 DIAGNOSIS — H43813 Vitreous degeneration, bilateral: Secondary | ICD-10-CM | POA: Diagnosis not present

## 2023-03-17 DIAGNOSIS — E559 Vitamin D deficiency, unspecified: Secondary | ICD-10-CM | POA: Diagnosis not present

## 2023-03-17 DIAGNOSIS — E118 Type 2 diabetes mellitus with unspecified complications: Secondary | ICD-10-CM | POA: Diagnosis not present

## 2023-03-17 DIAGNOSIS — Z0000 Encounter for general adult medical examination without abnormal findings: Secondary | ICD-10-CM | POA: Diagnosis not present

## 2023-03-17 DIAGNOSIS — E538 Deficiency of other specified B group vitamins: Secondary | ICD-10-CM | POA: Diagnosis not present

## 2023-03-17 DIAGNOSIS — J189 Pneumonia, unspecified organism: Secondary | ICD-10-CM | POA: Diagnosis not present

## 2023-03-17 DIAGNOSIS — F03B0 Unspecified dementia, moderate, without behavioral disturbance, psychotic disturbance, mood disturbance, and anxiety: Secondary | ICD-10-CM | POA: Diagnosis not present

## 2023-03-17 DIAGNOSIS — Z23 Encounter for immunization: Secondary | ICD-10-CM | POA: Diagnosis not present

## 2023-03-17 DIAGNOSIS — F5104 Psychophysiologic insomnia: Secondary | ICD-10-CM | POA: Diagnosis not present

## 2023-03-17 DIAGNOSIS — I10 Essential (primary) hypertension: Secondary | ICD-10-CM | POA: Diagnosis not present

## 2023-03-17 DIAGNOSIS — I2510 Atherosclerotic heart disease of native coronary artery without angina pectoris: Secondary | ICD-10-CM | POA: Diagnosis not present

## 2023-03-17 DIAGNOSIS — B372 Candidiasis of skin and nail: Secondary | ICD-10-CM | POA: Diagnosis not present

## 2023-03-25 DIAGNOSIS — H01015 Ulcerative blepharitis left lower eyelid: Secondary | ICD-10-CM | POA: Diagnosis not present

## 2023-03-25 DIAGNOSIS — H35372 Puckering of macula, left eye: Secondary | ICD-10-CM | POA: Diagnosis not present

## 2023-03-25 DIAGNOSIS — H43813 Vitreous degeneration, bilateral: Secondary | ICD-10-CM | POA: Diagnosis not present

## 2023-03-25 DIAGNOSIS — H401211 Low-tension glaucoma, right eye, mild stage: Secondary | ICD-10-CM | POA: Diagnosis not present

## 2023-03-29 ENCOUNTER — Other Ambulatory Visit (HOSPITAL_COMMUNITY): Payer: Self-pay

## 2023-03-30 ENCOUNTER — Other Ambulatory Visit (HOSPITAL_COMMUNITY): Payer: Self-pay

## 2023-03-30 MED ORDER — OZEMPIC (2 MG/DOSE) 8 MG/3ML ~~LOC~~ SOPN
2.0000 mg | PEN_INJECTOR | SUBCUTANEOUS | 3 refills | Status: DC
  Filled 2023-03-30: qty 3, 28d supply, fill #0
  Filled 2023-05-25: qty 3, 28d supply, fill #1

## 2023-03-31 ENCOUNTER — Other Ambulatory Visit (HOSPITAL_COMMUNITY): Payer: Self-pay

## 2023-04-08 ENCOUNTER — Other Ambulatory Visit (HOSPITAL_COMMUNITY): Payer: Self-pay

## 2023-04-08 MED ORDER — TRAZODONE HCL 50 MG PO TABS
50.0000 mg | ORAL_TABLET | Freq: Every day | ORAL | 3 refills | Status: DC
  Filled 2023-04-08: qty 90, 90d supply, fill #0

## 2023-04-09 ENCOUNTER — Ambulatory Visit: Payer: Medicare Other

## 2023-04-09 ENCOUNTER — Other Ambulatory Visit (HOSPITAL_COMMUNITY): Payer: Self-pay

## 2023-04-12 ENCOUNTER — Other Ambulatory Visit (HOSPITAL_COMMUNITY): Payer: Self-pay

## 2023-04-14 ENCOUNTER — Other Ambulatory Visit (HOSPITAL_COMMUNITY): Payer: Self-pay

## 2023-04-14 DIAGNOSIS — B372 Candidiasis of skin and nail: Secondary | ICD-10-CM | POA: Diagnosis not present

## 2023-04-14 DIAGNOSIS — R351 Nocturia: Secondary | ICD-10-CM | POA: Diagnosis not present

## 2023-04-14 DIAGNOSIS — N401 Enlarged prostate with lower urinary tract symptoms: Secondary | ICD-10-CM | POA: Diagnosis not present

## 2023-04-14 DIAGNOSIS — L720 Epidermal cyst: Secondary | ICD-10-CM | POA: Diagnosis not present

## 2023-04-14 DIAGNOSIS — Z23 Encounter for immunization: Secondary | ICD-10-CM | POA: Diagnosis not present

## 2023-04-14 MED ORDER — DUTASTERIDE 0.5 MG PO CAPS
0.5000 mg | ORAL_CAPSULE | Freq: Every day | ORAL | 3 refills | Status: DC
  Filled 2023-04-14: qty 90, 90d supply, fill #0

## 2023-05-10 ENCOUNTER — Other Ambulatory Visit (HOSPITAL_COMMUNITY): Payer: Self-pay

## 2023-05-10 MED ORDER — DONEPEZIL HCL 10 MG PO TABS
10.0000 mg | ORAL_TABLET | Freq: Every day | ORAL | 4 refills | Status: DC
  Filled 2023-05-10: qty 30, 30d supply, fill #0
  Filled 2023-06-08: qty 30, 30d supply, fill #1
  Filled 2023-07-12: qty 30, 30d supply, fill #2
  Filled 2023-08-23: qty 30, 30d supply, fill #3
  Filled 2023-09-20: qty 30, 30d supply, fill #4

## 2023-05-11 ENCOUNTER — Other Ambulatory Visit (HOSPITAL_COMMUNITY): Payer: Self-pay

## 2023-05-12 DIAGNOSIS — H01015 Ulcerative blepharitis left lower eyelid: Secondary | ICD-10-CM | POA: Diagnosis not present

## 2023-05-12 DIAGNOSIS — H35372 Puckering of macula, left eye: Secondary | ICD-10-CM | POA: Diagnosis not present

## 2023-05-12 DIAGNOSIS — H401211 Low-tension glaucoma, right eye, mild stage: Secondary | ICD-10-CM | POA: Diagnosis not present

## 2023-05-12 DIAGNOSIS — H43813 Vitreous degeneration, bilateral: Secondary | ICD-10-CM | POA: Diagnosis not present

## 2023-05-20 ENCOUNTER — Encounter: Payer: Self-pay | Admitting: Cardiology

## 2023-05-20 NOTE — Telephone Encounter (Signed)
error 

## 2023-05-21 DIAGNOSIS — I10 Essential (primary) hypertension: Secondary | ICD-10-CM | POA: Diagnosis not present

## 2023-05-21 DIAGNOSIS — I77810 Thoracic aortic ectasia: Secondary | ICD-10-CM | POA: Diagnosis not present

## 2023-05-22 LAB — BASIC METABOLIC PANEL
BUN/Creatinine Ratio: 19 (ref 10–24)
BUN: 14 mg/dL (ref 8–27)
CO2: 23 mmol/L (ref 20–29)
Calcium: 9 mg/dL (ref 8.6–10.2)
Chloride: 101 mmol/L (ref 96–106)
Creatinine, Ser: 0.75 mg/dL — ABNORMAL LOW (ref 0.76–1.27)
Glucose: 109 mg/dL — ABNORMAL HIGH (ref 70–99)
Potassium: 4.8 mmol/L (ref 3.5–5.2)
Sodium: 136 mmol/L (ref 134–144)
eGFR: 92 mL/min/{1.73_m2} (ref >59)

## 2023-05-25 ENCOUNTER — Other Ambulatory Visit (HOSPITAL_COMMUNITY): Payer: Self-pay

## 2023-05-26 ENCOUNTER — Other Ambulatory Visit (HOSPITAL_COMMUNITY): Payer: Self-pay

## 2023-06-10 ENCOUNTER — Other Ambulatory Visit (HOSPITAL_COMMUNITY): Payer: Self-pay

## 2023-06-15 ENCOUNTER — Other Ambulatory Visit (HOSPITAL_COMMUNITY): Payer: Self-pay

## 2023-06-15 DIAGNOSIS — I2510 Atherosclerotic heart disease of native coronary artery without angina pectoris: Secondary | ICD-10-CM | POA: Diagnosis not present

## 2023-06-15 DIAGNOSIS — E1169 Type 2 diabetes mellitus with other specified complication: Secondary | ICD-10-CM | POA: Diagnosis not present

## 2023-06-15 DIAGNOSIS — E7800 Pure hypercholesterolemia, unspecified: Secondary | ICD-10-CM | POA: Diagnosis not present

## 2023-06-15 DIAGNOSIS — I10 Essential (primary) hypertension: Secondary | ICD-10-CM | POA: Diagnosis not present

## 2023-06-15 MED ORDER — OZEMPIC (2 MG/DOSE) 8 MG/3ML ~~LOC~~ SOPN
2.0000 mg | PEN_INJECTOR | SUBCUTANEOUS | 3 refills | Status: DC
  Filled 2023-06-15: qty 3, 28d supply, fill #0
  Filled 2023-07-28: qty 3, 28d supply, fill #1
  Filled 2023-08-23: qty 3, 28d supply, fill #2

## 2023-06-21 ENCOUNTER — Other Ambulatory Visit: Payer: Self-pay | Admitting: Cardiology

## 2023-06-21 DIAGNOSIS — I10 Essential (primary) hypertension: Secondary | ICD-10-CM

## 2023-06-21 DIAGNOSIS — I77819 Aortic ectasia, unspecified site: Secondary | ICD-10-CM

## 2023-06-22 ENCOUNTER — Other Ambulatory Visit (HOSPITAL_COMMUNITY): Payer: Self-pay

## 2023-06-25 ENCOUNTER — Ambulatory Visit: Payer: Medicare Other | Attending: Cardiology | Admitting: Cardiology

## 2023-06-25 VITALS — BP 116/70 | HR 60 | Resp 16 | Ht 72.0 in | Wt 286.2 lb

## 2023-06-25 DIAGNOSIS — I77810 Thoracic aortic ectasia: Secondary | ICD-10-CM | POA: Diagnosis not present

## 2023-06-25 DIAGNOSIS — I700 Atherosclerosis of aorta: Secondary | ICD-10-CM | POA: Diagnosis not present

## 2023-06-25 DIAGNOSIS — E66812 Obesity, class 2: Secondary | ICD-10-CM

## 2023-06-25 DIAGNOSIS — Z87891 Personal history of nicotine dependence: Secondary | ICD-10-CM

## 2023-06-25 DIAGNOSIS — I2510 Atherosclerotic heart disease of native coronary artery without angina pectoris: Secondary | ICD-10-CM

## 2023-06-25 DIAGNOSIS — I10 Essential (primary) hypertension: Secondary | ICD-10-CM

## 2023-06-25 DIAGNOSIS — G4733 Obstructive sleep apnea (adult) (pediatric): Secondary | ICD-10-CM

## 2023-06-25 DIAGNOSIS — I2584 Coronary atherosclerosis due to calcified coronary lesion: Secondary | ICD-10-CM | POA: Insufficient documentation

## 2023-06-25 DIAGNOSIS — Z6838 Body mass index (BMI) 38.0-38.9, adult: Secondary | ICD-10-CM | POA: Diagnosis not present

## 2023-06-25 NOTE — Progress Notes (Unsigned)
Cardiology Office Note:  .   Date:  06/27/2023  ID:  Tyeson, Teitel 1946/05/02, MRN [id number] PCP:  Merri Brunette, MD  Former Cardiology Providers: None [facility] Providers Cardiologist:  Tessa Lerner, DO , [facility]  Electrophysiologist:  None  Click to update primary MD,subspecialty MD or APP then REFRESH:1}    Chief Complaint  Patient presents with   Ascending aorta dilation    Follow-up    History of Present Illness: .   [name] [name] is a 77 y.o. Caucasian male whose past medical history and cardiovascular risk factors includes: hypertension, lumbar spinal stenosis, obstructive sleep apnea on CPAP, mild ascending aortic aneurysm 4.4 cm (December 2023), advanced age, coronary artery calcification, former smoker, and obesity due to excess calories s/p gastric banding surgery in 2010.   Patient being followed by the practice given his history of ascending aortic aneurysm at 44 mm.  The patient is supposed to have annual CTA to evaluate his disease progression; however, it appears that this test is still pending.  Patient denies any anginal chest pain or heart failure symptoms over the last 6 month. His weight has gone up since last office visit likely secondary to stopping Ozempic due to GI side effects.  He has restarted the medication and has started to lose weight again.  His overall functional capacity remains stable and he exercises with his wife on a regular basis.  Review of Systems: .   Review of Systems  Constitutional: Positive for weight gain.  Cardiovascular:  Negative for chest pain, claudication, irregular heartbeat, leg swelling, near-syncope, orthopnea, palpitations, paroxysmal nocturnal dyspnea and syncope.  Respiratory:  Negative for shortness of breath.   Hematologic/Lymphatic: Negative for bleeding problem.    Studies Reviewed:   EKG: EKG Interpretation Date/Time:  Friday June 25 2023 09:29:54 EST Ventricular Rate:  65 PR Interval:  216 QRS  Duration:  100 QT Interval:  416 QTC Calculation: 432 R Axis:   -20  Text Interpretation: Sinus rhythm with 1st degree A-V block with Premature atrial complexes When compared with ECG of 31-Jul-2013 08:43, Premature atrial complexes are now Present PR interval has increased Confirmed by Tessa Lerner [id number]) on 06/25/2023 9:36:16 AM  Echocardiogram: 12/18/2021:  Left ventricle cavity is normal in size and wall thickness. Normal global wall motion. Normal LV systolic function with EF 53%. Doppler evidence of grade I (impaired) diastolic dysfunction, normal LAP.  The aortic root is dilated, measuring 4 cm at Sinus of Valsalva.  Proximal ascending aorta is dilated at 4.3 cm.  Left atrial cavity is severely dilated.  Right ventricle cavity is mildly dilated. Normal right ventricular function.  Mild (Grade I) mitral regurgitation.  Mild tricuspid regurgitation. Estimated pulmonary artery systolic pressure 32 mmHg.  Mild pulmonic regurgitation.  Personally reviewed and compared study from 2020. Aortic measurements are stable and unchanged. Estimated RVSP is lowed (50 mmHg then, 32 mmHg now), but mild RV dilatation is new.    Exercise myoview stress test  05/01/2019: Patient exercised on Bruce protocol for 5:00  minutes and achieved  99 % of Max Predicted HR (Target HR was >85% MPHR) and  6.79 METS. Stress symptoms included fatigue and dyspnea. Normal BP response.  Exercise capacity was low. The perfusion imaging study demonstrates mild diaphragmatic attenuation artifact and normal perfusion in all vascular territories. Dynamic gated images reveal normal endocardial thickening and left ventricular systolic function. LVEF calculated was   45% but visually appeared to be normal. This is a low risk study.  US Carotid Bilateral [facility] 05/01/2015: Normal. Negative for stenosis.    ABI 09/29/2019: This exam reveals normal perfusion of the right lower extremity (ABI 1.19 ) and normal perfusion of the left  lower extremity (ABI 1.26).    Sleep Study: Positive for Sleep Apnea; Uses CPAP  RADIOLOGY: CTA chest with contrast 01/29/2020: Stable mild aneurysmal dilatation of the ascending thoracic aorta, maximal diameter 4.4 cm. Aortic Atherosclerosis (ICD10-I70.0). See report for additional details.   CTA chest aorta with and without contrast  05/30/2021: Unchanged fusiform aneurysmal changes of the ascending thoracic aorta measuring up to 4.4 cm. Recommend annual imaging followup by CTA or MRA.  Coronary and aortic atherosclerosis (ICD10-I70.0). See report for additional details.   CT ANGIO CHEST AORTA W/ & OR WO/CM & GATING 07/03/2022: Unchanged fusiform aneurysmal changes of the ascending thoracic aorta measuring up to 4.4 cm. Recommend annual imaging followup by CTA or MRA. This recommendation follows 2010 Aortic aneurysm NOS (ICD10-I71.9) Coronary and aortic atherosclerosis (ICD10-I70.0). See report for additional details  Risk Assessment/Calculations:   N/A   Labs:       Latest Ref Rng & Units 05/16/2020   10:38 AM 07/31/2013    8:45 AM 07/21/2011    5:00 AM  CBC  WBC 4.0 - 10.5 K/uL 6.7  6.2  9.3   Hemoglobin 13.0 - 17.0 g/dL 19.1  47.8  29.5   Hematocrit 39.0 - 52.0 % 39.3  39.6  29.9   Platelets 150 - 400 K/uL 213  174  122        Latest Ref Rng & Units 05/21/2023   11:18 AM 06/30/2022    8:52 AM 05/19/2021    1:38 PM  BMP  Glucose 70 - 99 mg/dL 621  98  308   BUN 8 - 27 mg/dL 14  11  10    Creatinine 0.76 - 1.27 mg/dL 6.57  8.46  9.62   BUN/Creat Ratio 10 - 24 19  15  12    Sodium 134 - 144 mmol/L 136  135  137   Potassium 3.5 - 5.2 mmol/L 4.8  4.8  5.2   Chloride 96 - 106 mmol/L 101  100  101   CO2 20 - 29 mmol/L 23  20  23    Calcium 8.6 - 10.2 mg/dL 9.0  8.9  9.4       Latest Ref Rng & Units 05/21/2023   11:18 AM 06/30/2022    8:52 AM 05/19/2021    1:38 PM  CMP  Glucose 70 - 99 mg/dL 952  98  841   BUN 8 - 27 mg/dL 14  11  10    Creatinine 0.76 - 1.27 mg/dL  3.24  4.01  0.27   Sodium 134 - 144 mmol/L 136  135  137   Potassium 3.5 - 5.2 mmol/L 4.8  4.8  5.2   Chloride 96 - 106 mmol/L 101  100  101   CO2 20 - 29 mmol/L 23  20  23    Calcium 8.6 - 10.2 mg/dL 9.0  8.9  9.4     No results found for: "CHOL", "HDL", "LDLCALC", "LDLDIRECT", "TRIG", "CHOLHDL" No results for input(s): "LIPOA" in the last 8760 hours. No components found for: "NTPROBNP" No results for input(s): "PROBNP" in the last 8760 hours. No results for input(s): "TSH" in the last 8760 hours.  Lipid profiles: February 2022 Lipid profile: Total cholesterol 103, triglycerides 94, HDL 34, non-HDL 51 September 2023: Total cholesterol 93, triglycerides 96, HDL 34, LDL calculated  40, non-HDL 59   Physical Exam:    Today's Vitals   06/25/23 0917  BP: 116/70  Pulse: 60  Resp: 16  SpO2: 94%  Weight: 286 lb 3.2 oz (129.8 kg)  Height: 6' (1.829 m)   Body mass index is 38.82 kg/m. Wt Readings from Last 3 Encounters:  06/25/23 286 lb 3.2 oz (129.8 kg)  12/22/22 259 lb (117.5 kg)  10/04/22 245 lb (111.1 kg)    Physical Exam  Constitutional: No distress.  hemodynamically stable  Neck: No JVD present.  Cardiovascular: Normal rate, regular rhythm, S1 normal and S2 normal. Exam reveals no gallop, no S3 and no S4.  No murmur heard. Pulmonary/Chest: Effort normal and breath sounds normal. No stridor. He has no wheezes. He has no rales.  Abdominal: Soft. Bowel sounds are normal. He exhibits no distension. There is no abdominal tenderness.  Musculoskeletal:        General: No edema.     Cervical back: Neck supple.  Neurological: He is alert and oriented to person, place, and time. He has intact cranial nerves (2-12).  Skin: Skin is warm.     Impression & Recommendation(s):  Impression:   ICD-10-CM   1. Ascending aorta dilation (HCC)  I77.810 EKG 12-Lead    2. Essential hypertension  I10     3. Coronary atherosclerosis due to calcified coronary lesion of native artery   I25.10    I25.84     4. Atherosclerosis of aorta (HCC)  I70.0     5. OSA on CPAP  G47.33     6. Former smoker  Z87.891     7. Class 2 severe obesity due to excess calories with serious comorbidity and body mass index (BMI) of 38.0 to 38.9 in adult [facility])  Z61.096    E66.01    Z68.38        Recommendation(s):  Ascending aorta dilation (HCC) Aortic dimensions at 44 mm. Supposed to have a CT scan prior to today's office visit which is still pending. Continue conservative management. Office and home blood pressures are very well-controlled.  Essential hypertension Office and home blood pressures are well-controlled. Continue amlodipine 5 mg p.o. daily. Continue Avapro 75 mg p.o. daily. Continue Aldactone 25 mg p.o. daily  Coronary atherosclerosis due to calcified coronary lesion of native artery Atherosclerosis of aorta (HCC) Continue ASA and statin therapy  Prior echo and stress test results reviewed during today encounter.  No new chest pain / anginal discomfort.   OSA on CPAP Endorses compliance w/ device therapy.  Class 2 severe obesity due to excess calories with serious comorbidity and body mass index (BMI) of 38.0 to 38.9 in adult [facility]) Body mass index is 38.82 kg/m. I reviewed with him importance of diet, regular physical activity/exercise, weight loss.   Patient is educated on the importance of increasing physical activity gradually as tolerated with a goal of moderate intensity exercise for 30 minutes a day 5 days a week.  Orders Placed:  Orders Placed This Encounter  Procedures   EKG 12-Lead   Final Medication List:   No orders of the defined types were placed in this encounter.   Medications Discontinued During This Encounter  Medication Reason   dutasteride (AVODART) 0.5 MG capsule Patient Preference   hydrocortisone (ANUSOL-HC) 25 MG suppository Patient Preference   ketorolac (ACULAR) 0.5 % ophthalmic solution No longer needed (for PRN medications)    ketorolac (ACULAR) 0.5 % ophthalmic solution No longer needed (for PRN medications)   latanoprost (XALATAN)  0.005 % ophthalmic solution No longer needed (for PRN medications)   levofloxacin (LEVAQUIN) 750 MG tablet Patient Preference   moxifloxacin (VIGAMOX) 0.5 % ophthalmic solution No longer needed (for PRN medications)   moxifloxacin (VIGAMOX) 0.5 % ophthalmic solution No longer needed (for PRN medications)   ondansetron (ZOFRAN-ODT) 4 MG disintegrating tablet Patient Preference   prednisoLONE acetate (PRED FORTE) 1 % ophthalmic suspension No longer needed (for PRN medications)   prednisoLONE acetate (PRED FORTE) 1 % ophthalmic suspension No longer needed (for PRN medications)   Semaglutide, 1 MG/DOSE, (OZEMPIC, 1 MG/DOSE,) 4 MG/3ML SOPN    Semaglutide, 2 MG/DOSE, (OZEMPIC, 2 MG/DOSE,) 8 MG/3ML SOPN      Current Outpatient Medications:    acetaminophen (TYLENOL) 500 MG tablet, Take 1,000 mg by mouth every 6 (six) hours as needed for moderate pain., Disp: , Rfl:    alfuzosin (UROXATRAL) 10 MG 24 hr tablet, Take 10 mg by mouth daily., Disp: , Rfl:    alfuzosin (UROXATRAL) 10 MG 24 hr tablet, Take 1 tablet (10 mg total) by mouth daily., Disp: 90 tablet, Rfl: 3   amLODipine (NORVASC) 5 MG tablet, Take 1 tablet by mouth daily., Disp: , Rfl:    Ascorbic Acid (VITAMIN C) 500 MG CAPS, See admin instructions., Disp: , Rfl:    aspirin EC 81 MG tablet, Take 81 mg by mouth daily., Disp: , Rfl:    Cholecalciferol (VITAMIN D3) 125 MCG (5000 UT) TABS, Take 5,000 Units by mouth daily. , Disp: , Rfl:    clotrimazole-betamethasone (LOTRISONE) cream, Apply 1 application topically 2 (two) times daily., Disp: , Rfl:    Cyanocobalamin (B-12) 1000 MCG TABS, 3,000 mcg., Disp: , Rfl:    docusate sodium (COLACE) 100 MG capsule, Take 200 mg by mouth daily as needed for mild constipation., Disp: , Rfl:    donepezil (ARICEPT) 10 MG tablet, Take 1 tablet by mouth once daily at bedtime., Disp: 30 tablet, Rfl: 4    irbesartan (AVAPRO) 75 MG tablet, TAKE 1 TABLET BY MOUTH EVERY DAY, Disp: 90 tablet, Rfl: 3   memantine (NAMENDA) 10 MG tablet, Take 1 tablet (10 mg total) by mouth 2 (two) times daily. Start after finishing 5 mg tablets., Disp: 60 tablet, Rfl: 11   Multiple Vitamin (MULTIVITAMIN) tablet, Take 1 tablet by mouth daily., Disp: , Rfl:    pantoprazole (PROTONIX) 20 MG tablet, Take 20 mg by mouth daily. , Disp: , Rfl:    rosuvastatin (CRESTOR) 10 MG tablet, Take 10 mg by mouth daily., Disp: , Rfl:    Semaglutide, 2 MG/DOSE, (OZEMPIC, 2 MG/DOSE,) 8 MG/3ML SOPN, Inject 2 mg under the skin once weekly, Disp: 3 mL, Rfl: 3   spironolactone (ALDACTONE) 25 MG tablet, TAKE 1 TABLET (25 MG TOTAL) BY MOUTH IN THE MORNING (Patient taking differently: Take 12.5 mg by mouth once. TAKE 1 TABLET (25 MG TOTAL) BY MOUTH IN THE MORNING.), Disp: 90 tablet, Rfl: 0   traZODone (DESYREL) 50 MG tablet, Take 1 tablet (50 mg total) by mouth daily., Disp: 90 tablet, Rfl: 3   TURMERIC-GINGER PO, Take 1 tablet by mouth daily., Disp: , Rfl:    Zinc 50 MG TABS, Take 50 mg by mouth daily. , Disp: , Rfl:    Semaglutide, 2 MG/DOSE, (OZEMPIC, 2 MG/DOSE,) 8 MG/3ML SOPN, Inject 2 mg into the skin once weekly., Disp: 9 mL, Rfl: 3   Semaglutide, 2 MG/DOSE, (OZEMPIC, 2 MG/DOSE,) 8 MG/3ML SOPN, Inject 2 mg into the skin once a week., Disp: 3 mL,  Rfl: 3   Semaglutide, 2 MG/DOSE, (OZEMPIC, 2 MG/DOSE,) 8 MG/3ML SOPN, Inject 2 mg into the skin once a week., Disp: 9 mL, Rfl: 3   Semaglutide, 2 MG/DOSE, (OZEMPIC, 2 MG/DOSE,) 8 MG/3ML SOPN, Inject 2 mg into the skin once a week., Disp: 3 mL, Rfl: 3  Consent:   None  Disposition:   6 months sooner if needed.   His questions and concerns were addressed to his satisfaction. He voices understanding of the recommendations provided during this encounter.    Signed, Tessa Lerner, DO, [facility]  [facility]  [address] [place], [place] [address] 06/27/2023 1:09 PM

## 2023-06-25 NOTE — Patient Instructions (Signed)
Medication Instructions:  Your physician recommends that you continue on your current medications as directed. Please refer to the Current Medication list given to you today.  *If you need a refill on your cardiac medications before your next appointment, please call your pharmacy*  Lab Work: None ordered today. If you have labs (blood work) drawn today and your tests are completely normal, you will receive your results only by: MyChart Message (if you have MyChart) OR A paper copy in the mail If you have any lab test that is abnormal or we need to change your treatment, we will call you to review the results.  Testing/Procedures: CT scan to be scheduled soon.   Follow-Up: At [facility], you and your health needs are our priority.  As part of our continuing mission to provide you with exceptional heart care, we have created designated Provider Care Teams.  These Care Teams include your primary Cardiologist (physician) and Advanced Practice Providers (APPs -  Physician Assistants and Nurse Practitioners) who all work together to provide you with the care you need, when you need it.  We recommend signing up for the patient portal called "MyChart".  Sign up information is provided on this After Visit Summary.  MyChart is used to connect with patients for Virtual Visits (Telemedicine).  Patients are able to view lab/test results, encounter notes, upcoming appointments, etc.  Non-urgent messages can be sent to your provider as well.   To learn more about what you can do with MyChart, go to [email or web address].    Your next appointment:   6 month(s)  The format for your next appointment:   In Person  Provider:   Tessa Lerner, DO {

## 2023-06-27 ENCOUNTER — Encounter: Payer: Self-pay | Admitting: Cardiology

## 2023-06-28 ENCOUNTER — Other Ambulatory Visit (HOSPITAL_COMMUNITY): Payer: Self-pay

## 2023-06-28 MED ORDER — MEMANTINE HCL 10 MG PO TABS
ORAL_TABLET | ORAL | 11 refills | Status: AC
  Filled 2023-06-28: qty 60, 30d supply, fill #0
  Filled 2023-07-28: qty 60, 30d supply, fill #1
  Filled 2023-09-05: qty 60, 30d supply, fill #2
  Filled 2023-10-07: qty 60, 30d supply, fill #3
  Filled 2023-11-10: qty 60, 30d supply, fill #4
  Filled 2023-12-13: qty 60, 30d supply, fill #5
  Filled 2024-01-10: qty 60, 30d supply, fill #6
  Filled 2024-02-13: qty 60, 30d supply, fill #7
  Filled 2024-03-12: qty 60, 30d supply, fill #8
  Filled 2024-04-11: qty 60, 30d supply, fill #9
  Filled 2024-05-18: qty 60, 30d supply, fill #10
  Filled 2024-06-15: qty 60, 30d supply, fill #11

## 2023-07-02 ENCOUNTER — Other Ambulatory Visit (HOSPITAL_COMMUNITY): Payer: Self-pay

## 2023-07-13 ENCOUNTER — Other Ambulatory Visit (HOSPITAL_COMMUNITY): Payer: Self-pay

## 2023-07-13 ENCOUNTER — Ambulatory Visit (HOSPITAL_COMMUNITY): Payer: Medicare Other

## 2023-07-13 MED ORDER — DUTASTERIDE 0.5 MG PO CAPS
ORAL_CAPSULE | ORAL | 3 refills | Status: AC
  Filled 2023-07-13: qty 90, 90d supply, fill #0
  Filled 2023-10-11: qty 90, 90d supply, fill #1
  Filled 2023-12-30: qty 90, 90d supply, fill #2
  Filled 2024-04-05: qty 90, 90d supply, fill #3

## 2023-07-14 ENCOUNTER — Other Ambulatory Visit (HOSPITAL_COMMUNITY): Payer: Self-pay

## 2023-07-16 ENCOUNTER — Ambulatory Visit (HOSPITAL_COMMUNITY)
Admission: RE | Admit: 2023-07-16 | Discharge: 2023-07-16 | Disposition: A | Discharge status: Home / Self Care | Payer: Medicare Other | Source: Ambulatory Visit | Attending: Cardiology | Admitting: Cardiology

## 2023-07-16 DIAGNOSIS — I10 Essential (primary) hypertension: Secondary | ICD-10-CM | POA: Diagnosis not present

## 2023-07-16 DIAGNOSIS — I77810 Thoracic aortic ectasia: Secondary | ICD-10-CM | POA: Insufficient documentation

## 2023-07-16 DIAGNOSIS — K8020 Calculus of gallbladder without cholecystitis without obstruction: Secondary | ICD-10-CM | POA: Diagnosis not present

## 2023-07-16 DIAGNOSIS — I7121 Aneurysm of the ascending aorta, without rupture: Secondary | ICD-10-CM | POA: Diagnosis not present

## 2023-07-16 DIAGNOSIS — I2510 Atherosclerotic heart disease of native coronary artery without angina pectoris: Secondary | ICD-10-CM | POA: Diagnosis not present

## 2023-07-16 LAB — POCT I-STAT CREATININE: Creatinine, Ser: 0.8 mg/dL (ref 0.61–1.24)

## 2023-07-16 MED ORDER — IOHEXOL 350 MG/ML SOLN
75.0000 mL | Freq: Once | INTRAVENOUS | Status: AC | PRN
  Administered 2023-07-16: 75 mL via INTRAVENOUS

## 2023-07-20 ENCOUNTER — Other Ambulatory Visit: Payer: Self-pay | Admitting: Cardiology

## 2023-07-20 DIAGNOSIS — I10 Essential (primary) hypertension: Secondary | ICD-10-CM

## 2023-07-20 DIAGNOSIS — I77810 Thoracic aortic ectasia: Secondary | ICD-10-CM

## 2023-07-21 ENCOUNTER — Other Ambulatory Visit: Payer: Self-pay

## 2023-07-21 DIAGNOSIS — I7121 Aneurysm of the ascending aorta, without rupture: Secondary | ICD-10-CM

## 2023-07-28 ENCOUNTER — Other Ambulatory Visit (HOSPITAL_COMMUNITY): Payer: Self-pay

## 2023-07-29 ENCOUNTER — Other Ambulatory Visit: Payer: Self-pay

## 2023-08-09 DIAGNOSIS — H01015 Ulcerative blepharitis left lower eyelid: Secondary | ICD-10-CM | POA: Diagnosis not present

## 2023-08-09 DIAGNOSIS — H401211 Low-tension glaucoma, right eye, mild stage: Secondary | ICD-10-CM | POA: Diagnosis not present

## 2023-08-09 DIAGNOSIS — H43813 Vitreous degeneration, bilateral: Secondary | ICD-10-CM | POA: Diagnosis not present

## 2023-08-09 DIAGNOSIS — H35372 Puckering of macula, left eye: Secondary | ICD-10-CM | POA: Diagnosis not present

## 2023-08-23 ENCOUNTER — Other Ambulatory Visit (HOSPITAL_COMMUNITY): Payer: Self-pay

## 2023-09-06 ENCOUNTER — Other Ambulatory Visit (HOSPITAL_COMMUNITY): Payer: Self-pay

## 2023-09-14 ENCOUNTER — Other Ambulatory Visit (HOSPITAL_COMMUNITY): Payer: Self-pay

## 2023-09-14 DIAGNOSIS — E1169 Type 2 diabetes mellitus with other specified complication: Secondary | ICD-10-CM | POA: Diagnosis not present

## 2023-09-14 DIAGNOSIS — I10 Essential (primary) hypertension: Secondary | ICD-10-CM | POA: Diagnosis not present

## 2023-09-14 DIAGNOSIS — I2510 Atherosclerotic heart disease of native coronary artery without angina pectoris: Secondary | ICD-10-CM | POA: Diagnosis not present

## 2023-09-14 DIAGNOSIS — E7800 Pure hypercholesterolemia, unspecified: Secondary | ICD-10-CM | POA: Diagnosis not present

## 2023-09-14 MED ORDER — OZEMPIC (2 MG/DOSE) 8 MG/3ML ~~LOC~~ SOPN
2.0000 mg | PEN_INJECTOR | SUBCUTANEOUS | 11 refills | Status: AC
  Filled 2023-09-14 – 2023-09-15 (×2): qty 3, 28d supply, fill #0
  Filled 2023-10-21: qty 3, 28d supply, fill #1
  Filled 2023-11-22: qty 3, 28d supply, fill #2
  Filled 2023-12-18 (×2): qty 3, 28d supply, fill #3
  Filled 2024-01-17: qty 3, 28d supply, fill #4
  Filled 2024-02-13: qty 3, 28d supply, fill #5
  Filled 2024-03-12: qty 3, 28d supply, fill #6
  Filled 2024-04-06: qty 3, 28d supply, fill #7
  Filled 2024-05-07: qty 3, 28d supply, fill #8
  Filled 2024-06-05: qty 3, 28d supply, fill #9
  Filled 2024-07-02: qty 3, 28d supply, fill #10
  Filled 2024-07-28: qty 3, 28d supply, fill #11

## 2023-09-15 ENCOUNTER — Other Ambulatory Visit (HOSPITAL_COMMUNITY): Payer: Self-pay

## 2023-09-20 DIAGNOSIS — L821 Other seborrheic keratosis: Secondary | ICD-10-CM | POA: Diagnosis not present

## 2023-09-20 DIAGNOSIS — L728 Other follicular cysts of the skin and subcutaneous tissue: Secondary | ICD-10-CM | POA: Diagnosis not present

## 2023-10-21 ENCOUNTER — Other Ambulatory Visit (HOSPITAL_COMMUNITY): Payer: Self-pay

## 2023-10-22 ENCOUNTER — Other Ambulatory Visit (HOSPITAL_COMMUNITY): Payer: Self-pay

## 2023-10-25 ENCOUNTER — Other Ambulatory Visit (HOSPITAL_COMMUNITY): Payer: Self-pay

## 2023-10-25 MED ORDER — DONEPEZIL HCL 10 MG PO TABS
10.0000 mg | ORAL_TABLET | Freq: Every day | ORAL | 4 refills | Status: DC
  Filled 2023-10-25: qty 30, 30d supply, fill #0
  Filled 2023-11-30: qty 30, 30d supply, fill #1
  Filled 2023-12-30: qty 30, 30d supply, fill #2
  Filled 2024-01-25: qty 30, 30d supply, fill #3
  Filled 2024-02-28 (×2): qty 30, 30d supply, fill #4

## 2023-11-04 ENCOUNTER — Other Ambulatory Visit (HOSPITAL_COMMUNITY): Payer: Self-pay

## 2023-11-04 DIAGNOSIS — R197 Diarrhea, unspecified: Secondary | ICD-10-CM | POA: Diagnosis not present

## 2023-11-04 DIAGNOSIS — B372 Candidiasis of skin and nail: Secondary | ICD-10-CM | POA: Diagnosis not present

## 2023-11-04 MED ORDER — ALIGN 10 MG PO CAPS
1.0000 | ORAL_CAPSULE | Freq: Every day | ORAL | 0 refills | Status: DC
  Filled 2023-11-04: qty 28, 28d supply, fill #0

## 2023-11-04 MED ORDER — LOPERAMIDE HCL 2 MG PO CAPS
2.0000 mg | ORAL_CAPSULE | Freq: Every evening | ORAL | 5 refills | Status: AC
  Filled 2023-11-04: qty 30, 30d supply, fill #0
  Filled 2023-11-30: qty 30, 30d supply, fill #1
  Filled 2024-01-25: qty 30, 30d supply, fill #2
  Filled 2024-02-22: qty 30, 30d supply, fill #3
  Filled 2024-03-23: qty 30, 30d supply, fill #4

## 2023-11-10 ENCOUNTER — Other Ambulatory Visit (HOSPITAL_COMMUNITY): Payer: Self-pay

## 2023-11-18 DIAGNOSIS — R197 Diarrhea, unspecified: Secondary | ICD-10-CM | POA: Diagnosis not present

## 2023-11-22 ENCOUNTER — Other Ambulatory Visit (HOSPITAL_COMMUNITY): Payer: Self-pay

## 2023-12-15 DIAGNOSIS — E7800 Pure hypercholesterolemia, unspecified: Secondary | ICD-10-CM | POA: Diagnosis not present

## 2023-12-15 DIAGNOSIS — I2510 Atherosclerotic heart disease of native coronary artery without angina pectoris: Secondary | ICD-10-CM | POA: Diagnosis not present

## 2023-12-15 DIAGNOSIS — E1169 Type 2 diabetes mellitus with other specified complication: Secondary | ICD-10-CM | POA: Diagnosis not present

## 2023-12-15 DIAGNOSIS — I10 Essential (primary) hypertension: Secondary | ICD-10-CM | POA: Diagnosis not present

## 2023-12-18 ENCOUNTER — Other Ambulatory Visit (HOSPITAL_COMMUNITY): Payer: Self-pay

## 2023-12-21 ENCOUNTER — Other Ambulatory Visit (HOSPITAL_COMMUNITY): Payer: Self-pay

## 2023-12-21 DIAGNOSIS — F419 Anxiety disorder, unspecified: Secondary | ICD-10-CM | POA: Diagnosis not present

## 2023-12-21 DIAGNOSIS — K529 Noninfective gastroenteritis and colitis, unspecified: Secondary | ICD-10-CM | POA: Diagnosis not present

## 2023-12-21 DIAGNOSIS — G4720 Circadian rhythm sleep disorder, unspecified type: Secondary | ICD-10-CM | POA: Diagnosis not present

## 2023-12-21 MED ORDER — ZOLPIDEM TARTRATE ER 6.25 MG PO TBCR
6.2500 mg | EXTENDED_RELEASE_TABLET | Freq: Every evening | ORAL | 2 refills | Status: DC | PRN
  Filled 2023-12-21: qty 30, 30d supply, fill #0

## 2023-12-21 MED ORDER — SERTRALINE HCL 50 MG PO TABS
ORAL_TABLET | ORAL | 4 refills | Status: AC
  Filled 2023-12-21: qty 90, 90d supply, fill #0
  Filled 2024-03-21: qty 90, 90d supply, fill #1
  Filled 2024-06-15: qty 90, 90d supply, fill #2

## 2023-12-22 DIAGNOSIS — K529 Noninfective gastroenteritis and colitis, unspecified: Secondary | ICD-10-CM | POA: Diagnosis not present

## 2023-12-29 ENCOUNTER — Other Ambulatory Visit (HOSPITAL_COMMUNITY): Payer: Self-pay

## 2023-12-29 MED ORDER — AZITHROMYCIN 500 MG PO TABS
500.0000 mg | ORAL_TABLET | Freq: Two times a day (BID) | ORAL | 0 refills | Status: DC
  Filled 2023-12-29: qty 4, 2d supply, fill #0

## 2024-01-17 ENCOUNTER — Other Ambulatory Visit (HOSPITAL_COMMUNITY): Payer: Self-pay

## 2024-01-25 DIAGNOSIS — F419 Anxiety disorder, unspecified: Secondary | ICD-10-CM | POA: Diagnosis not present

## 2024-01-25 DIAGNOSIS — G4720 Circadian rhythm sleep disorder, unspecified type: Secondary | ICD-10-CM | POA: Diagnosis not present

## 2024-01-25 DIAGNOSIS — K529 Noninfective gastroenteritis and colitis, unspecified: Secondary | ICD-10-CM | POA: Diagnosis not present

## 2024-01-26 ENCOUNTER — Other Ambulatory Visit (HOSPITAL_COMMUNITY): Payer: Self-pay

## 2024-02-22 ENCOUNTER — Encounter (HOSPITAL_COMMUNITY): Payer: Self-pay

## 2024-02-22 ENCOUNTER — Other Ambulatory Visit (HOSPITAL_COMMUNITY): Payer: Self-pay

## 2024-02-28 ENCOUNTER — Ambulatory Visit: Attending: Cardiology | Admitting: Cardiology

## 2024-02-28 ENCOUNTER — Encounter: Payer: Self-pay | Admitting: Cardiology

## 2024-02-28 ENCOUNTER — Other Ambulatory Visit (HOSPITAL_COMMUNITY): Payer: Self-pay

## 2024-02-28 VITALS — BP 118/78 | HR 69 | Resp 16 | Ht 72.0 in | Wt 245.2 lb

## 2024-02-28 DIAGNOSIS — I10 Essential (primary) hypertension: Secondary | ICD-10-CM | POA: Diagnosis not present

## 2024-02-28 DIAGNOSIS — G4733 Obstructive sleep apnea (adult) (pediatric): Secondary | ICD-10-CM | POA: Diagnosis not present

## 2024-02-28 DIAGNOSIS — R0602 Shortness of breath: Secondary | ICD-10-CM

## 2024-02-28 DIAGNOSIS — I77810 Thoracic aortic ectasia: Secondary | ICD-10-CM | POA: Diagnosis not present

## 2024-02-28 DIAGNOSIS — I700 Atherosclerosis of aorta: Secondary | ICD-10-CM | POA: Diagnosis not present

## 2024-02-28 DIAGNOSIS — I2584 Coronary atherosclerosis due to calcified coronary lesion: Secondary | ICD-10-CM | POA: Diagnosis not present

## 2024-02-28 DIAGNOSIS — I2510 Atherosclerotic heart disease of native coronary artery without angina pectoris: Secondary | ICD-10-CM | POA: Diagnosis not present

## 2024-02-28 DIAGNOSIS — Z6833 Body mass index (BMI) 33.0-33.9, adult: Secondary | ICD-10-CM

## 2024-02-28 DIAGNOSIS — E6609 Other obesity due to excess calories: Secondary | ICD-10-CM

## 2024-02-28 DIAGNOSIS — E66811 Obesity, class 1: Secondary | ICD-10-CM

## 2024-02-28 MED ORDER — AZITHROMYCIN 500 MG PO TABS
500.0000 mg | ORAL_TABLET | Freq: Two times a day (BID) | ORAL | 0 refills | Status: DC
  Filled 2024-02-28: qty 4, 2d supply, fill #0

## 2024-02-28 NOTE — Patient Instructions (Signed)
 Medication Instructions:  No medication changes were made at this visit. Continue current regimen.   *If you need a refill on your cardiac medications before your next appointment, please call your pharmacy*  Lab Work: None ordered today. If you have labs (blood work) drawn today and your tests are completely normal, you will receive your results only by: MyChart Message (if you have MyChart) OR A paper copy in the mail If you have any lab test that is abnormal or we need to change your treatment, we will call you to review the results.  Testing/Procedures: Your physician has requested that you have an echocardiogram at the next available appointment slot. Echocardiography is a painless test that uses sound waves to create images of your heart. It provides your doctor with information about the size and shape of your heart and how well your heart's chambers and valves are working. This procedure takes approximately one hour. There are no restrictions for this procedure. Please do NOT wear cologne, perfume, aftershave, or lotions (deodorant is allowed). Please arrive 15 minutes prior to your appointment time.  Please note: We ask at that you not bring children with you during ultrasound (echo/ vascular) testing. Due to room size and safety concerns, children are not allowed in the ultrasound rooms during exams. Our front office staff cannot provide observation of children in our lobby area while testing is being conducted. An adult accompanying a patient to their appointment will only be allowed in the ultrasound room at the discretion of the ultrasound technician under special circumstances. We apologize for any inconvenience.   Follow-Up: At [facility], you and your health needs are our priority.  As part of our continuing mission to provide you with exceptional heart care, our providers are all part of one team.  This team includes your primary Cardiologist (physician) and Advanced  Practice Providers or APPs (Physician Assistants and Nurse Practitioners) who all work together to provide you with the care you need, when you need it.  Your next appointment:   6 month(s)  Provider:   Madonna Large, DO

## 2024-02-28 NOTE — Progress Notes (Signed)
 Cardiology Office Note:  .   ID:  [name] [name], DOB 12-25-1945, MRN [id number] PCP:  Clarice Nottingham, MD  Former Cardiology Providers: None [facility] Providers Cardiologist:  Madonna Large, DO , [facility]  Electrophysiologist:  None  Click to update primary MD,subspecialty MD or APP then REFRESH:1}    Chief Complaint  Patient presents with   Ascending aorta dilation   Follow-up    History of Present Illness: .   [name] [name] is a 78 y.o. Caucasian male whose past medical history and cardiovascular risk factors includes: hypertension, lumbar spinal stenosis, obstructive sleep apnea on CPAP, mild ascending aortic aneurysm 4.4 cm (December 2023), advanced age, coronary artery calcification, former smoker, and obesity due to excess calories s/p gastric banding surgery in 2010.   Patient has known history of dilated ascending aortic root and proximal ascending aorta.  Aortic root as per the echocardiogram in June 2023 was reported to be 40 mm and the proximal ascending aorta as per the CTA chest aorta protocol is 46mm as of January 2025.  Patient is accompanied by his wife at today's office visit who provides collateral history given his underlying cognitive impairment.  He has experienced significant weight loss of 41 pounds, attributed to the use of Ozempic . Despite the weight loss, he has persistent shortness of breath.  He denies orthopnea, PND, or lower extremity swelling.  Wife is noted that the shortness of breath is more noticeable while he is at rest and before going to sleep.  Once he is asleep his breathing is perceived to be normal.  Spironolactone  has been discontinued, states did not need it. Continues to walk at least 0.5 miles per day.   Review of Systems: .   Review of Systems  Constitutional: Positive for weight loss.  Cardiovascular:  Negative for chest pain, claudication, irregular heartbeat, leg swelling, near-syncope, orthopnea, palpitations, paroxysmal  nocturnal dyspnea and syncope.  Respiratory:  Positive for shortness of breath (chronic and stable).   Hematologic/Lymphatic: Negative for bleeding problem.    Studies Reviewed:   EKG: EKG Interpretation Date/Time:  Monday February 28 2024 14:45:14 EDT Ventricular Rate:  69 PR Interval:  194 QRS Duration:  96 QT Interval:  398 QTC Calculation: 426 R Axis:   -26  Text Interpretation: Normal sinus rhythm Moderate voltage criteria for LVH, may be normal variant ( R in aVL , Cornell product ) When compared with ECG of 25-Jun-2023 09:29, Premature atrial complexes are no longer Present Confirmed by Large Madonna [phone]) on 02/28/2024 3:00:52 PM  Echocardiogram: 12/18/2021:  Left ventricle cavity is normal in size and wall thickness. Normal global wall motion. Normal LV systolic function with EF 53%. Doppler evidence of grade I (impaired) diastolic dysfunction, normal LAP.  The aortic root is dilated, measuring 4 cm at Sinus of Valsalva.  Proximal ascending aorta is dilated at 4.3 cm.  Left atrial cavity is severely dilated.  Right ventricle cavity is mildly dilated. Normal right ventricular function.  Mild (Grade I) mitral regurgitation.  Mild tricuspid regurgitation. Estimated pulmonary artery systolic pressure 32 mmHg.  Mild pulmonic regurgitation.  Personally reviewed and compared study from 2020. Aortic measurements are stable and unchanged. Estimated RVSP is lowed (50 mmHg then, 32 mmHg now), but mild RV dilatation is new.    Exercise myoview  stress test  05/01/2019: Patient exercised on Bruce protocol for 5:00  minutes and achieved  99 % of Max Predicted HR (Target HR was >85% MPHR) and  6.79 METS. Stress symptoms included fatigue  and dyspnea. Normal BP response.  Exercise capacity was low. The perfusion imaging study demonstrates mild diaphragmatic attenuation artifact and normal perfusion in all vascular territories. Dynamic gated images reveal normal endocardial thickening and left  ventricular systolic function. LVEF calculated was   45% but visually appeared to be normal. This is a low risk study.   US  Carotid Bilateral Cone 05/01/2015: Normal. Negative for stenosis.    ABI 09/29/2019: This exam reveals normal perfusion of the right lower extremity (ABI 1.19 ) and normal perfusion of the left lower extremity (ABI 1.26).    Sleep Study: Positive for Sleep Apnea; Uses CPAP  RADIOLOGY: CTA chest with contrast 01/29/2020: Stable mild aneurysmal dilatation of the ascending thoracic aorta, maximal diameter 4.4 cm. See report for additional details.   CTA chest aorta with and without contrast  05/30/2021: Unchanged fusiform aneurysmal changes of the ascending thoracic aorta measuring up to 4.4 cm. See report for additional details.   CT ANGIO CHEST AORTA W/ & OR WO/CM & GATING 07/03/2022: Unchanged fusiform aneurysmal changes of the ascending thoracic aorta measuring up to 4.4 cm. Coronary and aortic atherosclerosis (ICD10-I70.0).See report for additional details  CT chest aorta with and without contrast January 2025: 1. Stable uncomplicated fusiform aneurysmal dilatation of the ascending thoracic aorta measuring up to 46 mm, unchanged compared to the 12/2018 examination as well as remote noncontrast examination performed 08/2007 by my direct remeasurement. Aortic aneurysm NOS (ICD10-I71.9). 2. Cardiomegaly with enlarged caliber of the main pulmonary artery compatible with known history of pulmonary arterial hypertension. 3. Coronary artery calcifications. Aortic Atherosclerosis (ICD10-I70.0). 4. Cholelithiasis without evidence of acute cholecystitis.  Risk Assessment/Calculations:   N/A   Labs:       Latest Ref Rng & Units 05/16/2020   10:38 AM 07/31/2013    8:45 AM 07/21/2011    5:00 AM  CBC  WBC 4.0 - 10.5 K/uL 6.7  6.2  9.3   Hemoglobin 13.0 - 17.0 g/dL 87.0  86.5  89.8   Hematocrit 39.0 - 52.0 % 39.3  39.6  29.9   Platelets 150 - 400 K/uL 213  174  122         Latest Ref Rng & Units 07/16/2023   10:06 AM 05/21/2023   11:18 AM 06/30/2022    8:52 AM  BMP  Glucose 70 - 99 mg/dL  890  98   BUN 8 - 27 mg/dL  14  11   Creatinine 9.38 - 1.24 mg/dL 9.19  9.24  9.28   BUN/Creat Ratio 10 - 24  19  15    Sodium 134 - 144 mmol/L  136  135   Potassium 3.5 - 5.2 mmol/L  4.8  4.8   Chloride 96 - 106 mmol/L  101  100   CO2 20 - 29 mmol/L  23  20   Calcium  8.6 - 10.2 mg/dL  9.0  8.9       Latest Ref Rng & Units 07/16/2023   10:06 AM 05/21/2023   11:18 AM 06/30/2022    8:52 AM  CMP  Glucose 70 - 99 mg/dL  890  98   BUN 8 - 27 mg/dL  14  11   Creatinine 9.38 - 1.24 mg/dL 9.19  9.24  9.28   Sodium 134 - 144 mmol/L  136  135   Potassium 3.5 - 5.2 mmol/L  4.8  4.8   Chloride 96 - 106 mmol/L  101  100   CO2 20 - 29 mmol/L  23  20  Calcium  8.6 - 10.2 mg/dL  9.0  8.9     No results found for: CHOL, HDL, LDLCALC, LDLDIRECT, TRIG, CHOLHDL No results for input(s): LIPOA in the last 8760 hours. No components found for: NTPROBNP No results for input(s): PROBNP in the last 8760 hours. No results for input(s): TSH in the last 8760 hours.  Lipid profiles: February 2022 Lipid profile: Total cholesterol 103, triglycerides 94, HDL 34, non-HDL 51 September 2023: Total cholesterol 93, triglycerides 96, HDL 34, LDL calculated 40, non-HDL 59   Physical Exam:    Today's Vitals   02/28/24 1441  BP: 118/78  Pulse: 69  Resp: 16  SpO2: 93%  Weight: 245 lb 3.2 oz (111.2 kg)  Height: 6' (1.829 m)    Body mass index is 33.26 kg/m. Wt Readings from Last 3 Encounters:  02/28/24 245 lb 3.2 oz (111.2 kg)  06/25/23 286 lb 3.2 oz (129.8 kg)  12/22/22 259 lb (117.5 kg)    Physical Exam  Constitutional: No distress.  hemodynamically stable  Neck: No JVD present.  Cardiovascular: Normal rate, regular rhythm, S1 normal and S2 normal. Exam reveals no gallop, no S3 and no S4.  No murmur heard. Pulmonary/Chest: Effort normal and breath  sounds normal. No stridor. He has no wheezes. He has no rales.  Abdominal: Soft. Bowel sounds are normal. He exhibits no distension. There is no abdominal tenderness.  Musculoskeletal:        General: No edema.     Cervical back: Neck supple.  Neurological: He is alert and oriented to person, place, and time. He has intact cranial nerves (2-12).  Skin: Skin is warm.     Impression & Recommendation(s):  Impression:   ICD-10-CM   1. Ascending aorta dilation (HCC)  I77.810 EKG 12-Lead    ECHOCARDIOGRAM COMPLETE    2. Aortic root dilation (HCC)  I77.810 ECHOCARDIOGRAM COMPLETE    3. Shortness of breath  R06.02 ECHOCARDIOGRAM COMPLETE    4. Essential hypertension  I10     5. Coronary atherosclerosis due to calcified coronary lesion of native artery  I25.10    I25.84     6. Atherosclerosis of aorta (HCC)  I70.0     7. OSA on CPAP  G47.33     8. Class 1 obesity due to excess calories with serious comorbidity and body mass index (BMI) of 33.0 to 33.9 in adult  E66.811    E66.09    Z68.33        Recommendation(s):  Ascending aorta dilation [facility]) & aortic root dilatation Ascending aorta 46 mm, CT January 2025  Aortic root 40 mm, echo June 2023.   Scheduled for CT chest without contrast around July 20, 2024  Plan echocardiogram to evaluate aortic root and aortic root dilatation  Office and home blood pressures are well-controlled Continue Avapro  75 mg p.o. daily You should avoid use of Ciprofloxacin  and other associated antibiotics (flouroquinolone antibiotics ) It is  best to avoid activities that cause grunting or straining (medically referred to as a "valsalva maneuver"). This happens when a person bears down against a closed throat to increase the strength of arm or abdominal muscles. There's often a tendency to do this when lifting heavy weights, doing sit-ups, push-ups or chin-ups, etc., but it may be harmful.   Essential hypertension Office at home blood pressures are  well-controlled. Continue Avapro  75 mg p.o. daily Continue amlodipine  5 mg p.o. daily Spironolactone  has been discontinued since last office visit Has lost approximately 41 pounds with pharmacological  therapy as well as lifestyle changes Reemphasized importance of low-salt diet.  Coronary atherosclerosis due to calcified coronary lesion of native artery Atherosclerosis of aorta (HCC) Shortness of breath Denies anginal chest pain. His dyspnea on exertion remains relatively stable. Given his risk to reevaluate LVEF, regional wall motion abnormalities, and valvular heart disease.  This will also help evaluate the aortic root and ascending aortic dimensions Continue aspirin and statin therapy.  OSA on CPAP Endorses compliance w/ device therapy.  Class 1 severe obesity due to excess calories with serious comorbidity and body mass index (BMI) of 33.0 to 33.9 in adult [facility] Body mass index is 33.26 kg/m. I reviewed with him importance of diet, regular physical activity/exercise, weight loss.   Patient is educated on the importance of increasing physical activity gradually as tolerated with a goal of moderate intensity exercise for 30 minutes a day 5 days a week.   Orders Placed:  Orders Placed This Encounter  Procedures   EKG 12-Lead   ECHOCARDIOGRAM COMPLETE    Standing Status:   Future    Expected Date:   03/06/2024    Where [facility] this [facility] performed[facility]    Does the patient weigh less than or greater than 250 lbs?:   Patient weighs less than 250 lbs             245 lbs    Perflutren DEFINITY (image enhancing agent) should be administered unless hypersensitivity or allergy exist:   Administer Perflutren    Reason for exam-Echo:   Other-Full Diagnosis List    Full ICD-10/Reason for Exam:   Shortness of breath [786.05.ICD-9-CM]   Final Medication List:   No orders of the defined types were placed in this encounter.   Medications Discontinued During This  Encounter  Medication Reason   spironolactone  (ALDACTONE ) 25 MG tablet Patient Preference   Probiotic Product (ALIGN) 10 MG CAPS Patient Preference   pantoprazole  (PROTONIX ) 20 MG tablet Patient Preference     Current Outpatient Medications:    acetaminophen  (TYLENOL ) 500 MG tablet, Take 1,000 mg by mouth every 6 (six) hours as needed for moderate pain., Disp: , Rfl:    alfuzosin  (UROXATRAL ) 10 MG 24 hr tablet, Take 10 mg by mouth daily., Disp: , Rfl:    alfuzosin  (UROXATRAL ) 10 MG 24 hr tablet, Take 1 tablet (10 mg total) by mouth daily., Disp: 90 tablet, Rfl: 3   amLODipine  (NORVASC ) 5 MG tablet, Take 1 tablet by mouth daily., Disp: , Rfl:    Ascorbic Acid (VITAMIN C) 500 MG CAPS, See admin instructions., Disp: , Rfl:    aspirin EC 81 MG tablet, Take 81 mg by mouth daily., Disp: , Rfl:    azithromycin  (ZITHROMAX ) 500 MG tablet, Take 1 tablet (500 mg total) by mouth 2 (two) times daily for 2 days., Disp: 4 tablet, Rfl: 0   Cholecalciferol (VITAMIN D3) 125 MCG (5000 UT) TABS, Take 5,000 Units by mouth daily. , Disp: , Rfl:    clotrimazole-betamethasone (LOTRISONE) cream, Apply 1 application topically 2 (two) times daily., Disp: , Rfl:    Cyanocobalamin  (B-12) 1000 MCG TABS, 3,000 mcg., Disp: , Rfl:    docusate sodium  (COLACE) 100 MG capsule, Take 200 mg by mouth daily as needed for mild constipation., Disp: , Rfl:    donepezil  (ARICEPT ) 10 MG tablet, Take 1 tablet by mouth once daily at bedtime., Disp: 30 tablet, Rfl: 4   dutasteride  (AVODART ) 0.5 MG capsule, Take 1 capsule (0.5 mg  total) by mouth daily., Disp: 90 capsule, Rfl: 3   irbesartan  (AVAPRO ) 75 MG tablet, TAKE 1 TABLET BY MOUTH EVERY DAY, Disp: 90 tablet, Rfl: 3   loperamide  (IMODIUM ) 2 MG capsule, Take 1 capsule (2 mg total) by mouth every evening., Disp: 30 capsule, Rfl: 5   memantine  (NAMENDA ) 10 MG tablet, Take 1 tablet (10 mg total) by mouth 2 (two) times daily. Start after finishing 5 mg tablets., Disp: 60 tablet, Rfl: 11    Multiple Vitamin (MULTIVITAMIN) tablet, Take 1 tablet by mouth daily., Disp: , Rfl:    rosuvastatin  (CRESTOR ) 10 MG tablet, Take 10 mg by mouth daily., Disp: , Rfl:    Semaglutide , 2 MG/DOSE, (OZEMPIC , 2 MG/DOSE,) 8 MG/3ML SOPN, Inject 2 mg into the skin once a week., Disp: 3 mL, Rfl: 3   sertraline  (ZOLOFT ) 50 MG tablet, Take 1/2  tablet by mouth for 4 days then 1 tablet by mouh each morning, Disp: 90 tablet, Rfl: 4   traZODone  (DESYREL ) 50 MG tablet, Take 1 tablet (50 mg total) by mouth daily., Disp: 90 tablet, Rfl: 3   TURMERIC-GINGER PO, Take 1 tablet by mouth daily., Disp: , Rfl:    Zinc 50 MG TABS, Take 50 mg by mouth daily. , Disp: , Rfl:    Semaglutide , 2 MG/DOSE, (OZEMPIC , 2 MG/DOSE,) 8 MG/3ML SOPN, Inject 2 mg under the skin once weekly, Disp: 3 mL, Rfl: 3   Semaglutide , 2 MG/DOSE, (OZEMPIC , 2 MG/DOSE,) 8 MG/3ML SOPN, Inject 2 mg into the skin once weekly., Disp: 9 mL, Rfl: 3   Semaglutide , 2 MG/DOSE, (OZEMPIC , 2 MG/DOSE,) 8 MG/3ML SOPN, Inject 2 mg into the skin once a week., Disp: 3 mL, Rfl: 3   Semaglutide , 2 MG/DOSE, (OZEMPIC , 2 MG/DOSE,) 8 MG/3ML SOPN, Inject 2 mg into the skin once a week., Disp: 9 mL, Rfl: 3   Semaglutide , 2 MG/DOSE, (OZEMPIC , 2 MG/DOSE,) 8 MG/3ML SOPN, Inject 2 mg into the skin once a week., Disp: 3 mL, Rfl: 11  Consent:   None  Disposition:   February 2026, status post CT chest tentatively scheduled for July 20, 2024  His questions and concerns were addressed to his satisfaction. He voices understanding of the recommendations provided during this encounter.    Signed, Madonna Michele HAS, [facility]  A Division of [facility] [address] [place], [place] [address]

## 2024-03-01 ENCOUNTER — Other Ambulatory Visit (HOSPITAL_COMMUNITY): Payer: Self-pay

## 2024-03-12 ENCOUNTER — Encounter: Payer: Self-pay | Admitting: Cardiology

## 2024-03-13 DIAGNOSIS — Z125 Encounter for screening for malignant neoplasm of prostate: Secondary | ICD-10-CM | POA: Diagnosis not present

## 2024-03-13 DIAGNOSIS — I10 Essential (primary) hypertension: Secondary | ICD-10-CM | POA: Diagnosis not present

## 2024-03-13 DIAGNOSIS — E118 Type 2 diabetes mellitus with unspecified complications: Secondary | ICD-10-CM | POA: Diagnosis not present

## 2024-03-13 LAB — LAB REPORT - SCANNED
A1c: 5.5
Albumin, Urine POC: 84.3
Albumin/Creatinine Ratio, Urine, POC: 54
Creatinine, POC: 155.1 mg/dL
EGFR: 94

## 2024-03-20 ENCOUNTER — Other Ambulatory Visit (HOSPITAL_COMMUNITY): Payer: Self-pay

## 2024-03-20 DIAGNOSIS — I2510 Atherosclerotic heart disease of native coronary artery without angina pectoris: Secondary | ICD-10-CM | POA: Diagnosis not present

## 2024-03-20 DIAGNOSIS — E538 Deficiency of other specified B group vitamins: Secondary | ICD-10-CM | POA: Diagnosis not present

## 2024-03-20 DIAGNOSIS — Z23 Encounter for immunization: Secondary | ICD-10-CM | POA: Diagnosis not present

## 2024-03-20 DIAGNOSIS — I10 Essential (primary) hypertension: Secondary | ICD-10-CM | POA: Diagnosis not present

## 2024-03-20 DIAGNOSIS — G629 Polyneuropathy, unspecified: Secondary | ICD-10-CM | POA: Diagnosis not present

## 2024-03-20 DIAGNOSIS — G4733 Obstructive sleep apnea (adult) (pediatric): Secondary | ICD-10-CM | POA: Diagnosis not present

## 2024-03-20 DIAGNOSIS — F03B18 Unspecified dementia, moderate, with other behavioral disturbance: Secondary | ICD-10-CM | POA: Diagnosis not present

## 2024-03-20 DIAGNOSIS — E1169 Type 2 diabetes mellitus with other specified complication: Secondary | ICD-10-CM | POA: Diagnosis not present

## 2024-03-20 DIAGNOSIS — N401 Enlarged prostate with lower urinary tract symptoms: Secondary | ICD-10-CM | POA: Diagnosis not present

## 2024-03-20 DIAGNOSIS — E789 Disorder of lipoprotein metabolism, unspecified: Secondary | ICD-10-CM | POA: Diagnosis not present

## 2024-03-20 DIAGNOSIS — F03B0 Unspecified dementia, moderate, without behavioral disturbance, psychotic disturbance, mood disturbance, and anxiety: Secondary | ICD-10-CM | POA: Diagnosis not present

## 2024-03-20 DIAGNOSIS — K219 Gastro-esophageal reflux disease without esophagitis: Secondary | ICD-10-CM | POA: Diagnosis not present

## 2024-03-20 DIAGNOSIS — Z0000 Encounter for general adult medical examination without abnormal findings: Secondary | ICD-10-CM | POA: Diagnosis not present

## 2024-03-20 MED ORDER — REXULTI 0.5 MG PO TABS
0.5000 mg | ORAL_TABLET | Freq: Every day | ORAL | 11 refills | Status: AC
  Filled 2024-03-20 – 2024-03-27 (×3): qty 30, 30d supply, fill #0
  Filled 2024-05-03: qty 30, 30d supply, fill #1

## 2024-03-20 MED ORDER — AMOXICILLIN 500 MG PO CAPS
500.0000 mg | ORAL_CAPSULE | Freq: Three times a day (TID) | ORAL | 0 refills | Status: DC
  Filled 2024-03-20: qty 15, 5d supply, fill #0

## 2024-03-21 ENCOUNTER — Other Ambulatory Visit (HOSPITAL_COMMUNITY): Payer: Self-pay

## 2024-03-22 ENCOUNTER — Other Ambulatory Visit (HOSPITAL_COMMUNITY): Payer: Self-pay

## 2024-03-24 ENCOUNTER — Other Ambulatory Visit (HOSPITAL_COMMUNITY): Payer: Self-pay

## 2024-03-27 ENCOUNTER — Other Ambulatory Visit (HOSPITAL_COMMUNITY): Payer: Self-pay

## 2024-03-28 ENCOUNTER — Other Ambulatory Visit (HOSPITAL_COMMUNITY): Payer: Self-pay

## 2024-03-30 ENCOUNTER — Other Ambulatory Visit: Payer: Self-pay | Admitting: Student

## 2024-03-30 ENCOUNTER — Ambulatory Visit (HOSPITAL_COMMUNITY)
Admission: RE | Admit: 2024-03-30 | Discharge: 2024-03-30 | Disposition: A | Discharge status: Home / Self Care | Source: Ambulatory Visit | Attending: Cardiology | Admitting: Cardiology

## 2024-03-30 ENCOUNTER — Ambulatory Visit
Admission: RE | Admit: 2024-03-30 | Discharge: 2024-03-30 | Disposition: A | Discharge status: Home / Self Care | Source: Ambulatory Visit | Attending: Student

## 2024-03-30 DIAGNOSIS — K5900 Constipation, unspecified: Secondary | ICD-10-CM

## 2024-03-30 DIAGNOSIS — K529 Noninfective gastroenteritis and colitis, unspecified: Secondary | ICD-10-CM | POA: Diagnosis not present

## 2024-03-30 DIAGNOSIS — I77810 Thoracic aortic ectasia: Secondary | ICD-10-CM | POA: Diagnosis present

## 2024-03-30 DIAGNOSIS — R0602 Shortness of breath: Secondary | ICD-10-CM | POA: Diagnosis not present

## 2024-03-30 LAB — ECHOCARDIOGRAM COMPLETE
Area-P 1/2: 2.93 cm2
S' Lateral: 2.9 cm

## 2024-03-31 ENCOUNTER — Other Ambulatory Visit (HOSPITAL_COMMUNITY): Payer: Self-pay

## 2024-04-02 ENCOUNTER — Ambulatory Visit: Payer: Self-pay | Admitting: Cardiology

## 2024-04-04 ENCOUNTER — Other Ambulatory Visit (HOSPITAL_COMMUNITY): Payer: Self-pay

## 2024-04-04 DIAGNOSIS — K5900 Constipation, unspecified: Secondary | ICD-10-CM | POA: Diagnosis not present

## 2024-04-05 ENCOUNTER — Other Ambulatory Visit (HOSPITAL_COMMUNITY): Payer: Self-pay

## 2024-04-05 MED ORDER — DONEPEZIL HCL 10 MG PO TABS
10.0000 mg | ORAL_TABLET | Freq: Every day | ORAL | 11 refills | Status: AC
  Filled 2024-04-05: qty 30, 30d supply, fill #0
  Filled 2024-05-07: qty 30, 30d supply, fill #1
  Filled 2024-06-26: qty 30, 30d supply, fill #2

## 2024-04-25 DIAGNOSIS — F03B0 Unspecified dementia, moderate, without behavioral disturbance, psychotic disturbance, mood disturbance, and anxiety: Secondary | ICD-10-CM | POA: Diagnosis not present

## 2024-05-08 ENCOUNTER — Other Ambulatory Visit: Payer: Self-pay

## 2024-05-08 ENCOUNTER — Other Ambulatory Visit (HOSPITAL_COMMUNITY): Payer: Self-pay

## 2024-05-18 ENCOUNTER — Other Ambulatory Visit (HOSPITAL_COMMUNITY): Payer: Self-pay

## 2024-05-18 MED ORDER — REXULTI 0.5 MG PO TABS
1.0000 mg | ORAL_TABLET | Freq: Every day | ORAL | 3 refills | Status: DC
  Filled 2024-05-18: qty 180, 90d supply, fill #0

## 2024-05-18 MED ORDER — REXULTI 1 MG PO TABS
1.0000 mg | ORAL_TABLET | Freq: Every day | ORAL | 3 refills | Status: AC
  Filled 2024-05-18: qty 30, 30d supply, fill #0
  Filled 2024-06-15: qty 30, 30d supply, fill #1
  Filled 2024-07-28: qty 30, 30d supply, fill #2

## 2024-06-23 ENCOUNTER — Other Ambulatory Visit (HOSPITAL_COMMUNITY): Payer: Self-pay

## 2024-06-23 MED ORDER — ROSUVASTATIN CALCIUM 10 MG PO TABS
10.0000 mg | ORAL_TABLET | Freq: Every day | ORAL | 3 refills | Status: DC
  Filled 2024-06-23: qty 90, 90d supply, fill #0

## 2024-06-23 MED ORDER — IRBESARTAN 75 MG PO TABS
75.0000 mg | ORAL_TABLET | Freq: Every day | ORAL | 3 refills | Status: AC
  Filled 2024-06-23: qty 90, 90d supply, fill #0

## 2024-07-03 ENCOUNTER — Other Ambulatory Visit (HOSPITAL_COMMUNITY): Payer: Self-pay

## 2024-07-14 ENCOUNTER — Other Ambulatory Visit (HOSPITAL_COMMUNITY): Payer: Self-pay

## 2024-07-14 MED ORDER — CEPHALEXIN 500 MG PO CAPS
500.0000 mg | ORAL_CAPSULE | Freq: Four times a day (QID) | ORAL | 0 refills | Status: DC
  Filled 2024-07-14: qty 28, 7d supply, fill #0

## 2024-07-14 MED ORDER — PREDNISONE 20 MG PO TABS
20.0000 mg | ORAL_TABLET | Freq: Two times a day (BID) | ORAL | 0 refills | Status: DC
  Filled 2024-07-14: qty 6, 3d supply, fill #0

## 2024-07-17 ENCOUNTER — Emergency Department (HOSPITAL_COMMUNITY)

## 2024-07-17 ENCOUNTER — Emergency Department (HOSPITAL_COMMUNITY)
Admission: EM | Admit: 2024-07-17 | Discharge: 2024-07-17 | Disposition: A | Discharge status: Home / Self Care | Source: Home / Self Care | Attending: Emergency Medicine | Admitting: Emergency Medicine

## 2024-07-17 ENCOUNTER — Ambulatory Visit (HOSPITAL_COMMUNITY)

## 2024-07-17 ENCOUNTER — Encounter (HOSPITAL_COMMUNITY): Payer: Self-pay

## 2024-07-17 DIAGNOSIS — E119 Type 2 diabetes mellitus without complications: Secondary | ICD-10-CM | POA: Insufficient documentation

## 2024-07-17 DIAGNOSIS — G319 Degenerative disease of nervous system, unspecified: Secondary | ICD-10-CM | POA: Diagnosis not present

## 2024-07-17 DIAGNOSIS — F0390 Unspecified dementia without behavioral disturbance: Secondary | ICD-10-CM | POA: Insufficient documentation

## 2024-07-17 DIAGNOSIS — Z8673 Personal history of transient ischemic attack (TIA), and cerebral infarction without residual deficits: Secondary | ICD-10-CM | POA: Diagnosis not present

## 2024-07-17 DIAGNOSIS — I10 Essential (primary) hypertension: Secondary | ICD-10-CM | POA: Insufficient documentation

## 2024-07-17 DIAGNOSIS — R410 Disorientation, unspecified: Secondary | ICD-10-CM | POA: Insufficient documentation

## 2024-07-17 DIAGNOSIS — R531 Weakness: Secondary | ICD-10-CM | POA: Insufficient documentation

## 2024-07-17 LAB — PROTIME-INR
INR: 1.2 (ref 0.8–1.2)
Prothrombin Time: 16.3 s — ABNORMAL HIGH (ref 11.4–15.2)

## 2024-07-17 LAB — DIFFERENTIAL
Abs Immature Granulocytes: 0.06 K/uL (ref 0.00–0.07)
Basophils Absolute: 0.1 K/uL (ref 0.0–0.1)
Basophils Relative: 0 %
Eosinophils Absolute: 0.3 K/uL (ref 0.0–0.5)
Eosinophils Relative: 2 %
Immature Granulocytes: 1 %
Lymphocytes Relative: 13 %
Lymphs Abs: 1.4 K/uL (ref 0.7–4.0)
Monocytes Absolute: 1.5 K/uL — ABNORMAL HIGH (ref 0.1–1.0)
Monocytes Relative: 13 %
Neutro Abs: 7.9 K/uL — ABNORMAL HIGH (ref 1.7–7.7)
Neutrophils Relative %: 71 %

## 2024-07-17 LAB — COMPREHENSIVE METABOLIC PANEL WITH GFR
ALT: 10 U/L (ref 0–44)
AST: 22 U/L (ref 15–41)
Albumin: 4.1 g/dL (ref 3.5–5.0)
Alkaline Phosphatase: 79 U/L (ref 38–126)
Anion gap: 12 (ref 5–15)
BUN: 22 mg/dL (ref 8–23)
CO2: 26 mmol/L (ref 22–32)
Calcium: 9.4 mg/dL (ref 8.9–10.3)
Chloride: 102 mmol/L (ref 98–111)
Creatinine, Ser: 0.67 mg/dL (ref 0.61–1.24)
GFR, Estimated: >60 mL/min
Glucose, Bld: 131 mg/dL — ABNORMAL HIGH (ref 70–99)
Potassium: 3.6 mmol/L (ref 3.5–5.1)
Sodium: 139 mmol/L (ref 135–145)
Total Bilirubin: 1 mg/dL (ref 0.0–1.2)
Total Protein: 7.5 g/dL (ref 6.5–8.1)

## 2024-07-17 LAB — CBC
HCT: 41.8 % (ref 39.0–52.0)
Hemoglobin: 13.5 g/dL (ref 13.0–17.0)
MCH: 31.8 pg (ref 26.0–34.0)
MCHC: 32.3 g/dL (ref 30.0–36.0)
MCV: 98.6 fL (ref 80.0–100.0)
Platelets: 161 K/uL (ref 150–400)
RBC: 4.24 MIL/uL (ref 4.22–5.81)
RDW: 12 % (ref 11.5–15.5)
WBC: 11.2 K/uL — ABNORMAL HIGH (ref 4.0–10.5)
nRBC: 0 % (ref 0.0–0.2)

## 2024-07-17 LAB — APTT: aPTT: 34 s (ref 24–36)

## 2024-07-17 LAB — ETHANOL: Alcohol, Ethyl (B): <15 mg/dL

## 2024-07-17 NOTE — ED Notes (Signed)
 Called CCMD at [phone] to initiate cardiac monitoring as ordered.

## 2024-07-17 NOTE — ED Provider Notes (Signed)
 "  EMERGENCY DEPARTMENT AT [facility] Provider Note   CSN: [id number] Arrival date & time: 07/17/24  1349     Patient presents with: Weakness   [name] [name] is a 79 y.o. male.   Patient is here for evaluation of right sided weakness that began Friday evening.  Patient has a history of dementia, lives with his wife.  Wife provided history today.  Patient is pleasantly confused.  Patient also accompanied by his son and granddaughter.  Wife states beginning Friday evening patient had noticeable right-sided facial droop, right-sided upper extremity weakness, and right sided lower extremity weakness.  Denies serious falls or head injuries.  He has had a couple instances of sliding from the couch to the floor on his bottom but otherwise no falls.  He is not taking a blood thinner.  He has been eating and drinking well.  Denies recent fevers or illness.  Patient has not complained about urinary symptoms.  Wife notes the facial droop has improved with time.  They have gotten a new walker and a new chair to use while in the shower to help since these symptoms began.  Wife called their primary care provider who recommended they come here for further evaluation and to rule out acute stroke.  Last known normal Friday evening.  The history is provided by the spouse.  Weakness Severity:  Moderate Onset quality:  Sudden      Prior to Admission medications  Medication Sig Start Date End Date Taking? Authorizing Provider  acetaminophen  (TYLENOL ) 500 MG tablet Take 1,000 mg by mouth every 6 (six) hours as needed for moderate pain.    [provider]  alfuzosin  (UROXATRAL ) 10 MG 24 hr tablet Take 10 mg by mouth daily.    [provider]  alfuzosin  (UROXATRAL ) 10 MG 24 hr tablet Take 1 tablet (10 mg total) by mouth daily. 02/09/23     amLODipine  (NORVASC ) 5 MG tablet Take 1 tablet by mouth daily.    [provider]  amoxicillin  (AMOXIL ) 500 MG capsule Take 1  capsule (500 mg total) by mouth every 8 (eight) hours. 03/20/24     Ascorbic Acid (VITAMIN C) 500 MG CAPS See admin instructions.    [provider]  aspirin EC 81 MG tablet Take 81 mg by mouth daily.    [provider]  azithromycin  (ZITHROMAX ) 500 MG tablet Take 1 tablet (500 mg total) by mouth 2 (two) times daily for 2 days. 02/28/24     Brexpiprazole  (REXULTI ) 0.5 MG TABS Take 1 tablet (0.5 mg total) by mouth daily. 03/20/24     brexpiprazole  (REXULTI ) 1 MG TABS tablet Take 1 tablet (1 mg total) by mouth daily. 05/18/24     cephALEXin  (KEFLEX ) 500 MG capsule Take 1 capsule (500 mg total) by mouth 4 (four) times daily. 07/14/24     Cholecalciferol (VITAMIN D3) 125 MCG (5000 UT) TABS Take 5,000 Units by mouth daily.     [provider]  clotrimazole-betamethasone (LOTRISONE) cream Apply 1 application topically 2 (two) times daily. 11/21/20   [provider]  Cyanocobalamin  (B-12) 1000 MCG TABS 3,000 mcg. 04/02/22   [provider]  docusate sodium  (COLACE) 100 MG capsule Take 200 mg by mouth daily as needed for mild constipation.    [provider]  donepezil  (ARICEPT ) 10 MG tablet Take 1 tablet (10 mg total) by mouth at bedtime. 04/05/24     dutasteride  (AVODART ) 0.5 MG capsule Take 1 capsule (0.5 mg  total) by mouth daily. 07/13/23     irbesartan  (AVAPRO ) 75 MG tablet TAKE 1 TABLET BY MOUTH EVERY DAY 06/21/23   Tolia, Sunit, DO  irbesartan  (AVAPRO ) 75 MG tablet Take 1 tablet (75 mg total) by mouth daily. 06/23/24     loperamide  (IMODIUM ) 2 MG capsule Take 1 capsule (2 mg total) by mouth every evening. 11/04/23     memantine  (NAMENDA ) 10 MG tablet Take 1 tablet (10 mg total) by mouth 2 (two) times daily. Start after finishing 5 mg tablets. 06/28/23     Multiple Vitamin (MULTIVITAMIN) tablet Take 1 tablet by mouth daily.    [provider]  predniSONE  (DELTASONE ) 20 MG tablet Take 1 tablet (20 mg total) by mouth 2 (two) times daily for 3 days  07/14/24     rosuvastatin  (CRESTOR ) 10 MG tablet Take 10 mg by mouth daily. 09/20/19   [provider]  rosuvastatin  (CRESTOR ) 10 MG tablet TAKE 1 TABLET BY MOUTH EVERY DAY 06/23/24     Semaglutide , 2 MG/DOSE, (OZEMPIC , 2 MG/DOSE,) 8 MG/3ML SOPN Inject 2 mg under the skin once weekly 08/28/21     Semaglutide , 2 MG/DOSE, (OZEMPIC , 2 MG/DOSE,) 8 MG/3ML SOPN Inject 2 mg into the skin once weekly. 09/17/22     Semaglutide , 2 MG/DOSE, (OZEMPIC , 2 MG/DOSE,) 8 MG/3ML SOPN Inject 2 mg into the skin once a week. 03/02/23     Semaglutide , 2 MG/DOSE, (OZEMPIC , 2 MG/DOSE,) 8 MG/3ML SOPN Inject 2 mg into the skin once a week. 03/30/23     Semaglutide , 2 MG/DOSE, (OZEMPIC , 2 MG/DOSE,) 8 MG/3ML SOPN Inject 2 mg into the skin once a week. 06/15/23     Semaglutide , 2 MG/DOSE, (OZEMPIC , 2 MG/DOSE,) 8 MG/3ML SOPN Inject 2 mg into the skin once a week. 09/14/23     sertraline  (ZOLOFT ) 50 MG tablet Take 1/2  tablet by mouth for 4 days then 1 tablet by mouh each morning 12/21/23     traZODone  (DESYREL ) 50 MG tablet Take 1 tablet (50 mg total) by mouth daily. 04/08/23     TURMERIC-GINGER PO Take 1 tablet by mouth daily.    [provider]  Zinc 50 MG TABS Take 50 mg by mouth daily.     [provider]  zolpidem  (AMBIEN  CR) 6.25 MG CR tablet Take 1 tablet (6.25 mg total) by mouth at bedtime as needed. 12/21/23 01/25/24      Allergies: Patient has no known allergies.    Review of Systems  Neurological:  Positive for weakness.    Updated Vital Signs BP 128/78 (BP Location: Right Arm)   Pulse 83   Temp 97.9 F (36.6 C) (Oral)   Resp 14   SpO2 93%   Physical Exam Vitals and nursing note reviewed.  Constitutional:      General: He is not in acute distress.    Appearance: Normal appearance. He is not ill-appearing, toxic-appearing or diaphoretic.     Comments: Pleasantly confused.  HENT:     Head: Normocephalic and atraumatic.     Comments: Very slight right-sided facial droop.    Nose: Nose  normal.     Mouth/Throat:     Mouth: Mucous membranes are moist.  Eyes:     General: No scleral icterus.    Extraocular Movements: Extraocular movements intact.     Conjunctiva/sclera: Conjunctivae normal.     Pupils: Pupils are equal, round, and reactive to light.  Cardiovascular:     Rate and Rhythm: Normal rate and regular rhythm.  Pulses: Normal pulses.     Heart sounds: Normal heart sounds.  Pulmonary:     Effort: Pulmonary effort is normal. No respiratory distress.     Breath sounds: Normal breath sounds. No stridor. No wheezing, rhonchi or rales.  Abdominal:     General: Abdomen is flat. There is no distension.     Palpations: Abdomen is soft.     Tenderness: There is no abdominal tenderness. There is no guarding.  Musculoskeletal:        General: No tenderness or deformity. Normal range of motion.     Cervical back: Normal range of motion.     Right lower leg: No edema.     Left lower leg: No edema.  Skin:    General: Skin is warm and dry.     Capillary Refill: Capillary refill takes less than 2 seconds.     Coloration: Skin is not jaundiced or pale.  Neurological:     Mental Status: He is alert. Mental status is at baseline.     Comments: Unable to complete neurological exam as the patient is unable to follow directions.  Mentating at baseline per family.     (all labs ordered are listed, but only abnormal results are displayed) Labs Reviewed  PROTIME-INR - Abnormal; Notable for the following components:      Result Value   Prothrombin Time 16.3 (*)    All other components within normal limits  CBC - Abnormal; Notable for the following components:   WBC 11.2 (*)    All other components within normal limits  DIFFERENTIAL - Abnormal; Notable for the following components:   Neutro Abs 7.9 (*)    Monocytes Absolute 1.5 (*)    All other components within normal limits  COMPREHENSIVE METABOLIC PANEL WITH GFR - Abnormal; Notable for the following components:    Glucose, Bld 131 (*)    All other components within normal limits  APTT  ETHANOL  URINE DRUG SCREEN    EKG: EKG Interpretation Date/Time:  Monday July 17 2024 13:57:56 EST Ventricular Rate:  80 PR Interval:    QRS Duration:  109 QT Interval:  395 QTC Calculation: 456 R Axis:   -31  Text Interpretation: Sinus rhythm Abnormal R-wave progression, early transition Confirmed by Cottie Cough [phone]) on 07/17/2024 8:42:58 PM  Radiology: CT HEAD WO CONTRAST Result Date: 07/17/2024 CLINICAL DATA:  Dementia, right-sided weakness EXAM: CT HEAD WITHOUT CONTRAST TECHNIQUE: Contiguous axial images were obtained from the base of the skull through the vertex without intravenous contrast. RADIATION DOSE REDUCTION: This exam was performed according to the departmental dose-optimization program which includes automated exposure control, adjustment of the mA and/or kV according to patient size and/or use of iterative reconstruction technique. COMPARISON:  MRI 04/01/2022 FINDINGS: CT HEAD: There is cerebral atrophy. Old lacunar infarct in the head of the caudate nucleus on the right. There is no hemorrhage. No acute ischemic changes. No mass lesion. The ventricles are normal. Skull/sinuses/orbits: No significant abnormality. IMPRESSION: Cerebral atrophy. Old lacunar infarct in the right caudate nucleus. No acute abnormality. Electronically Signed   By: Nancyann Burns M.D.   On: 07/17/2024 15:53   MR BRAIN WO CONTRAST Result Date: 07/17/2024 CLINICAL DATA:  Neuro deficit, acute stroke suspected EXAM: MRI HEAD WITHOUT CONTRAST TECHNIQUE: Multiplanar, multiecho pulse sequences of the brain and surrounding structures were obtained without intravenous contrast. COMPARISON:  April 01, 2022 FINDINGS: MRI brain: There is a small old infarct in the medial left occipital lobe with encephalomalacia. Old  lacunar infarct in the right caudate nucleus. Old lacunar infarct in the right-side of the cerebellum. No acute  infarct. The ventricles are normal. No mass lesion. There are normal flow signals in the carotid arteries and basilar artery. No significant bone marrow signal abnormality. No significant abnormality in the paranasal sinuses or soft tissues. IMPRESSION: 1. 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 Old lacunar infarcts in the right cerebellum and right caudate nucleus 3. No acute infarct 4. No change from 04/01/2022 Electronically Signed   By: Nancyann Burns M.D.   On: 07/17/2024 15:33    {Document cardiac monitor, telemetry assessment procedure when appropriate:32947} Procedures   Medications Ordered in the ED - No data to display   Patient presents to the ED for concern of ***, this involves an extensive number of treatment options, and is a complaint that carries with it a high risk of complications and morbidity.  The differential diagnosis includes ***   Co morbidities that complicate the patient evaluation  Dementia ***   Additional history obtained:  Additional history obtained from *** {Blank multiple:19196::EMS,Family,Nursing,Outside Medical Records,Past Admission}   External records from outside source obtained and reviewed including ***   Lab Tests:  I Ordered, and personally interpreted labs.  The pertinent results include: Blood work is largely unremarkable.   Imaging Studies ordered:  I ordered imaging studies including,  Head CT: No acute abnormality.  Cerebral atrophy.  Old lacunar infarct in the right caudate nucleus. Brain MRI: No acute infarct.  Old infarct in the medial left occipital lobe.  Old lacunar infarcts in the right cerebellum and right caudate nucleus.  No change from 04/01/2022.   Cardiac Monitoring:  The patient was maintained on a cardiac monitor.  I personally viewed and interpreted the cardiac monitored which showed an underlying rhythm of: ***   Medicines ordered and prescription drug management:  I ordered medication  including ***  for ***  Reevaluation of the patient after these medicines showed that the patient {resolved/improved/worsened:23923::improved} I have reviewed the patients home medicines and have made adjustments as needed   Test Considered:  ***   Critical Interventions:  ***   Consultations Obtained:  I requested consultation with the ***,  and discussed lab and imaging findings as well as pertinent plan - they recommend: ***   Problem List / ED Course:     ***   Reevaluation:  After the interventions noted above, I reevaluated the patient and found that they have :{resolved/improved/worsened:23923::improved}   Social Determinants of Health:  ***   Dispostion:  After consideration of the diagnostic results and the patients response to treatment, I feel that the patent would benefit from ***.     {Click here for ABCD2, HEART and other calculators REFRESH Note before signing:1}                              Medical Decision Making  This note was produced using Dragon Medical voice recognition. While the provider has reviewed and verified all clinical information, transcription errors may remain.    Final diagnoses:  None    ED Discharge Orders     None        "

## 2024-07-17 NOTE — ED Notes (Signed)
 Pt O2 saturation noted to be 88-89% on room air while sleeping. Placed pt on 2L [place] with improvement to 99%.

## 2024-07-17 NOTE — ED Triage Notes (Signed)
 Pt arrived via POV with family. States right sided weakness since Friday night. Hx of dementia. Pt right sided facial droop. Right grip strength lessened. Per family multiple witnessed falls.

## 2024-07-17 NOTE — Discharge Instructions (Addendum)
 It was a pleasure meeting with you today.  As we discussed there were no acute findings on imaging or lab work today.  Follow-up with primary care and established neurology office for ongoing evaluation and management.  Please return to ED if symptoms worsen or any new or concerning symptoms develop.

## 2024-07-17 NOTE — ED Provider Triage Note (Signed)
 Emergency Medicine Provider Triage Evaluation Note  [name] [name] , a 79 y.o. male  was evaluated in triage.  Pt complains of right sided weakness.  By his wife who reports that she noted that symptoms started Friday evening and have progressively worsened.  He usually can walk with a walker but having trouble doing that due to the weakness in his leg.  Also noticed some right sided facial droop.  Has dementia.  Review of Systems  Positive: See above Negative: See above  Physical Exam  BP (!) 133/93 (BP Location: Right Arm)   Pulse 80   Temp 97.6 F (36.4 C) (Oral)   Resp 18   SpO2 98%  Gen:   Awake, no distress   Resp:  Normal effort  MSK:   Moves extremities without difficulty  Other:  Right arm is weak, right side facial droop noted.  Medical Decision Making  Medically screening exam initiated at 2:07 PM.  Appropriate orders placed.  CHIDIEBERE WYNN was informed that the remainder of the evaluation will be completed by another provider, this initial triage assessment does not replace that evaluation, and the importance of remaining in the ED until their evaluation is complete.  See above   Lang Norleen POUR, PA-C 07/17/24 1408

## 2024-07-18 ENCOUNTER — Emergency Department (HOSPITAL_COMMUNITY)

## 2024-07-18 ENCOUNTER — Telehealth (HOSPITAL_COMMUNITY): Payer: Self-pay | Admitting: Cardiology

## 2024-07-18 ENCOUNTER — Inpatient Hospital Stay (HOSPITAL_COMMUNITY)
Admission: EM | Admit: 2024-07-18 | Discharge: 2024-07-27 | DRG: 175 | Disposition: A | Discharge status: Skilled Nursing Facility | Attending: Internal Medicine | Admitting: Internal Medicine

## 2024-07-18 ENCOUNTER — Encounter (HOSPITAL_COMMUNITY): Payer: Self-pay | Admitting: Pharmacy Technician

## 2024-07-18 ENCOUNTER — Other Ambulatory Visit: Payer: Self-pay

## 2024-07-18 DIAGNOSIS — I10 Essential (primary) hypertension: Secondary | ICD-10-CM | POA: Diagnosis present

## 2024-07-18 DIAGNOSIS — Z833 Family history of diabetes mellitus: Secondary | ICD-10-CM

## 2024-07-18 DIAGNOSIS — Z79899 Other long term (current) drug therapy: Secondary | ICD-10-CM | POA: Diagnosis not present

## 2024-07-18 DIAGNOSIS — R262 Difficulty in walking, not elsewhere classified: Secondary | ICD-10-CM | POA: Diagnosis present

## 2024-07-18 DIAGNOSIS — Z7985 Long-term (current) use of injectable non-insulin antidiabetic drugs: Secondary | ICD-10-CM | POA: Diagnosis not present

## 2024-07-18 DIAGNOSIS — I82461 Acute embolism and thrombosis of right calf muscular vein: Secondary | ICD-10-CM | POA: Diagnosis present

## 2024-07-18 DIAGNOSIS — Z66 Do not resuscitate: Secondary | ICD-10-CM | POA: Diagnosis present

## 2024-07-18 DIAGNOSIS — G4730 Sleep apnea, unspecified: Secondary | ICD-10-CM | POA: Diagnosis present

## 2024-07-18 DIAGNOSIS — F0390 Unspecified dementia without behavioral disturbance: Secondary | ICD-10-CM | POA: Diagnosis present

## 2024-07-18 DIAGNOSIS — Z86711 Personal history of pulmonary embolism: Secondary | ICD-10-CM | POA: Diagnosis not present

## 2024-07-18 DIAGNOSIS — I82451 Acute embolism and thrombosis of right peroneal vein: Secondary | ICD-10-CM | POA: Diagnosis present

## 2024-07-18 DIAGNOSIS — I2699 Other pulmonary embolism without acute cor pulmonale: Principal | ICD-10-CM | POA: Diagnosis present

## 2024-07-18 DIAGNOSIS — R29810 Facial weakness: Secondary | ICD-10-CM | POA: Diagnosis present

## 2024-07-18 DIAGNOSIS — E785 Hyperlipidemia, unspecified: Secondary | ICD-10-CM | POA: Diagnosis present

## 2024-07-18 DIAGNOSIS — K219 Gastro-esophageal reflux disease without esophagitis: Secondary | ICD-10-CM | POA: Diagnosis present

## 2024-07-18 DIAGNOSIS — I2690 Septic pulmonary embolism without acute cor pulmonale: Secondary | ICD-10-CM | POA: Diagnosis not present

## 2024-07-18 DIAGNOSIS — Z751 Person awaiting admission to adequate facility elsewhere: Secondary | ICD-10-CM

## 2024-07-18 DIAGNOSIS — E876 Hypokalemia: Secondary | ICD-10-CM | POA: Diagnosis not present

## 2024-07-18 DIAGNOSIS — I272 Pulmonary hypertension, unspecified: Secondary | ICD-10-CM | POA: Diagnosis present

## 2024-07-18 DIAGNOSIS — Z7982 Long term (current) use of aspirin: Secondary | ICD-10-CM | POA: Diagnosis not present

## 2024-07-18 DIAGNOSIS — K6289 Other specified diseases of anus and rectum: Secondary | ICD-10-CM | POA: Diagnosis present

## 2024-07-18 DIAGNOSIS — Z87891 Personal history of nicotine dependence: Secondary | ICD-10-CM | POA: Diagnosis not present

## 2024-07-18 DIAGNOSIS — I2694 Multiple subsegmental pulmonary emboli without acute cor pulmonale: Secondary | ICD-10-CM | POA: Diagnosis not present

## 2024-07-18 DIAGNOSIS — J9601 Acute respiratory failure with hypoxia: Secondary | ICD-10-CM | POA: Diagnosis present

## 2024-07-18 DIAGNOSIS — Z9884 Bariatric surgery status: Secondary | ICD-10-CM

## 2024-07-18 DIAGNOSIS — E119 Type 2 diabetes mellitus without complications: Secondary | ICD-10-CM | POA: Diagnosis present

## 2024-07-18 LAB — COMPREHENSIVE METABOLIC PANEL WITH GFR
ALT: 12 U/L (ref 0–44)
AST: 18 U/L (ref 15–41)
Albumin: 3.9 g/dL (ref 3.5–5.0)
Alkaline Phosphatase: 75 U/L (ref 38–126)
Anion gap: 11 (ref 5–15)
BUN: 19 mg/dL (ref 8–23)
CO2: 27 mmol/L (ref 22–32)
Calcium: 9.2 mg/dL (ref 8.9–10.3)
Chloride: 103 mmol/L (ref 98–111)
Creatinine, Ser: 0.66 mg/dL (ref 0.61–1.24)
GFR, Estimated: >60 mL/min
Glucose, Bld: 159 mg/dL — ABNORMAL HIGH (ref 70–99)
Potassium: 3.7 mmol/L (ref 3.5–5.1)
Sodium: 141 mmol/L (ref 135–145)
Total Bilirubin: 0.8 mg/dL (ref 0.0–1.2)
Total Protein: 7 g/dL (ref 6.5–8.1)

## 2024-07-18 LAB — CBC WITH DIFFERENTIAL/PLATELET
Abs Immature Granulocytes: 0.05 K/uL (ref 0.00–0.07)
Basophils Absolute: 0 K/uL (ref 0.0–0.1)
Basophils Relative: 0 %
Eosinophils Absolute: 0 K/uL (ref 0.0–0.5)
Eosinophils Relative: 0 %
HCT: 38.9 % — ABNORMAL LOW (ref 39.0–52.0)
Hemoglobin: 12.8 g/dL — ABNORMAL LOW (ref 13.0–17.0)
Immature Granulocytes: 0 %
Lymphocytes Relative: 7 %
Lymphs Abs: 0.9 K/uL (ref 0.7–4.0)
MCH: 32.3 pg (ref 26.0–34.0)
MCHC: 32.9 g/dL (ref 30.0–36.0)
MCV: 98.2 fL (ref 80.0–100.0)
Monocytes Absolute: 1.8 K/uL — ABNORMAL HIGH (ref 0.1–1.0)
Monocytes Relative: 14 %
Neutro Abs: 9.8 K/uL — ABNORMAL HIGH (ref 1.7–7.7)
Neutrophils Relative %: 79 %
Platelets: 148 K/uL — ABNORMAL LOW (ref 150–400)
RBC: 3.96 MIL/uL — ABNORMAL LOW (ref 4.22–5.81)
RDW: 12 % (ref 11.5–15.5)
WBC: 12.5 K/uL — ABNORMAL HIGH (ref 4.0–10.5)
nRBC: 0 % (ref 0.0–0.2)

## 2024-07-18 LAB — RESP PANEL BY RT-PCR (RSV, FLU A&B, COVID)  RVPGX2
Influenza A by PCR: NEGATIVE
Influenza B by PCR: NEGATIVE
Resp Syncytial Virus by PCR: NEGATIVE
SARS Coronavirus 2 by RT PCR: NEGATIVE

## 2024-07-18 LAB — I-STAT CG4 LACTIC ACID, ED: Lactic Acid, Venous: 1.5 mmol/L (ref 0.5–1.9)

## 2024-07-18 LAB — PROTIME-INR
INR: 1.3 — ABNORMAL HIGH (ref 0.8–1.2)
Prothrombin Time: 16.9 s — ABNORMAL HIGH (ref 11.4–15.2)

## 2024-07-18 MED ORDER — ACETAMINOPHEN 325 MG PO TABS
650.0000 mg | ORAL_TABLET | Freq: Four times a day (QID) | ORAL | Status: DC | PRN
  Administered 2024-07-20 – 2024-07-23 (×2): 650 mg via ORAL
  Filled 2024-07-18 (×2): qty 2

## 2024-07-18 MED ORDER — ONDANSETRON HCL 4 MG/2ML IJ SOLN: 4.0000 mg | Freq: Four times a day (QID) | INTRAMUSCULAR | Status: DC | PRN

## 2024-07-18 MED ORDER — ONDANSETRON HCL 4 MG PO TABS: 4.0000 mg | ORAL_TABLET | Freq: Four times a day (QID) | ORAL | Status: DC | PRN

## 2024-07-18 MED ORDER — ACETAMINOPHEN 650 MG RE SUPP: 650.0000 mg | Freq: Four times a day (QID) | RECTAL | Status: DC | PRN

## 2024-07-18 MED ORDER — AZITHROMYCIN 250 MG PO TABS
500.0000 mg | ORAL_TABLET | Freq: Every day | ORAL | Status: DC
  Filled 2024-07-18: qty 2

## 2024-07-18 MED ORDER — SODIUM CHLORIDE 0.9% FLUSH
3.0000 mL | Freq: Two times a day (BID) | INTRAVENOUS | Status: DC
  Administered 2024-07-18 – 2024-07-25 (×14): 3 mL via INTRAVENOUS

## 2024-07-18 MED ORDER — HEPARIN (PORCINE) 25000 UT/250ML-% IV SOLN
2200.0000 [IU]/h | INTRAVENOUS | Status: DC
  Administered 2024-07-18: 1600 [IU]/h via INTRAVENOUS
  Administered 2024-07-20: 1900 [IU]/h via INTRAVENOUS
  Administered 2024-07-20: 2000 [IU]/h via INTRAVENOUS
  Administered 2024-07-21: 2200 [IU]/h via INTRAVENOUS
  Filled 2024-07-18 (×5): qty 250

## 2024-07-18 MED ORDER — SODIUM CHLORIDE 0.9 % IV SOLN
1.0000 g | Freq: Once | INTRAVENOUS | Status: AC
  Administered 2024-07-18: 1 g via INTRAVENOUS
  Filled 2024-07-18: qty 10

## 2024-07-18 MED ORDER — ONDANSETRON HCL 4 MG/2ML IJ SOLN
4.0000 mg | Freq: Once | INTRAMUSCULAR | Status: AC
  Administered 2024-07-18: 4 mg via INTRAVENOUS
  Filled 2024-07-18: qty 2

## 2024-07-18 MED ORDER — BISACODYL 5 MG PO TBEC: 5.0000 mg | DELAYED_RELEASE_TABLET | Freq: Every day | ORAL | Status: DC | PRN

## 2024-07-18 MED ORDER — SODIUM CHLORIDE 0.9 % IV SOLN: 100.0000 mg | Freq: Two times a day (BID) | INTRAVENOUS | Status: DC

## 2024-07-18 MED ORDER — IOHEXOL 300 MG/ML  SOLN
100.0000 mL | Freq: Once | INTRAMUSCULAR | Status: AC | PRN
  Administered 2024-07-18: 100 mL via INTRAVENOUS

## 2024-07-18 MED ORDER — LACTATED RINGERS IV SOLN: INTRAVENOUS | Status: DC

## 2024-07-18 MED ORDER — LACTATED RINGERS IV BOLUS (SEPSIS)
1000.0000 mL | Freq: Once | INTRAVENOUS | Status: AC
  Administered 2024-07-18: 1000 mL via INTRAVENOUS

## 2024-07-18 MED ORDER — HEPARIN BOLUS VIA INFUSION
3000.0000 [IU] | Freq: Once | INTRAVENOUS | Status: AC
  Administered 2024-07-18: 3000 [IU] via INTRAVENOUS
  Filled 2024-07-18: qty 3000

## 2024-07-18 NOTE — Sepsis Progress Note (Signed)
 Elink will follow per sepsis protocol.

## 2024-07-18 NOTE — ED Triage Notes (Signed)
 Pt bib ems from home where he lives with family with reports of abnormal labs, elevated WBC. Hx bladder infection. Pt with ongoing weakness. Seen here yesterday weakness. Given 500cc LR en route. Confusion at baseline due to dementia.  HR 90-100 150 systolic 101F End tidal 20-27 RR 23 CBG 180

## 2024-07-18 NOTE — Telephone Encounter (Signed)
 Pt son asking for a call at [phone] to discuss patient symptoms with provider.

## 2024-07-18 NOTE — Progress Notes (Signed)
 PHARMACY - ANTICOAGULATION CONSULT NOTE  Pharmacy Consult for Heparin  Indication: pulmonary embolus  Allergies[1]  Patient Measurements:    Vital Signs: Temp: 98 F (36.7 C) (01/13 2100) Temp Source: Oral (01/13 2100) BP: 158/85 (01/13 2115) Pulse Rate: 93 (01/13 2115)  Labs: Recent Labs    07/17/24 1438 07/18/24 1829  HGB 13.5 12.8*  HCT 41.8 38.9*  PLT 161 148*  APTT 34  --   LABPROT 16.3* 16.9*  INR 1.2 1.3*  CREATININE 0.67 0.66    CrCl cannot be calculated (Unknown ideal weight.).   Medical History: Past Medical History:  Diagnosis Date   Aneurysm, ascending aorta    Arthritis    Coronary artery disease    pt denies   Diabetes (HCC)    type 2   Difficulty urinating    prostate problem   GERD (gastroesophageal reflux disease)    HIATAL HERNIA REPAIRED -with lap band 2 years ago NO LONGER HAS GERD   History of elevated glucose    IN THE PAST - NO PROBLEMS SINCE GASTRIC BANDING WEIGHT LOSS    History of kidney stones    Hyperlipidemia    Hypertension    resolved with lap band 2 years ago   Memory loss    Pneumonia    Pulmonary hypertension (HCC)    per echo report  pt unaware   Sleep apnea    uses cpap setting is 12    Medications:  Infusions:   lactated ringers  150 mL/hr at 07/18/24 1806    Assessment: 79 yo M with new PE per chest CT without evidence of RHS. Not on anticoagulation prior to admission.  Pharmacy consulted to dose IV heparin . Baseline labs: Hg/pltc WNL on arrival, Scr WNL.  Goal of Therapy:  Heparin  level 0.3-0.7 units/ml Monitor platelets by anticoagulation protocol: Yes   Plan:  Give 3000 units bolus x 1 Start heparin  infusion at 1600 units/hr Check anti-Xa level in 8 hours and daily while on heparin  Continue to monitor H&H and platelets  Rosaline Millet PharmD 07/18/2024,9:30 PM      [1] No Known Allergies

## 2024-07-18 NOTE — ED Notes (Signed)
 First poc with patient. PT returned from imaging.  Reconnected to continuous cardiac, pulse ox and bp monitoring.  PT vomited x1 prior to leaving for CT.  Notified MD.  Orders placed.  PT AxOXself, baseline per family at bedside. No respiratory distress noted.  Zithro order held at this time d/t vomiting episode, will reassess.  Family at bedside.

## 2024-07-18 NOTE — H&P (Signed)
 " History and Physical    Patient: [name] [name] FMW:[id number] DOB: [birth year] DOA: 07/18/2024 DOS: the patient was seen and examined on 07/18/2024 PCP: Clarice Nottingham, MD  Patient coming from: home  Chief Complaint:  Chief Complaint  Patient presents with   Weakness   HPI: [name] [name] is a 79 y.o. male with medical history significant for advanced dementia, type 2 diabetes mellitus, and hypertension who has had severe weakness for the last 3 days.  The patient has dementia but walks with a walker at baseline.  His wife is his primary caregiver.  He has been very weak and had a lot of trouble walking over the last couple of days.  He was leaning to the right yesterday and family actually brought him into the emergency department for evaluation for stroke.  They spent the entire day pretty much in the emergency department but eventually his MRI was negative for acute stroke and the patient was discharged.  Today the patient was not able to get up out of bed and reportedly he had a temp of 101 so he was brought back in for evaluation. This time the patient was evaluated for possible infection.  His white blood cell count was elevated at 12.5 which is higher than where it was yesterday at 11.2.  His Tmax in the emergency department was 99.3.  His lactic acid was 1.5.  He was COVID, flu, and RSV negative.  A CT scan of his chest abdomen and pelvis was done.  That revealed a pulmonary embolism in the right lower lobe.  No pneumonia was seen on CT.  Urinalysis had not been collected by the time of this dictation. The patient required 2 L O2 nasal cannula to keep his sats in the 90s so he will be admitted to the hospital.   Review of Systems: As mentioned in the history of present illness. All other systems reviewed and are negative. Past Medical History:  Diagnosis Date   Aneurysm, ascending aorta    Arthritis    Coronary artery disease    pt denies   Diabetes (HCC)    type 2   Difficulty  urinating    prostate problem   GERD (gastroesophageal reflux disease)    HIATAL HERNIA REPAIRED -with lap band 2 years ago NO LONGER HAS GERD   History of elevated glucose    IN THE PAST - NO PROBLEMS SINCE GASTRIC BANDING WEIGHT LOSS    History of kidney stones    Hyperlipidemia    Hypertension    resolved with lap band 2 years ago   Memory loss    Pneumonia    Pulmonary hypertension (HCC)    per echo report  pt unaware   Sleep apnea    uses cpap setting is 12   Past Surgical History:  Procedure Laterality Date   COLONOSCOPY  07/17/11   CYSTOSCOPY W/ URETERAL STENT PLACEMENT  07/18/2011   Procedure: CYSTOSCOPY WITH STENT REPLACEMENT;  Surgeon: Ricardo Likens;  Location: [facility];  Service: Urology;  Laterality: Left;   GASTRIC RESTRICTION SURGERY  12/11/2008   lap band   HEMORRHOID SURGERY N/A 08/04/2013   Procedure: EUA,HEMORRHOIDECTOMY;  Surgeon: Donnice KATHEE Lunger, MD;  Location: [facility];  Service: General;  Laterality: N/A;   LAPAROSCOPIC GASTRIC BANDING  12/11/2008   LITHOTRIPSY     Social History:  reports that he quit smoking about 53 years ago. His smoking use included cigarettes. He started smoking about 61 years ago. He has  a 16 pack-year smoking history. He has quit using smokeless tobacco.  His smokeless tobacco use included snuff. He reports current alcohol use. He reports that he does not use drugs.  Allergies[1]  Family History  Problem Relation Age of Onset   Cancer Mother        ovarian   Diabetes Mother    Dementia Mother    Other Father        MVA   Healthy Brother     Prior to Admission medications  Medication Sig Start Date End Date Taking? Authorizing Provider  acetaminophen  (TYLENOL ) 500 MG tablet Take 1,000 mg by mouth every 6 (six) hours as needed for moderate pain.   Yes [provider]  aspirin EC 81 MG tablet Take 81 mg by mouth daily.   Yes [provider]  brexpiprazole  (REXULTI ) 1 MG TABS tablet Take 1 tablet (1 mg total) by mouth  daily. 05/18/24  Yes   brimonidine  (ALPHAGAN ) 0.2 % ophthalmic solution 3 (three) times daily. 07/02/24  Yes [provider]  docusate sodium  (COLACE) 100 MG capsule Take 200 mg by mouth daily as needed for mild constipation.   Yes [provider]  donepezil  (ARICEPT ) 10 MG tablet Take 1 tablet (10 mg total) by mouth at bedtime. 04/05/24  Yes   dutasteride  (AVODART ) 0.5 MG capsule Take 1 capsule (0.5 mg total) by mouth daily. 07/13/23  Yes   irbesartan  (AVAPRO ) 75 MG tablet Take 1 tablet (75 mg total) by mouth daily. 06/23/24  Yes   memantine  (NAMENDA ) 10 MG tablet Take 1 tablet (10 mg total) by mouth 2 (two) times daily. Start after finishing 5 mg tablets. 06/28/23  Yes   Multiple Vitamin (MULTIVITAMIN) tablet Take 1 tablet by mouth daily.   Yes [provider]  rosuvastatin  (CRESTOR ) 10 MG tablet TAKE 1 TABLET BY MOUTH EVERY DAY 06/23/24  Yes   Semaglutide , 2 MG/DOSE, (OZEMPIC , 2 MG/DOSE,) 8 MG/3ML SOPN Inject 2 mg under the skin once weekly 08/28/21  Yes   sertraline  (ZOLOFT ) 50 MG tablet Take 1/2  tablet by mouth for 4 days then 1 tablet by mouh each morning 12/21/23  Yes   alfuzosin  (UROXATRAL ) 10 MG 24 hr tablet Take 10 mg by mouth daily. Patient not taking: Reported on 07/18/2024    [provider]  alfuzosin  (UROXATRAL ) 10 MG 24 hr tablet Take 1 tablet (10 mg total) by mouth daily. Patient not taking: Reported on 07/18/2024 02/09/23     amLODipine  (NORVASC ) 5 MG tablet Take 1 tablet by mouth daily. Patient not taking: Reported on 07/18/2024    [provider]  amoxicillin  (AMOXIL ) 500 MG capsule Take 1 capsule (500 mg total) by mouth every 8 (eight) hours. Patient not taking: Reported on 07/18/2024 03/20/24     Ascorbic Acid (VITAMIN C) 500 MG CAPS See admin instructions.    [provider]  azithromycin  (ZITHROMAX ) 500 MG tablet Take 1 tablet (500 mg total) by mouth 2 (two) times daily for 2 days. 02/28/24     Brexpiprazole  (REXULTI ) 0.5 MG TABS  Take 1 tablet (0.5 mg total) by mouth daily. 03/20/24     cephALEXin  (KEFLEX ) 500 MG capsule Take 1 capsule (500 mg total) by mouth 4 (four) times daily. Patient not taking: Reported on 07/18/2024 07/14/24     Cholecalciferol (VITAMIN D3) 125 MCG (5000 UT) TABS Take 5,000 Units by mouth daily.  Patient not taking: Reported on 07/18/2024    [provider]  clotrimazole-betamethasone (LOTRISONE) cream Apply  1 application topically 2 (two) times daily. Patient not taking: Reported on 07/18/2024 11/21/20   [provider]  Cyanocobalamin  (B-12) 1000 MCG TABS 3,000 mcg. Patient not taking: Reported on 07/18/2024 04/02/22   [provider]  loperamide  (IMODIUM ) 2 MG capsule Take 1 capsule (2 mg total) by mouth every evening. Patient not taking: Reported on 07/18/2024 11/04/23     predniSONE  (DELTASONE ) 20 MG tablet Take 1 tablet (20 mg total) by mouth 2 (two) times daily for 3 days Patient not taking: Reported on 07/18/2024 07/14/24     Semaglutide , 2 MG/DOSE, (OZEMPIC , 2 MG/DOSE,) 8 MG/3ML SOPN Inject 2 mg into the skin once weekly. 09/17/22     Semaglutide , 2 MG/DOSE, (OZEMPIC , 2 MG/DOSE,) 8 MG/3ML SOPN Inject 2 mg into the skin once a week. 03/02/23     Semaglutide , 2 MG/DOSE, (OZEMPIC , 2 MG/DOSE,) 8 MG/3ML SOPN Inject 2 mg into the skin once a week. 03/30/23     Semaglutide , 2 MG/DOSE, (OZEMPIC , 2 MG/DOSE,) 8 MG/3ML SOPN Inject 2 mg into the skin once a week. 06/15/23     Semaglutide , 2 MG/DOSE, (OZEMPIC , 2 MG/DOSE,) 8 MG/3ML SOPN Inject 2 mg into the skin once a week. 09/14/23     traZODone  (DESYREL ) 50 MG tablet Take 1 tablet (50 mg total) by mouth daily. Patient not taking: Reported on 07/18/2024 04/08/23     TURMERIC-GINGER PO Take 1 tablet by mouth daily. Patient not taking: Reported on 07/18/2024    [provider]  Zinc 50 MG TABS Take 50 mg by mouth daily.  Patient not taking: Reported on 07/18/2024    [provider]  zolpidem  (AMBIEN  CR) 6.25 MG CR tablet Take 1  tablet (6.25 mg total) by mouth at bedtime as needed. 12/21/23 01/25/24      Physical Exam: Vitals:   07/18/24 1930 07/18/24 2000 07/18/24 2100 07/18/24 2115  BP: (!) 184/98 (!) 183/110 (!) 155/89 (!) 158/85  Pulse: 91 89 90 93  Resp:    18  Temp:   98 F (36.7 C)   TempSrc:   Oral   SpO2: 95% 97% 96% 97%   Physical Exam:  General: No acute distress, well developed, well nourished HEENT: Normocephalic, atraumatic, PERRL Cardiovascular: Normal rate and rhythm. Distal pulses intact. Pulmonary: Normal pulmonary effort, normal breath sounds Gastrointestinal: Nondistended abdomen, soft, non-tender, normoactive bowel sounds, Musculoskeletal:Normal ROM, no lower ext edema Lymphadenopathy: No cervical LAD. Skin: Skin is warm and dry. Neuro: No focal deficits noted, alert to name only.  He repeats what I say and hums and is jovial. PSYCH: Inattentive but cooperative  Data Reviewed:  Results for orders placed or performed during the hospital encounter of 07/18/24 (from the past 24 hours)  Resp panel by RT-PCR (RSV, Flu A&B, Covid) Anterior Nasal Swab     Status: None   Collection Time: 07/18/24  6:05 PM   Specimen: Anterior Nasal Swab  Result Value Ref Range   SARS Coronavirus 2 by RT PCR NEGATIVE NEGATIVE   Influenza A by PCR NEGATIVE NEGATIVE   Influenza B by PCR NEGATIVE NEGATIVE   Resp Syncytial Virus by PCR NEGATIVE NEGATIVE  Blood Culture (routine x 2)     Status: None (Preliminary result)   Collection Time: 07/18/24  6:11 PM   Specimen: Right Antecubital; Blood  Result Value Ref Range   Specimen Description      RIGHT ANTECUBITAL Performed at [facility], [address] [place], [place] [address]    Special Requests  BOTTLES DRAWN AEROBIC AND ANAEROBIC Blood Culture adequate volume Performed at [facility], [address], [place], [place] [address]    Culture PENDING    Report Status PENDING   I-Stat Lactic Acid, ED     Status: None    Collection Time: 07/18/24  6:22 PM  Result Value Ref Range   Lactic Acid, Venous 1.5 0.5 - 1.9 mmol/L  Comprehensive metabolic panel     Status: Abnormal   Collection Time: 07/18/24  6:29 PM  Result Value Ref Range   Sodium 141 135 - 145 mmol/L   Potassium 3.7 3.5 - 5.1 mmol/L   Chloride 103 98 - 111 mmol/L   CO2 27 22 - 32 mmol/L   Glucose, Bld 159 (H) 70 - 99 mg/dL   BUN 19 8 - 23 mg/dL   Creatinine, Ser 9.33 0.61 - 1.24 mg/dL   Calcium  9.2 8.9 - 10.3 mg/dL   Total Protein 7.0 6.5 - 8.1 g/dL   Albumin 3.9 3.5 - 5.0 g/dL   AST 18 15 - 41 U/L   ALT 12 0 - 44 U/L   Alkaline Phosphatase 75 38 - 126 U/L   Total Bilirubin 0.8 0.0 - 1.2 mg/dL   GFR, Estimated >39 >39 mL/min   Anion gap 11 5 - 15  CBC with Differential     Status: Abnormal   Collection Time: 07/18/24  6:29 PM  Result Value Ref Range   WBC 12.5 (H) 4.0 - 10.5 K/uL   RBC 3.96 (L) 4.22 - 5.81 MIL/uL   Hemoglobin 12.8 (L) 13.0 - 17.0 g/dL   HCT 61.0 (L) 60.9 - 47.9 %   MCV 98.2 80.0 - 100.0 fL   MCH 32.3 26.0 - 34.0 pg   MCHC 32.9 30.0 - 36.0 g/dL   RDW 87.9 88.4 - 84.4 %   Platelets 148 (L) 150 - 400 K/uL   nRBC 0.0 0.0 - 0.2 %   Neutrophils Relative % 79 %   Neutro Abs 9.8 (H) 1.7 - 7.7 K/uL   Lymphocytes Relative 7 %   Lymphs Abs 0.9 0.7 - 4.0 K/uL   Monocytes Relative 14 %   Monocytes Absolute 1.8 (H) 0.1 - 1.0 K/uL   Eosinophils Relative 0 %   Eosinophils Absolute 0.0 0.0 - 0.5 K/uL   Basophils Relative 0 %   Basophils Absolute 0.0 0.0 - 0.1 K/uL   Immature Granulocytes 0 %   Abs Immature Granulocytes 0.05 0.00 - 0.07 K/uL  Protime-INR     Status: Abnormal   Collection Time: 07/18/24  6:29 PM  Result Value Ref Range   Prothrombin Time 16.9 (H) 11.4 - 15.2 seconds   INR 1.3 (H) 0.8 - 1.2   CT chest, abdomen, pelvis IMPRESSION: 1. Acute pulmonary embolus in the right lower lobar pulmonary artery extending into segmental branches, without CT evidence of right heart strain. 2. Enlarged central  pulmonary arteries, compatible with pulmonary arterial hypertension. 3. Circumferential rectal wall thickening and mild presacral edema, which may reflect infectious or inflammatory proctitis, without obstruction or perforation. 4. Cholelithiasis with gallbladder distention, without CT evidence of acute cholecystitis. 5. Extensive multivessel coronary artery calcifications. 6. Moderate descending and sigmoid colonic diverticulosis, without acute diverticulitis. 7. Small hiatal hernia. 8. RAF score aortic atherosclerosis (ICD10-I70.0).  Assessment and Plan:  RLL PE with mild respiratory failure requiring 2 L O2 nasal cannula.  CT did not reveal any evidence of heart strain. - IV Heparin  started - Continue O2 [place] as needed -  The patient has proctitis on CT but no other clear sign of infection.  He was started on Rocephin  and Zithromax  empirically.  Cultures were drawn.  Will continue the Rocephin  at this time but antibiotics may be able to be discontinued soon - The patient was put in the sepsis protocol but I am not sure he fully meets criteria for sepsis with the elevated white blood cell count but no other significant derangement of his vital signs. Monitor  2. Advanced dementia - Monitor for delirium.  The patient has not been hospitalized in many years.  3.  Ambulatory dysfunction-the patient's wife would like him to return home but he needs to be able to ambulate with a walker as before.  She is not able to lift him. - PT/ OT - Ambulate while hospitalized   4. DMT2 - Hold Ozempic .  Corrective dosed insulin  as needed. Monitor blood sugars  5. Htn - Continue AIIRB     Advance Care Planning:   Code Status: Limited: Do not attempt resuscitation (DNR) -DNR-LIMITED -Do Not Intubate/DNI   The patient's wife is his healthcare power of attorney and she reports that he is DNR and that he has advanced directives filled out.  Consults: none   Family Communication: The patient's wife and  oldest son are at bedside.  Severity of Illness: The appropriate patient status for this patient is INPATIENT. Inpatient status is judged to be reasonable and necessary in order to provide the required intensity of service to ensure the patient's safety. The patient's presenting symptoms, physical exam findings, and initial radiographic and laboratory data in the context of their chronic comorbidities is felt to place them at high risk for further clinical deterioration. Furthermore, it is not anticipated that the patient will be medically stable for discharge from the hospital within 2 midnights of admission.   * I certify that at the point of admission it is my clinical judgment that the patient will require inpatient hospital care spanning beyond 2 midnights from the point of admission due to high intensity of service, high risk for further deterioration and high frequency of surveillance required.*  Author: ARTHEA CHILD, MD 07/18/2024 11:58 PM  For on call review [email or web address].      [1] No Known Allergies  "

## 2024-07-18 NOTE — ED Provider Notes (Signed)
 " [place] EMERGENCY DEPARTMENT AT [facility] Provider Note   CSN: [id number] Arrival date & time: 07/18/24  8251     Patient presents with: Weakness   [name] [name] is a 79 y.o. male.  With a history of dementia, type 2 diabetes, hypertension, aortic aneurysm who presents to the ED for generalized weakness.  Patient was evaluated in this ED yesterday given concern for generalized weakness with right greater than left weakness.  He was evaluated and MRI at that time was negative.  Today his generalized weakness worsened and he also was noted to be febrile with a temp of 101 at home.  No nausea vomiting diarrhea cough or congestion.  No trauma.  Additional history limited secondary to dementia    Weakness      Prior to Admission medications  Medication Sig Start Date End Date Taking? Authorizing Provider  acetaminophen  (TYLENOL ) 500 MG tablet Take 1,000 mg by mouth every 6 (six) hours as needed for moderate pain.   Yes [provider]  aspirin EC 81 MG tablet Take 81 mg by mouth daily.   Yes [provider]  brexpiprazole  (REXULTI ) 1 MG TABS tablet Take 1 tablet (1 mg total) by mouth daily. 05/18/24  Yes   brimonidine  (ALPHAGAN ) 0.2 % ophthalmic solution 3 (three) times daily. 07/02/24  Yes [provider]  docusate sodium  (COLACE) 100 MG capsule Take 200 mg by mouth daily as needed for mild constipation.   Yes [provider]  donepezil  (ARICEPT ) 10 MG tablet Take 1 tablet (10 mg total) by mouth at bedtime. 04/05/24  Yes   dutasteride  (AVODART ) 0.5 MG capsule Take 1 capsule (0.5 mg total) by mouth daily. 07/13/23  Yes   irbesartan  (AVAPRO ) 75 MG tablet Take 1 tablet (75 mg total) by mouth daily. 06/23/24  Yes   memantine  (NAMENDA ) 10 MG tablet Take 1 tablet (10 mg total) by mouth 2 (two) times daily. Start after finishing 5 mg tablets. 06/28/23  Yes   Multiple Vitamin (MULTIVITAMIN) tablet Take 1 tablet by mouth daily.   Yes [provider]  rosuvastatin  (CRESTOR ) 10 MG tablet TAKE 1 TABLET BY MOUTH EVERY DAY 06/23/24  Yes   Semaglutide , 2 MG/DOSE, (OZEMPIC , 2 MG/DOSE,) 8 MG/3ML SOPN Inject 2 mg under the skin once weekly 08/28/21  Yes   sertraline  (ZOLOFT ) 50 MG tablet Take 1/2  tablet by mouth for 4 days then 1 tablet by mouh each morning 12/21/23  Yes   alfuzosin  (UROXATRAL ) 10 MG 24 hr tablet Take 10 mg by mouth daily. Patient not taking: Reported on 07/18/2024    [provider]  alfuzosin  (UROXATRAL ) 10 MG 24 hr tablet Take 1 tablet (10 mg total) by mouth daily. Patient not taking: Reported on 07/18/2024 02/09/23     amLODipine  (NORVASC ) 5 MG tablet Take 1 tablet by mouth daily. Patient not taking: Reported on 07/18/2024    [provider]  amoxicillin  (AMOXIL ) 500 MG capsule Take 1 capsule (500 mg total) by mouth every 8 (eight) hours. Patient not taking: Reported on 07/18/2024 03/20/24     Ascorbic Acid (VITAMIN C) 500 MG CAPS See admin instructions.    [provider]  azithromycin  (ZITHROMAX ) 500 MG tablet Take 1 tablet (500 mg total) by mouth 2 (two) times daily for 2 days. 02/28/24     Brexpiprazole  (REXULTI ) 0.5 MG TABS Take 1 tablet (0.5 mg total) by mouth daily. 03/20/24     cephALEXin  (KEFLEX ) 500 MG capsule Take 1  capsule (500 mg total) by mouth 4 (four) times daily. Patient not taking: Reported on 07/18/2024 07/14/24     Cholecalciferol (VITAMIN D3) 125 MCG (5000 UT) TABS Take 5,000 Units by mouth daily.  Patient not taking: Reported on 07/18/2024    [provider]  clotrimazole-betamethasone (LOTRISONE) cream Apply 1 application topically 2 (two) times daily. Patient not taking: Reported on 07/18/2024 11/21/20   [provider]  Cyanocobalamin  (B-12) 1000 MCG TABS 3,000 mcg. Patient not taking: Reported on 07/18/2024 04/02/22   [provider]  loperamide  (IMODIUM ) 2 MG capsule Take 1 capsule (2 mg total) by mouth every evening. Patient not taking: Reported on  07/18/2024 11/04/23     predniSONE  (DELTASONE ) 20 MG tablet Take 1 tablet (20 mg total) by mouth 2 (two) times daily for 3 days Patient not taking: Reported on 07/18/2024 07/14/24     Semaglutide , 2 MG/DOSE, (OZEMPIC , 2 MG/DOSE,) 8 MG/3ML SOPN Inject 2 mg into the skin once weekly. 09/17/22     Semaglutide , 2 MG/DOSE, (OZEMPIC , 2 MG/DOSE,) 8 MG/3ML SOPN Inject 2 mg into the skin once a week. 03/02/23     Semaglutide , 2 MG/DOSE, (OZEMPIC , 2 MG/DOSE,) 8 MG/3ML SOPN Inject 2 mg into the skin once a week. 03/30/23     Semaglutide , 2 MG/DOSE, (OZEMPIC , 2 MG/DOSE,) 8 MG/3ML SOPN Inject 2 mg into the skin once a week. 06/15/23     Semaglutide , 2 MG/DOSE, (OZEMPIC , 2 MG/DOSE,) 8 MG/3ML SOPN Inject 2 mg into the skin once a week. 09/14/23     traZODone  (DESYREL ) 50 MG tablet Take 1 tablet (50 mg total) by mouth daily. Patient not taking: Reported on 07/18/2024 04/08/23     TURMERIC-GINGER PO Take 1 tablet by mouth daily. Patient not taking: Reported on 07/18/2024    [provider]  Zinc 50 MG TABS Take 50 mg by mouth daily.  Patient not taking: Reported on 07/18/2024    [provider]  zolpidem  (AMBIEN  CR) 6.25 MG CR tablet Take 1 tablet (6.25 mg total) by mouth at bedtime as needed. 12/21/23 01/25/24      Allergies: Patient has no known allergies.    Review of Systems  Neurological:  Positive for weakness.    Updated Vital Signs BP (!) 158/85   Pulse 93   Temp 98 F (36.7 C) (Oral)   Resp 18   SpO2 97%   Physical Exam Vitals and nursing note reviewed.  HENT:     Head: Normocephalic and atraumatic.  Eyes:     Pupils: Pupils are equal, round, and reactive to light.  Cardiovascular:     Rate and Rhythm: Normal rate and regular rhythm.  Pulmonary:     Effort: Pulmonary effort is normal.     Breath sounds: Normal breath sounds.  Abdominal:     Palpations: Abdomen is soft.     Tenderness: There is no abdominal tenderness.  Skin:    General: Skin is warm and dry.  Neurological:      Mental Status: He is alert.     Comments: Oriented to self only Listing to the left Symmetric weakness of bilateral upper and lower extremities Following commands  Psychiatric:        Mood and Affect: Mood normal.     (all labs ordered are listed, but only abnormal results are displayed) Labs Reviewed  COMPREHENSIVE METABOLIC PANEL WITH GFR - Abnormal; Notable for the following components:      Result Value   Glucose, Bld 159 (*)  All other components within normal limits  CBC WITH DIFFERENTIAL/PLATELET - Abnormal; Notable for the following components:   WBC 12.5 (*)    RBC 3.96 (*)    Hemoglobin 12.8 (*)    HCT 38.9 (*)    Platelets 148 (*)    Neutro Abs 9.8 (*)    Monocytes Absolute 1.8 (*)    All other components within normal limits  PROTIME-INR - Abnormal; Notable for the following components:   Prothrombin Time 16.9 (*)    INR 1.3 (*)    All other components within normal limits  RESP PANEL BY RT-PCR (RSV, FLU A&B, COVID)  [id number]  CULTURE, BLOOD (ROUTINE X 2)  CULTURE, BLOOD (ROUTINE X 2)  URINALYSIS, W/ REFLEX TO CULTURE (INFECTION SUSPECTED)  CBC  BASIC METABOLIC PANEL WITH GFR  MAGNESIUM   I-STAT CG4 LACTIC ACID, ED  I-STAT CG4 LACTIC ACID, ED    EKG: EKG Interpretation Date/Time:  Tuesday July 18 2024 18:07:53 EST Ventricular Rate:  90 PR Interval:    QRS Duration:  109 QT Interval:  381 QTC Calculation: 467 R Axis:   -30  Text Interpretation: Atrial flutter with predominant 3:1 AV block LVH with secondary repolarization abnormality Confirmed by Pamella Sharper [phone]) on 07/18/2024 6:34:48 PM  Radiology: CT CHEST ABDOMEN PELVIS W CONTRAST Result Date: 07/18/2024 EXAM: CT CHEST WITH CONTRAST 07/18/2024 08:42:25 PM TECHNIQUE: CT of the chest was performed with the administration of 100 mL iohexol  (OMNIPAQUE ) 300 MG/ML solution. Multiplanar reformatted images are provided for review. Automated exposure control, iterative reconstruction, and/or weight  based adjustment of the mA/kV was utilized to reduce the radiation dose to as low as reasonably achievable. COMPARISON: 07/16/2023 and 10/07/2022. CLINICAL HISTORY: Sepsis, Leukocytosis, generalized weakness. FINDINGS: MEDIASTINUM: Heart: Extensive multivessel coronary artery calcifications. No CT evidence of right heart strain, however. Pericardium is unremarkable. The central airways are clear. Vessels: Intraluminal filling defect is identified within the right lower lobar pulmonary artery branching into several segmental pulmonary arteries in keeping with an acute pulmonary embolus. The central pulmonary arteries are enlarged in keeping with changes of pulmonary arterial hypertension. Mild atherosclerotic calcification within the thoracic aorta. No aortic aneurysm. LYMPH NODES: No mediastinal, hilar or axillary lymphadenopathy. LUNGS AND PLEURA: Small left basilar atelectasis. No focal consolidation or pulmonary edema. No pleural effusion or pneumothorax. SOFT TISSUES/BONES: Osseous structures are age appropriate. No acute bone abnormality. No lytic or blastic bone lesion. No acute abnormality of the soft tissues. UPPER ABDOMEN: Limited images of the upper abdomen demonstrate a small hiatal hernia. Cholelithiasis is present. The gallbladder is distended. No superimposed pericholecystic inflammatory changes are identified, however, to suggest changes of acute cholecystitis. Circumferential rectal wall thickening and mild presacral edema may reflect changes of an infectious or inflammatory proctitis. No evidence of obstruction or perforation. Mild prostatic hypertrophy. Moderate descending and sigmoid colonic diverticulosis without superimposed acute inflammatory change. The appendix is normal. The stomach, small bowel, and large bowel are otherwise unremarkable. Mild aortoiliac atherosclerotic calcification. IMPRESSION: 1. Acute pulmonary embolus in the right lower lobar pulmonary artery extending into segmental  branches, without CT evidence of right heart strain. 2. Enlarged central pulmonary arteries, compatible with pulmonary arterial hypertension. 3. Circumferential rectal wall thickening and mild presacral edema, which may reflect infectious or inflammatory proctitis, without obstruction or perforation. 4. Cholelithiasis with gallbladder distention, without CT evidence of acute cholecystitis. 5. Extensive multivessel coronary artery calcifications. 6. Moderate descending and sigmoid colonic diverticulosis, without acute diverticulitis. 7. Small hiatal hernia. 8. RAF score aortic atherosclerosis (ICD10-I70.0). Electronically  signed by: Dorethia Molt MD 07/18/2024 08:51 PM EST RP Workstation: [id number]   DG Chest Port 1 View Result Date: 07/18/2024 CLINICAL DATA:  Abnormal labs elevated white count EXAM: PORTABLE CHEST 1 VIEW COMPARISON:  10/04/2022 FINDINGS: Hypoventilatory changes with bronchovascular crowding. Cardiomegaly. Bibasilar atelectasis. No pneumothorax IMPRESSION: Hypoventilatory changes with bronchovascular crowding and bibasilar atelectasis. Cardiomegaly. Electronically Signed   By: Luke Bun M.D.   On: 07/18/2024 18:21   CT HEAD WO CONTRAST Result Date: 07/17/2024 CLINICAL DATA:  Dementia, right-sided weakness EXAM: CT HEAD WITHOUT CONTRAST TECHNIQUE: Contiguous axial images were obtained from the base of the skull through the vertex without intravenous contrast. RADIATION DOSE REDUCTION: This exam was performed according to the departmental dose-optimization program which includes automated exposure control, adjustment of the mA and/or kV according to patient size and/or use of iterative reconstruction technique. COMPARISON:  MRI 04/01/2022 FINDINGS: CT HEAD: There is cerebral atrophy. Old lacunar infarct in the head of the caudate nucleus on the right. There is no hemorrhage. No acute ischemic changes. No mass lesion. The ventricles are normal. Skull/sinuses/orbits: No significant  abnormality. IMPRESSION: Cerebral atrophy. Old lacunar infarct in the right caudate nucleus. No acute abnormality. Electronically Signed   By: Nancyann Burns M.D.   On: 07/17/2024 15:53   MR BRAIN WO CONTRAST Result Date: 07/17/2024 CLINICAL DATA:  Neuro deficit, acute stroke suspected EXAM: MRI HEAD WITHOUT CONTRAST TECHNIQUE: Multiplanar, multiecho pulse sequences of the brain and surrounding structures were obtained without intravenous contrast. COMPARISON:  April 01, 2022 FINDINGS: MRI brain: There is a small old infarct in the medial left occipital lobe with encephalomalacia. Old lacunar infarct in the right caudate nucleus. Old lacunar infarct in the right-side of the cerebellum. No acute infarct. The ventricles are normal. No mass lesion. There are normal flow signals in the carotid arteries and basilar artery. No significant bone marrow signal abnormality. No significant abnormality in the paranasal sinuses or soft tissues. IMPRESSION: 1. 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 Old lacunar infarcts in the right cerebellum and right caudate nucleus 3. No acute infarct 4. No change from 04/01/2022 Electronically Signed   By: Nancyann Burns M.D.   On: 07/17/2024 15:33     .Critical Care  Performed by: Pamella Ozell LABOR, DO Authorized by: Pamella Ozell LABOR, DO   Critical care provider statement:    Critical care time (minutes):  40   Critical care was necessary to treat or prevent imminent or life-threatening deterioration of the following conditions:  Respiratory failure   Critical care was time spent personally by me on the following activities:  Development of treatment plan with patient or surrogate, discussions with consultants, evaluation of patient's response to treatment, examination of patient, ordering and review of laboratory studies, ordering and review of radiographic studies, ordering and performing treatments and interventions, pulse oximetry, re-evaluation of patient's  condition, review of old charts and obtaining history from patient or surrogate   I assumed direction of critical care for this patient from another provider in my specialty: no     Care discussed with: admitting provider      Medications Ordered in the ED  lactated ringers  infusion ( Intravenous New Bag/Given 07/18/24 1806)  azithromycin  (ZITHROMAX ) tablet 500 mg (has no administration in time range)  heparin  ADULT infusion 100 units/mL (25000 units/250mL) (has no administration in time range)  heparin  bolus via infusion 3,000 Units (has no administration in time range)  sodium chloride  flush (NS) 0.9 % injection 3 mL (has  no administration in time range)  acetaminophen  (TYLENOL ) tablet 650 mg (has no administration in time range)    Or  acetaminophen  (TYLENOL ) suppository 650 mg (has no administration in time range)  ondansetron  (ZOFRAN ) tablet 4 mg (has no administration in time range)    Or  ondansetron  (ZOFRAN ) injection 4 mg (has no administration in time range)  bisacodyl  (DULCOLAX) EC tablet 5 mg (has no administration in time range)  lactated ringers  bolus 1,000 mL (0 mLs Intravenous Stopped 07/18/24 1906)  cefTRIAXone  (ROCEPHIN ) 1 g in sodium chloride  0.9 % 100 mL IVPB (1 g Intravenous New Bag/Given 07/18/24 2047)  ondansetron  (ZOFRAN ) injection 4 mg (4 mg Intravenous Given 07/18/24 2047)  iohexol  (OMNIPAQUE ) 300 MG/ML solution 100 mL (100 mLs Intravenous Contrast Given 07/18/24 2026)    Clinical Course as of 07/18/24 2305  Tue Jul 18, 2024  2101 Call taken from radiology Dr. Kimberlee.  CT chest abdomen pelvis shows maybe proctitis no other acute infectious findings.  Incidental finding of right lower lobe PE.  No evidence of right heart strain. [MP]  2130 Informed patient and for members at bedside of today's results including PE.  He does not wear oxygen at home but is requiring 2 L to maintain in high 90s.  Dropped to the high 80s on room air.  This is concerning for hypoxia due to  pulmonary embolism.  Heparin  bolus and drip have been ordered.  He will need to be admitted to hospitalist service.  Discussed with Dr. Arthea (hospitalist who accepts patient for admission. [MP]    Clinical Course User Index [MP] Pamella Ozell LABOR, DO                                 Medical Decision Making 79 year old male with history as above presents to the ED for concern of infection given fever and slightly elevated white blood cell count yesterday.  Was evaluated yesterday for right sided weakness MRI negative for stroke at that time.  EMS was called and gave 500 cc IV fluids (LR) prior to arrival.  Confused at baseline and is at neurologic baseline today.  Concern for infectious symptoms.  Meeting SIRS criteria given reported fever and tachypnea.  Will obtain infectious workup including blood cultures labs UA chest x-ray and continue to monitor hemodynamic status closely.  Will provide another liter of IV fluids here.  Amount and/or Complexity of Data Reviewed Labs: ordered. Radiology: ordered.  Risk Prescription drug management. Decision regarding hospitalization.        Final diagnoses:  Other acute pulmonary embolism, unspecified whether acute cor pulmonale present (HCC)  Acute hypoxemic respiratory failure [facility])    ED Discharge Orders     None          Pamella Ozell LABOR, DO 07/18/24 2305  "

## 2024-07-19 ENCOUNTER — Inpatient Hospital Stay (HOSPITAL_COMMUNITY)

## 2024-07-19 DIAGNOSIS — Z86711 Personal history of pulmonary embolism: Secondary | ICD-10-CM | POA: Diagnosis not present

## 2024-07-19 DIAGNOSIS — I82461 Acute embolism and thrombosis of right calf muscular vein: Secondary | ICD-10-CM

## 2024-07-19 DIAGNOSIS — R262 Difficulty in walking, not elsewhere classified: Secondary | ICD-10-CM

## 2024-07-19 DIAGNOSIS — E119 Type 2 diabetes mellitus without complications: Secondary | ICD-10-CM | POA: Diagnosis not present

## 2024-07-19 DIAGNOSIS — F0390 Unspecified dementia without behavioral disturbance: Secondary | ICD-10-CM | POA: Diagnosis not present

## 2024-07-19 DIAGNOSIS — I2694 Multiple subsegmental pulmonary emboli without acute cor pulmonale: Secondary | ICD-10-CM | POA: Diagnosis not present

## 2024-07-19 DIAGNOSIS — I10 Essential (primary) hypertension: Secondary | ICD-10-CM | POA: Diagnosis not present

## 2024-07-19 LAB — CBC
HCT: 33.5 % — ABNORMAL LOW (ref 39.0–52.0)
Hemoglobin: 11.2 g/dL — ABNORMAL LOW (ref 13.0–17.0)
MCH: 32.4 pg (ref 26.0–34.0)
MCHC: 33.4 g/dL (ref 30.0–36.0)
MCV: 96.8 fL (ref 80.0–100.0)
Platelets: 135 K/uL — ABNORMAL LOW (ref 150–400)
RBC: 3.46 MIL/uL — ABNORMAL LOW (ref 4.22–5.81)
RDW: 11.9 % (ref 11.5–15.5)
WBC: 10.4 K/uL (ref 4.0–10.5)
nRBC: 0 % (ref 0.0–0.2)

## 2024-07-19 LAB — HEPARIN LEVEL (UNFRACTIONATED)
Heparin Unfractionated: 0.16 [IU]/mL — ABNORMAL LOW (ref 0.30–0.70)
Heparin Unfractionated: 0.34 [IU]/mL (ref 0.30–0.70)

## 2024-07-19 LAB — URINALYSIS, W/ REFLEX TO CULTURE (INFECTION SUSPECTED)
Bilirubin Urine: NEGATIVE
Glucose, UA: NEGATIVE mg/dL
Ketones, ur: NEGATIVE mg/dL
Nitrite: NEGATIVE
Protein, ur: 30 mg/dL — AB
Specific Gravity, Urine: >1.046 — ABNORMAL HIGH (ref 1.005–1.030)
pH: 5 (ref 5.0–8.0)

## 2024-07-19 LAB — HEMOGLOBIN A1C
Hgb A1c MFr Bld: 5.6 % (ref 4.8–5.6)
Mean Plasma Glucose: 114.02 mg/dL

## 2024-07-19 LAB — BASIC METABOLIC PANEL WITH GFR
Anion gap: 9 (ref 5–15)
BUN: 13 mg/dL (ref 8–23)
CO2: 27 mmol/L (ref 22–32)
Calcium: 8.6 mg/dL — ABNORMAL LOW (ref 8.9–10.3)
Chloride: 106 mmol/L (ref 98–111)
Creatinine, Ser: 0.52 mg/dL — ABNORMAL LOW (ref 0.61–1.24)
GFR, Estimated: >60 mL/min
Glucose, Bld: 126 mg/dL — ABNORMAL HIGH (ref 70–99)
Potassium: 3.3 mmol/L — ABNORMAL LOW (ref 3.5–5.1)
Sodium: 142 mmol/L (ref 135–145)

## 2024-07-19 LAB — ECHOCARDIOGRAM COMPLETE
Area-P 1/2: 4.12 cm2
Height: 72 in
S' Lateral: 3 cm
Weight: 4080 [oz_av]

## 2024-07-19 LAB — CBG MONITORING, ED: Glucose-Capillary: 116 mg/dL — ABNORMAL HIGH (ref 70–99)

## 2024-07-19 LAB — GLUCOSE, CAPILLARY
Glucose-Capillary: 129 mg/dL — ABNORMAL HIGH (ref 70–99)
Glucose-Capillary: 117 mg/dL — ABNORMAL HIGH (ref 70–99)

## 2024-07-19 LAB — MAGNESIUM: Magnesium: 2 mg/dL (ref 1.7–2.4)

## 2024-07-19 MED ORDER — SODIUM CHLORIDE 0.9 % IV SOLN
2.0000 g | INTRAVENOUS | Status: DC
  Administered 2024-07-19 – 2024-07-20 (×2): 2 g via INTRAVENOUS
  Filled 2024-07-19 (×2): qty 20

## 2024-07-19 MED ORDER — BRIMONIDINE TARTRATE 0.2 % OP SOLN
1.0000 [drp] | Freq: Three times a day (TID) | OPHTHALMIC | Status: DC
  Administered 2024-07-19 – 2024-07-27 (×23): 1 [drp] via OPHTHALMIC
  Filled 2024-07-19: qty 5

## 2024-07-19 MED ORDER — BREXPIPRAZOLE 0.5 MG PO TABS: 0.5000 mg | ORAL_TABLET | Freq: Every day | ORAL | Status: DC

## 2024-07-19 MED ORDER — HEPARIN BOLUS VIA INFUSION
3000.0000 [IU] | Freq: Once | INTRAVENOUS | Status: AC
  Administered 2024-07-19: 3000 [IU] via INTRAVENOUS
  Filled 2024-07-19: qty 3000

## 2024-07-19 MED ORDER — LACTATED RINGERS IV SOLN: INTRAVENOUS | Status: DC

## 2024-07-19 MED ORDER — ORAL CARE MOUTH RINSE
15.0000 mL | OROMUCOSAL | Status: DC
  Administered 2024-07-19 – 2024-07-27 (×29): 15 mL via OROMUCOSAL

## 2024-07-19 MED ORDER — INSULIN ASPART 100 UNIT/ML IJ SOLN: 0.0000 [IU] | Freq: Three times a day (TID) | INTRAMUSCULAR | Status: DC

## 2024-07-19 MED ORDER — MEMANTINE HCL 10 MG PO TABS
10.0000 mg | ORAL_TABLET | Freq: Two times a day (BID) | ORAL | Status: DC
  Administered 2024-07-19 – 2024-07-27 (×17): 10 mg via ORAL
  Filled 2024-07-19 (×5): qty 1
  Filled 2024-07-19: qty 2
  Filled 2024-07-19 (×11): qty 1

## 2024-07-19 MED ORDER — IRBESARTAN 75 MG PO TABS
75.0000 mg | ORAL_TABLET | Freq: Every day | ORAL | Status: DC
  Administered 2024-07-19 – 2024-07-27 (×9): 75 mg via ORAL
  Filled 2024-07-19 (×10): qty 1

## 2024-07-19 MED ORDER — DUTASTERIDE 0.5 MG PO CAPS
0.5000 mg | ORAL_CAPSULE | Freq: Every day | ORAL | Status: DC
  Administered 2024-07-19 – 2024-07-27 (×9): 0.5 mg via ORAL
  Filled 2024-07-19 (×9): qty 1

## 2024-07-19 MED ORDER — SENNOSIDES-DOCUSATE SODIUM 8.6-50 MG PO TABS: 1.0000 | ORAL_TABLET | Freq: Two times a day (BID) | ORAL | Status: DC

## 2024-07-19 MED ORDER — DONEPEZIL HCL 10 MG PO TABS
10.0000 mg | ORAL_TABLET | Freq: Every day | ORAL | Status: DC
  Administered 2024-07-19 – 2024-07-27 (×9): 10 mg via ORAL
  Filled 2024-07-19 (×5): qty 1
  Filled 2024-07-19: qty 2
  Filled 2024-07-19 (×3): qty 1

## 2024-07-19 MED ORDER — POTASSIUM CHLORIDE CRYS ER 20 MEQ PO TBCR
40.0000 meq | EXTENDED_RELEASE_TABLET | Freq: Once | ORAL | Status: AC
  Administered 2024-07-19: 40 meq via ORAL
  Filled 2024-07-19: qty 2

## 2024-07-19 MED ORDER — ORAL CARE MOUTH RINSE: 15.0000 mL | OROMUCOSAL | Status: DC | PRN

## 2024-07-19 MED ORDER — SERTRALINE HCL 100 MG PO TABS
100.0000 mg | ORAL_TABLET | Freq: Every day | ORAL | Status: DC
  Administered 2024-07-19 – 2024-07-27 (×9): 100 mg via ORAL
  Filled 2024-07-19: qty 1
  Filled 2024-07-19: qty 2
  Filled 2024-07-19 (×7): qty 1

## 2024-07-19 MED ORDER — BREXPIPRAZOLE 1 MG PO TABS
1.0000 mg | ORAL_TABLET | Freq: Every day | ORAL | Status: DC
  Administered 2024-07-19 – 2024-07-27 (×9): 1 mg via ORAL
  Filled 2024-07-19 (×9): qty 1

## 2024-07-19 MED ORDER — GERHARDT'S BUTT CREAM
TOPICAL_CREAM | Freq: Two times a day (BID) | CUTANEOUS | Status: DC
  Administered 2024-07-23 – 2024-07-25 (×2): 1 via TOPICAL
  Filled 2024-07-19: qty 60

## 2024-07-19 MED ORDER — MAGNESIUM HYDROXIDE 400 MG/5ML PO SUSP
30.0000 mL | Freq: Once | ORAL | Status: DC
  Filled 2024-07-19: qty 30

## 2024-07-19 NOTE — Progress Notes (Signed)
 BLE venous duplex has been completed.  Preliminary results given to Dr. Sebastian.   Results can be found under chart review under CV PROC. 07/19/2024 1:35 PM Anjuli Gemmill RVT, RDMS

## 2024-07-19 NOTE — Progress Notes (Signed)
 " PROGRESS NOTE    [name] [name]  FMW:[id number] DOB: 26-Jun-1946 DOA: 07/18/2024 PCP: Clarice Nottingham, MD    Chief Complaint  Patient presents with   Weakness    Brief Narrative:  Patient 79 year old gentleman history of advanced dementia, type 2 diabetes, hypertension presented with severe weakness x 3 days.  Patient noted at baseline with the use of a walker, wife is primary caregiver however patient noted to have been significantly weak with difficulty ambulating over the past couple of days.  Patient noted to be leaning to the right side and he was brought to the ED on 07/17/2024 where MRI of the brain was done which was negative for any acute infarcts.  Patient noted to have a fever of 101 and subsequently brought to the ED.  CT chest abdomen and pelvis was done concerning for right lower lobe pulmonary embolism.  Patient placed on heparin  drip.  Patient also placed empirically on IV antibiotics pending infectious workup.   Assessment & Plan:   Principal Problem:   Pulmonary embolism (HCC) Active Problems:   Dementia without behavioral disturbance (HCC)   Acute deep vein thrombosis (DVT) of calf muscle vein of right lower extremity (HCC)   Ambulatory dysfunction  #1 right lower lobe PE with mild respiratory failure requiring 2 L O2 nasal cannula/acute right lower extremity DVT involving right peroneal vein - Patient noted to have presented with worsening weakness, difficulty ambulating, noted to have a fever. - CT chest abdomen and pelvis done was consistent with acute PE in the right lower lobe extending to segmental branches without CT evidence of right heart strain. - Lower extremity Dopplers ordered this morning and done with an acute right lower extremity DVT involving the right peroneal vein. - 2D echo ordered and done with a EF of 50 to 55%,NWMA, negative for right ventricular strain. - Continue IV heparin . -Patient on empiric IV antibiotics. - PT/OT.  2.  Advanced  dementia -Resume home regimen Aricept , Namenda , Rexulti .  3.  Hypertension -Continue Avapro .  4.  Ambulatory dysfunction -Per admitting physician it is noted that patient's wife would like him to return home but needs to be able to ambulate with a walker prior to that as wife unable to lift patient. - PT/OT.  5.  Well-controlled diabetes mellitus type 2 -Hemoglobin A1c 5.6. - CBG noted at 116. - Per med rec patient noted to be on Ozempic . - SSI.    DVT prophylaxis: Heparin  drip. Code Status: DNR Family Communication: Updated wife at bedside Disposition: TBD  Status is: Inpatient Remains inpatient appropriate because: Severity of illness   Consultants:  None  Procedures:  CT chest abdomen pelvis 07/18/2024 Chest x-ray 07/18/2024 2D echo 07/19/2024 Lower extremity Dopplers 07/19/2024  Antimicrobials:  Anti-infectives (From admission, onward)    Start     Dose/Rate Route Frequency Ordered Stop   07/19/24 0800  cefTRIAXone  (ROCEPHIN ) 2 g in sodium chloride  0.9 % 100 mL IVPB        2 g 200 mL/hr over 30 Minutes Intravenous Every 24 hours 07/19/24 0043     07/18/24 1930  azithromycin  (ZITHROMAX ) tablet 500 mg  Status:  Discontinued        500 mg Oral Daily 07/18/24 1916 07/19/24 0043   07/18/24 1915  cefTRIAXone  (ROCEPHIN ) 1 g in sodium chloride  0.9 % 100 mL IVPB        1 g 200 mL/hr over 30 Minutes Intravenous  Once 07/18/24 1906 07/18/24 2117   07/18/24 1915  doxycycline (VIBRAMYCIN)  100 mg in sodium chloride  0.9 % 250 mL IVPB  Status:  Discontinued        100 mg 125 mL/hr over 120 Minutes Intravenous Every 12 hours 07/18/24 1906 07/18/24 1916         Subjective: Patient laying on gurney.  Patient with dementia.  Patient denies any chest pain, no shortness of breath, no abdominal pain.  Wife at bedside.  Wife states patient with watery stool this morning.  Objective: Vitals:   07/19/24 1315 07/19/24 1539 07/19/24 1606 07/19/24 2023  BP: (!) 149/91 138/82 (!)  148/89 (!) 151/85  Pulse: (!) 50 68 73 70  Resp: 17 18  14   Temp:  99.7 F (37.6 C) 98.9 F (37.2 C) 98.9 F (37.2 C)  TempSrc:   Oral Oral  SpO2: (!) 84% 97% 97% 97%  Weight:      Height:        Intake/Output Summary (Last 24 hours) at 07/19/2024 2224 Last data filed at 07/19/2024 2034 Gross per 24 hour  Intake 969.53 ml  Output 550 ml  Net 419.53 ml   Filed Weights   07/19/24 0852  Weight: 115.7 kg    Examination:  General exam: Appears calm and comfortable  Respiratory system: Clear to auscultation bilaterally.  No wheezes, no crackles, no rhonchi.  Fair air movement.SABRA Respiratory effort normal. Cardiovascular system: S1 & S2 heard, RRR. No JVD, murmurs, rubs, gallops or clicks. No pedal edema. Gastrointestinal system: Abdomen is nondistended, soft and nontender. No organomegaly or masses felt. Normal bowel sounds heard. Central nervous system: Alert and oriented. No focal neurological deficits. Extremities: Symmetric 5 x 5 power. Skin: No rashes, lesions or ulcers Psychiatry: Judgement and insight appear normal. Mood & affect appropriate.     Data Reviewed: I have personally reviewed following labs and imaging studies  CBC: Recent Labs  Lab 07/17/24 1438 07/18/24 1829 07/19/24 0540  WBC 11.2* 12.5* 10.4  NEUTROABS 7.9* 9.8*  --   HGB 13.5 12.8* 11.2*  HCT 41.8 38.9* 33.5*  MCV 98.6 98.2 96.8  PLT 161 148* 135*    Basic Metabolic Panel: Recent Labs  Lab 07/17/24 1438 07/18/24 1829 07/19/24 0540  NA 139 141 142  K 3.6 3.7 3.3*  CL 102 103 106  CO2 26 27 27   GLUCOSE 131* 159* 126*  BUN 22 19 13   CREATININE 0.67 0.66 0.52*  CALCIUM  9.4 9.2 8.6*  MG  --   --  2.0    GFR: Estimated Creatinine Clearance: 96.7 mL/min (A) (by C-G formula based on SCr of 0.52 mg/dL (L)).  Liver Function Tests: Recent Labs  Lab 07/17/24 1438 07/18/24 1829  AST 22 18  ALT 10 12  ALKPHOS 79 75  BILITOT 1.0 0.8  PROT 7.5 7.0  ALBUMIN 4.1 3.9    CBG: Recent  Labs  Lab 07/19/24 1244 07/19/24 1732 07/19/24 2025  GLUCAP 116* 129* 117*     Recent Results (from the past 240 hours)  Resp panel by RT-PCR (RSV, Flu A&B, Covid) Anterior Nasal Swab     Status: None   Collection Time: 07/18/24  6:05 PM   Specimen: Anterior Nasal Swab  Result Value Ref Range Status   SARS Coronavirus 2 by RT PCR NEGATIVE NEGATIVE Final    Comment: (NOTE) SARS-CoV-2 target nucleic acids are NOT DETECTED.  The SARS-CoV-2 RNA is generally detectable in upper respiratory specimens during the acute phase of infection. The lowest concentration of SARS-CoV-2 viral copies this assay can detect is 138  copies/mL. A negative result does not preclude SARS-Cov-2 infection and should not be used as the sole basis for treatment or other patient management decisions. A negative result may occur with  improper specimen collection/handling, submission of specimen other than nasopharyngeal swab, presence of viral mutation(s) within the areas targeted by this assay, and inadequate number of viral copies(<138 copies/mL). A negative result must be combined with clinical observations, patient history, and epidemiological information. The expected result is Negative.  Fact Sheet for Patients:  bloggercourse.com  Fact Sheet for Healthcare Providers:  seriousbroker.it  This test is no t yet approved or cleared by the [place]  FDA and  has been authorized for detection and/or diagnosis of SARS-CoV-2 by FDA under an Emergency Use Authorization (EUA). This EUA will remain  in effect (meaning this test can be used) for the duration of the COVID-19 declaration under Section 564(b)(1) of the Act, 21 U.S.C.section 360bbb-3(b)(1), unless the authorization is terminated  or revoked sooner.       Influenza A by PCR NEGATIVE NEGATIVE Final   Influenza B by PCR NEGATIVE NEGATIVE Final    Comment: (NOTE) The Xpert Xpress  SARS-CoV-2/FLU/RSV plus assay is intended as an aid in the diagnosis of influenza from Nasopharyngeal swab specimens and should not be used as a sole basis for treatment. Nasal washings and aspirates are unacceptable for Xpert Xpress SARS-CoV-2/FLU/RSV testing.  Fact Sheet for Patients: bloggercourse.com  Fact Sheet for Healthcare Providers: seriousbroker.it  This test is not yet approved or cleared by the [place]  FDA and has been authorized for detection and/or diagnosis of SARS-CoV-2 by FDA under an Emergency Use Authorization (EUA). This EUA will remain in effect (meaning this test can be used) for the duration of the COVID-19 declaration under Section 564(b)(1) of the Act, 21 U.S.C. section 360bbb-3(b)(1), unless the authorization is terminated or revoked.     Resp Syncytial Virus by PCR NEGATIVE NEGATIVE Final    Comment: (NOTE) Fact Sheet for Patients: bloggercourse.com  Fact Sheet for Healthcare Providers: seriousbroker.it  This test is not yet approved or cleared by the [place]  FDA and has been authorized for detection and/or diagnosis of SARS-CoV-2 by FDA under an Emergency Use Authorization (EUA). This EUA will remain in effect (meaning this test can be used) for the duration of the COVID-19 declaration under Section 564(b)(1) of the Act, 21 U.S.C. section 360bbb-3(b)(1), unless the authorization is terminated or revoked.  Performed at [facility], [address], [place], [place] [address]   Blood Culture (routine x 2)     Status: None (Preliminary result)   Collection Time: 07/18/24  6:11 PM   Specimen: Right Antecubital; Blood  Result Value Ref Range Status   Specimen Description   Final    RIGHT ANTECUBITAL Performed at [facility], [address], [place], [place] [address]    Special Requests   Final    BOTTLES DRAWN  AEROBIC AND ANAEROBIC Blood Culture adequate volume Performed at [facility], [address] [place], [place] [address]    Culture   Final    NO GROWTH < 12 HOURS Performed at [facility], [address] [place], [place] [address]    Report Status PENDING  Incomplete         Radiology Studies: ECHOCARDIOGRAM COMPLETE Result Date: 07/19/2024    ECHOCARDIOGRAM REPORT   Patient Name:   BRAXTON VANTREASE Stucker Date of Exam: 07/19/2024 Medical Rec #:  [id number]  Height:       72.0 in Accession #:    [id number]    Weight:       255.1 lb Date of Birth:  1946-02-16    BSA:          2.364 m Patient Age:    78 years      BP:           140/91 mmHg Patient Gender: M             HR:           69 bpm. Exam Location:  Inpatient Procedure: 2D Echo, 3D Echo, Cardiac Doppler, Color Doppler and Strain Analysis            (Both Spectral and Color Flow Doppler were utilized during            procedure). Indications:    Pulmonary Embolus  History:        Patient has prior history of Echocardiogram examinations, most                 recent 03/30/2024. CAD, Pulmonary HTN; Risk Factors:Hypertension,                 Diabetes and Sleep Apnea. Aortic Root Dilation with Ascending                 Aortic Aneurysm.  Sonographer:    Philomena Daring Referring Phys: [id number] Truex V Marga Gramajo  Sonographer Comments: Global longitudinal strain was attempted. IMPRESSIONS  1. Left ventricular ejection fraction, by estimation, is 50 to 55%. The left ventricle has low normal function. The left ventricle has no regional wall motion abnormalities. Left ventricular diastolic parameters were normal.  2. Right ventricular systolic function is normal. The right ventricular size is normal.  3. Left atrial size was mildly dilated.  4. The mitral valve is normal in structure. Trivial mitral valve regurgitation. No evidence of mitral stenosis.  5. The aortic valve is tricuspid. Aortic valve regurgitation is not visualized. No aortic  stenosis is present.  6. Aortic dilatation noted. There is moderate dilatation of the aortic root, measuring 46 mm. FINDINGS  Left Ventricle: Left ventricular ejection fraction, by estimation, is 50 to 55%. The left ventricle has low normal function. The left ventricle has no regional wall motion abnormalities. Strain was performed and the global longitudinal strain is indeterminate. The left ventricular internal cavity size was normal in size. There is no left ventricular hypertrophy. Left ventricular diastolic parameters were normal. Right Ventricle: The right ventricular size is normal. Right ventricular systolic function is normal. Left Atrium: Left atrial size was mildly dilated. Right Atrium: Right atrial size was normal in size. Pericardium: There is no evidence of pericardial effusion. Mitral Valve: The mitral valve is normal in structure. Mild mitral annular calcification. Trivial mitral valve regurgitation. No evidence of mitral valve stenosis. Tricuspid Valve: The tricuspid valve is normal in structure. Tricuspid valve regurgitation is trivial. No evidence of tricuspid stenosis. Aortic Valve: The aortic valve is tricuspid. Aortic valve regurgitation is not visualized. No aortic stenosis is present. Pulmonic Valve: The pulmonic valve was normal in structure. Pulmonic valve regurgitation is not visualized. No evidence of pulmonic stenosis. Aorta: Aortic dilatation noted. There is moderate dilatation of the aortic root, measuring 46 mm. Venous: The inferior vena cava was not well visualized. IAS/Shunts: The interatrial septum was not well visualized.  LEFT VENTRICLE PLAX 2D LVIDd:         4.60 cm   Diastology  LVIDs:         3.00 cm   LV e' medial:    8.38 cm/s LV PW:         1.00 cm   LV E/e' medial:  8.7 LV IVS:        1.00 cm   LV e' lateral:   8.70 cm/s LVOT diam:     2.30 cm   LV E/e' lateral: 8.4 LV SV:         94 LV SV Index:   40 LVOT Area:     4.15 cm LV IVRT:       100 msec                           3D Volume EF:                          3D EF:        60 %                          LV EDV:       200 ml                          LV ESV:       80 ml                          LV SV:        120 ml RIGHT VENTRICLE RV Basal diam:  3.10 cm RV Mid diam:    2.70 cm RV S prime:     9.14 cm/s TAPSE (M-mode): 2.2 cm LEFT ATRIUM             Index        RIGHT ATRIUM           Index LA diam:        4.20 cm 1.78 cm/m   RA Area:     19.30 cm LA Vol (A2C):   92.3 ml 39.05 ml/m  RA Volume:   49.90 ml  21.11 ml/m LA Vol (A4C):   85.3 ml 36.09 ml/m LA Biplane Vol: 90.3 ml 38.21 ml/m  AORTIC VALVE LVOT Vmax:   111.00 cm/s LVOT Vmean:  71.600 cm/s LVOT VTI:    0.226 m  AORTA Ao Root diam: 3.80 cm Ao Asc diam:  3.80 cm MITRAL VALVE MV Area (PHT): 4.12 cm    SHUNTS MV Decel Time: 184 msec    Systemic VTI:  0.23 m MV E velocity: 72.80 cm/s  Systemic Diam: 2.30 cm MV A velocity: 74.70 cm/s MV E/A ratio:  0.97 Redell Shallow MD Electronically signed by Redell Shallow MD Signature Date/Time: 07/19/2024/3:29:51 PM    Final    VAS US  LOWER EXTREMITY VENOUS (DVT) Result Date: 07/19/2024  Lower Venous DVT Study Patient Name:  AUM CAGGIANO Greener  Date of Exam:   07/19/2024 Medical Rec #: [id number]      Accession #:    [id number] Date of Birth: June 12, 1946     Patient Gender: M Patient Age:   48 years Exam Location:  [facility] Procedure:      VAS US  LOWER EXTREMITY VENOUS (DVT) Referring Phys: TORIBIO HUMMER --------------------------------------------------------------------------------  Indications: Pulmonary embolism.  Comparison Study: No previous exams Performing Technologist: Jody Hill RVT, RDMS  Examination  Guidelines: A complete evaluation includes B-mode imaging, spectral Doppler, color Doppler, and power Doppler as needed of all accessible portions of each vessel. Bilateral testing is considered an integral part of a complete examination. Limited examinations for reoccurring indications may be performed as noted. The reflux  portion of the exam is performed with the patient in reverse Trendelenburg.  +---------+---------------+---------+-----------+----------+--------------+ RIGHT    CompressibilityPhasicitySpontaneityPropertiesThrombus Aging +---------+---------------+---------+-----------+----------+--------------+ CFV      Full           Yes      Yes                                 +---------+---------------+---------+-----------+----------+--------------+ SFJ      Full                                                        +---------+---------------+---------+-----------+----------+--------------+ FV Prox  Full           Yes      Yes                                 +---------+---------------+---------+-----------+----------+--------------+ FV Mid   Full           Yes      Yes                                 +---------+---------------+---------+-----------+----------+--------------+ FV DistalFull           Yes      Yes                                 +---------+---------------+---------+-----------+----------+--------------+ PFV      Full                                                        +---------+---------------+---------+-----------+----------+--------------+ POP      Full           Yes      Yes                                 +---------+---------------+---------+-----------+----------+--------------+ PTV      Full                                                        +---------+---------------+---------+-----------+----------+--------------+ PERO     None           No       No                   Acute          +---------+---------------+---------+-----------+----------+--------------+   +---------+---------------+---------+-----------+----------+------------------+ LEFT     CompressibilityPhasicitySpontaneityPropertiesThrombus Aging     +---------+---------------+---------+-----------+----------+------------------+  CFV      Full           Yes       Yes                                     +---------+---------------+---------+-----------+----------+------------------+ SFJ      Full                                                            +---------+---------------+---------+-----------+----------+------------------+ FV Prox  Full           Yes      Yes                                     +---------+---------------+---------+-----------+----------+------------------+ FV Mid   Full           Yes      Yes                                     +---------+---------------+---------+-----------+----------+------------------+ FV DistalFull           Yes      Yes                                     +---------+---------------+---------+-----------+----------+------------------+ PFV      Full                                                            +---------+---------------+---------+-----------+----------+------------------+ POP      Full           Yes      Yes                                     +---------+---------------+---------+-----------+----------+------------------+ PTV      None           No       No                   Acute - one of                                                           paired             +---------+---------------+---------+-----------+----------+------------------+ PERO     None           No       No                   Acute              +---------+---------------+---------+-----------+----------+------------------+  Soleal   None           No       No                   Acute              +---------+---------------+---------+-----------+----------+------------------+ Gastroc  None           No       No                   Acute              +---------+---------------+---------+-----------+----------+------------------+     Summary: RIGHT: - Findings consistent with acute deep vein thrombosis involving the right peroneal veins.  - There is no evidence of deep  vein thrombosis in the lower extremity.  - No cystic structure found in the popliteal fossa.  LEFT: - Findings consistent with acute deep vein thrombosis involving the left peroneal veins, and left posterior tibial veins. Findings consistent with acute intramuscular thrombosis involving the left soleal veins, and left gastrocnemius veins. - A cystic structure is found in the popliteal fossa.  *See table(s) above for measurements and observations. Electronically signed by Lonni Gaskins MD on 07/19/2024 at 1:43:23 PM.    Final    CT CHEST ABDOMEN PELVIS W CONTRAST Result Date: 07/18/2024 EXAM: CT CHEST WITH CONTRAST 07/18/2024 08:42:25 PM TECHNIQUE: CT of the chest was performed with the administration of 100 mL iohexol  (OMNIPAQUE ) 300 MG/ML solution. Multiplanar reformatted images are provided for review. Automated exposure control, iterative reconstruction, and/or weight based adjustment of the mA/kV was utilized to reduce the radiation dose to as low as reasonably achievable. COMPARISON: 07/16/2023 and 10/07/2022. CLINICAL HISTORY: Sepsis, Leukocytosis, generalized weakness. FINDINGS: MEDIASTINUM: Heart: Extensive multivessel coronary artery calcifications. No CT evidence of right heart strain, however. Pericardium is unremarkable. The central airways are clear. Vessels: Intraluminal filling defect is identified within the right lower lobar pulmonary artery branching into several segmental pulmonary arteries in keeping with an acute pulmonary embolus. The central pulmonary arteries are enlarged in keeping with changes of pulmonary arterial hypertension. Mild atherosclerotic calcification within the thoracic aorta. No aortic aneurysm. LYMPH NODES: No mediastinal, hilar or axillary lymphadenopathy. LUNGS AND PLEURA: Small left basilar atelectasis. No focal consolidation or pulmonary edema. No pleural effusion or pneumothorax. SOFT TISSUES/BONES: Osseous structures are age appropriate. No acute bone abnormality.  No lytic or blastic bone lesion. No acute abnormality of the soft tissues. UPPER ABDOMEN: Limited images of the upper abdomen demonstrate a small hiatal hernia. Cholelithiasis is present. The gallbladder is distended. No superimposed pericholecystic inflammatory changes are identified, however, to suggest changes of acute cholecystitis. Circumferential rectal wall thickening and mild presacral edema may reflect changes of an infectious or inflammatory proctitis. No evidence of obstruction or perforation. Mild prostatic hypertrophy. Moderate descending and sigmoid colonic diverticulosis without superimposed acute inflammatory change. The appendix is normal. The stomach, small bowel, and large bowel are otherwise unremarkable. Mild aortoiliac atherosclerotic calcification. IMPRESSION: 1. Acute pulmonary embolus in the right lower lobar pulmonary artery extending into segmental branches, without CT evidence of right heart strain. 2. Enlarged central pulmonary arteries, compatible with pulmonary arterial hypertension. 3. Circumferential rectal wall thickening and mild presacral edema, which may reflect infectious or inflammatory proctitis, without obstruction or perforation. 4. Cholelithiasis with gallbladder distention, without CT evidence of acute cholecystitis. 5. Extensive multivessel coronary artery calcifications. 6. Moderate descending and sigmoid colonic diverticulosis, without acute diverticulitis. 7. Small hiatal hernia. 8.  RAF score aortic atherosclerosis (ICD10-I70.0). Electronically signed by: Dorethia Molt MD 07/18/2024 08:51 PM EST RP Workstation: [id number]   DG Chest Port 1 View Result Date: 07/18/2024 CLINICAL DATA:  Abnormal labs elevated white count EXAM: PORTABLE CHEST 1 VIEW COMPARISON:  10/04/2022 FINDINGS: Hypoventilatory changes with bronchovascular crowding. Cardiomegaly. Bibasilar atelectasis. No pneumothorax IMPRESSION: Hypoventilatory changes with bronchovascular crowding and bibasilar  atelectasis. Cardiomegaly. Electronically Signed   By: Luke Bun M.D.   On: 07/18/2024 18:21        Scheduled Meds:  brexpiprazole   1 mg Oral Daily   brimonidine   1 drop Both Eyes TID   donepezil   10 mg Oral QHS   dutasteride   0.5 mg Oral Daily   Gerhardt's butt cream   Topical BID   insulin  aspart  0-6 Units Subcutaneous TID WC   irbesartan   75 mg Oral Daily   magnesium  hydroxide  30 mL Oral Once   memantine   10 mg Oral BID   mouth rinse  15 mL Mouth Rinse 4 times per day   sertraline   100 mg Oral Daily   sodium chloride  flush  3 mL Intravenous Q12H   Continuous Infusions:  cefTRIAXone  (ROCEPHIN )  IV 2 g (07/19/24 0740)   heparin  1,900 Units/hr (07/19/24 0913)   lactated ringers  125 mL/hr at 07/19/24 2013     LOS: 1 day    Time spent: 40 minutes    Toribio Hummer, MD [facility]   To contact the attending provider between 7A-7P or the covering provider during after hours 7P-7A, please log into the web site [email or web address] and access using universal  password for that web site. If you do not have the password, please call the hospital operator.  07/19/2024, 10:24 PM    "

## 2024-07-19 NOTE — Telephone Encounter (Signed)
 Darrell,   Please call and find out what questions they have.   Thanks.

## 2024-07-19 NOTE — Plan of Care (Incomplete)
 Patient alert and oriented X4, admitted  Problem: Education: Goal: Ability to describe self-care measures that may prevent or decrease complications (Diabetes Survival Skills Education) will improve Outcome: Progressing Goal: Individualized Educational Video(s) Outcome: Progressing   Problem: Coping: Goal: Ability to adjust to condition or change in health will improve Outcome: Progressing   Problem: Fluid Volume: Goal: Ability to maintain a balanced intake and output will improve Outcome: Progressing   Problem: Health Behavior/Discharge Planning: Goal: Ability to identify and utilize available resources and services will improve Outcome: Progressing Goal: Ability to manage health-related needs will improve Outcome: Progressing   Problem: Metabolic: Goal: Ability to maintain appropriate glucose levels will improve Outcome: Progressing   Problem: Nutritional: Goal: Maintenance of adequate nutrition will improve Outcome: Progressing Goal: Progress toward achieving an optimal weight will improve Outcome: Progressing   Problem: Skin Integrity: Goal: Risk for impaired skin integrity will decrease Outcome: Progressing   Problem: Tissue Perfusion: Goal: Adequacy of tissue perfusion will improve Outcome: Progressing

## 2024-07-19 NOTE — ED Notes (Signed)
 PT incontinent of stool and urine.  PT cleansed and repositioned by primary RN and NT.  Skin intact.  Placed condom catheter for comfort.  Pt tolerated well.  Spouse at bedside.  Urine specimen collected and sent via tube station.

## 2024-07-19 NOTE — Telephone Encounter (Signed)
 Pt currently admitted. No number left to contact son. Will forward to provider and covering as FYI.

## 2024-07-19 NOTE — Evaluation (Signed)
 Physical Therapy Evaluation Patient Details Name: [name] [name] MRN: [id number] DOB: [birth year] Today's Date: 07/19/2024  History of Present Illness  Patient is a 79 yo male presenting with weakness on 07/18/24. Recent ED visit on 1/12 with MRI negative. Admitted with pulmonary embolism. PMH includes:  dementia, type 2 diabetes, hypertension, aortic aneurysm  Clinical Impression  PTA and recent functional decline, pt was IND with mobility and set-up assistance for ADLs with occasional assistance. Recently he has needed more help bathing, dressing, and transfers. At time of evaluation, pt is Total assist +2 to attempt to transition into long sitting; pt very resistant to mobility when OT attempted to bring his legs towards EOB. He is functioning well below his baseline; see problem list below. PT recommends discharge to low intensity rehab at SNF for <3hrs/day for improve functional mobility, strength, endurance and balance. Educated family on recommendations, and that PT will continue to follow patient while he is in the hospital.         If plan is discharge home, recommend the following: Two people to help with walking and/or transfers;Two people to help with bathing/dressing/bathroom;Direct supervision/assist for medications management;Assistance with cooking/housework;Direct supervision/assist for financial management;Assist for transportation;Help with stairs or ramp for entrance   Can travel by private vehicle   No    Equipment Recommendations Other (comment) (defer to next level of care)  Recommendations for Other Services       Functional Status Assessment Patient has had a recent decline in their functional status and demonstrates the ability to make significant improvements in function in a reasonable and predictable amount of time.     Precautions / Restrictions Precautions Precautions: Fall Recall of Precautions/Restrictions: Impaired Precaution/Restrictions Comments: baseline  cog deficits secondary to advanced dementia Restrictions Weight Bearing Restrictions Per Provider Order: No      Mobility  Bed Mobility Overal bed mobility: Needs Assistance Bed Mobility: Rolling, Supine to Sit, Sit to Supine Rolling: Total assist, +2 for physical assistance, +2 for safety/equipment   Supine to sit: Total assist, +2 for physical assistance, +2 for safety/equipment Sit to supine: +2 for physical assistance, +2 for safety/equipment, Total assist   General bed mobility comments: No initation to come into long sitting, no sitting balance, actively resisting OT when attempting to sit patient on EOB, PT providing total support at trunk while in long sitting, +3 to roll bilaterally due to peri-care needs    Transfers Overall transfer level: Needs assistance                 General transfer comment: deferred due to safety, zero balance, likely +2-3 for transfer with possible need for mechanical lift    Ambulation/Gait                  Stairs            Wheelchair Mobility     Tilt Bed    Modified Rankin (Stroke Patients Only)       Balance Overall balance assessment: Needs assistance Sitting-balance support: Bilateral upper extremity supported Sitting balance-Leahy Scale: Zero Sitting balance - Comments: Total A at trunk in long sitting, no intiaition to help maintain balance                                     Pertinent Vitals/Pain Pain Assessment Pain Assessment: Faces Faces Pain Scale: Hurts little more Pain Location: generlized pain with mobility (family  reports pt has had right sided pain recently, unknown cause) Pain Descriptors / Indicators: Grimacing, Guarding Pain Intervention(s): Monitored during session, Limited activity within patient's tolerance    Home Living Family/patient expects to be discharged to:: Private residence Living Arrangements: Spouse/significant other Available Help at Discharge:  Available PRN/intermittently;Family;Personal care attendant Type of Home: House Home Access: Stairs to enter Entrance Stairs-Rails: Left Entrance Stairs-Number of Steps: 4-5   Home Layout: One level Home Equipment: [employer] (2 wheels);Shower seat;Shower seat - built in;BSC/3in1 Additional Comments: PCA T,W, Tr 9 to 2:30 and Overnight 10pm to 6 am M, W, F    Prior Function Prior Level of Function : Needs assist;History of Falls (last six months)  Cognitive Assist : ADLs (cognitive)   ADLs (Cognitive): Set up cues       Mobility Comments: fairly mobile, did not need AD, within the last week has been weaker and gait was slowed; generalized decline since Christmas, has slide off couch or bed and required EMS to assist him off floor ADLs Comments: gets assist for dressing, as long as clothes were laid out he could complete, recently requiring assist for feeding and basic needs     Extremity/Trunk Assessment   Upper Extremity Assessment Upper Extremity Assessment: Defer to OT evaluation    Lower Extremity Assessment Lower Extremity Assessment: Generalized weakness;RLE deficits/detail;LLE deficits/detail RLE Deficits / Details: pt very resistant and guarding when attempting to complete ROM assessment LLE Deficits / Details: see RLE comments    Cervical / Trunk Assessment Cervical / Trunk Assessment: Kyphotic  Communication   Communication Communication: Impaired Factors Affecting Communication: Difficulty expressing self;Reduced clarity of speech    Cognition Arousal: Lethargic Behavior During Therapy: Restless   PT - Cognitive impairments: History of cognitive impairments, Orientation   Orientation impairments: Place, Time, Situation                     Following commands: Impaired Following commands impaired: Follows one step commands inconsistently     Cueing Cueing Techniques: Verbal cues, Gestural cues, Tactile cues, Visual cues     General Comments  General comments (skin integrity, edema, etc.): requires 4L O2 currently, O2 sats 92-95% during session    Exercises     Assessment/Plan    PT Assessment Patient needs continued PT services  PT Problem List Decreased strength;Decreased activity tolerance;Decreased balance;Decreased mobility;Decreased safety awareness       PT Treatment Interventions Gait training;Balance training;Neuromuscular re-education;Functional mobility training;Therapeutic activities;Therapeutic exercise;Patient/family education    PT Goals (Current goals can be found in the Care Plan section)  Acute Rehab PT Goals Patient Stated Goal: get back home PT Goal Formulation: With family Time For Goal Achievement: 08/02/24 Potential to Achieve Goals: Fair    Frequency Min 2X/week     Co-evaluation PT/OT/SLP Co-Evaluation/Treatment: Yes Reason for Co-Treatment: Complexity of the patient's impairments (multi-system involvement);To address functional/ADL transfers;For patient/therapist safety;Necessary to address cognition/behavior during functional activity PT goals addressed during session: Mobility/safety with mobility;Strengthening/ROM OT goals addressed during session: ADL's and self-care;Strengthening/ROM       AM-PAC PT 6 Clicks Mobility  Outcome Measure Help needed turning from your back to your side while in a flat bed without using bedrails?: Total Help needed moving from lying on your back to sitting on the side of a flat bed without using bedrails?: Total Help needed moving to and from a bed to a chair (including a wheelchair)?: Total Help needed standing up from a chair using your arms (e.g., wheelchair or bedside  chair)?: Total Help needed to walk in hospital room?: Total Help needed climbing 3-5 steps with a railing? : Total 6 Click Score: 6    End of Session   Activity Tolerance: Patient limited by lethargy;Other (comment) (difficulty following commands, pt resistant to mobility) Patient  left: in bed;with family/visitor present;with call bell/phone within reach Nurse Communication: Mobility status;Need for lift equipment PT Visit Diagnosis: Muscle weakness (generalized) (M62.81);Other abnormalities of gait and mobility (R26.89);Difficulty in walking, not elsewhere classified (R26.2)    Time: 9091-9061 PT Time Calculation (min) (ACUTE ONLY): 30 min   Charges:   PT Evaluation $PT Eval Moderate Complexity: 1 Mod             Shikha Bibb H. Khristin Keleher, PT, DPT   [employer] 07/19/2024, 12:54 PM

## 2024-07-19 NOTE — H&P (Incomplete)
 " History and Physical    Patient: [name] [name] FMW:[id number] DOB: 01-01-1946 DOA: 07/18/2024 DOS: the patient was seen and examined on 07/18/2024 PCP: Clarice Nottingham, MD  Patient coming from: home  Chief Complaint:  Chief Complaint  Patient presents with   Weakness   HPI: [name] [name] is a 79 y.o. male with medical history significant for advanced dementia     Review of Systems: As mentioned in the history of present illness. All other systems reviewed and are negative. Past Medical History:  Diagnosis Date   Aneurysm, ascending aorta    Arthritis    Coronary artery disease    pt denies   Diabetes (HCC)    type 2   Difficulty urinating    prostate problem   GERD (gastroesophageal reflux disease)    HIATAL HERNIA REPAIRED -with lap band 2 years ago NO LONGER HAS GERD   History of elevated glucose    IN THE PAST - NO PROBLEMS SINCE GASTRIC BANDING WEIGHT LOSS    History of kidney stones    Hyperlipidemia    Hypertension    resolved with lap band 2 years ago   Memory loss    Pneumonia    Pulmonary hypertension (HCC)    per echo report  pt unaware   Sleep apnea    uses cpap setting is 12   Past Surgical History:  Procedure Laterality Date   COLONOSCOPY  07/17/11   CYSTOSCOPY W/ URETERAL STENT PLACEMENT  07/18/2011   Procedure: CYSTOSCOPY WITH STENT REPLACEMENT;  Surgeon: Ricardo Likens;  Location: [facility];  Service: Urology;  Laterality: Left;   GASTRIC RESTRICTION SURGERY  12/11/2008   lap band   HEMORRHOID SURGERY N/A 08/04/2013   Procedure: EUA,HEMORRHOIDECTOMY;  Surgeon: Donnice KATHEE Lunger, MD;  Location: [facility];  Service: General;  Laterality: N/A;   LAPAROSCOPIC GASTRIC BANDING  12/11/2008   LITHOTRIPSY     Social History:  reports that he quit smoking about 53 years ago. His smoking use included cigarettes. He started smoking about 61 years ago. He has a 16 pack-year smoking history. He has quit using smokeless tobacco.  His smokeless tobacco use  included snuff. He reports current alcohol use. He reports that he does not use drugs.  Allergies[1]  Family History  Problem Relation Age of Onset   Cancer Mother        ovarian   Diabetes Mother    Dementia Mother    Other Father        MVA   Healthy Brother     Prior to Admission medications  Medication Sig Start Date End Date Taking? Authorizing Provider  acetaminophen  (TYLENOL ) 500 MG tablet Take 1,000 mg by mouth every 6 (six) hours as needed for moderate pain.   Yes [provider]  aspirin EC 81 MG tablet Take 81 mg by mouth daily.   Yes [provider]  brexpiprazole  (REXULTI ) 1 MG TABS tablet Take 1 tablet (1 mg total) by mouth daily. 05/18/24  Yes   brimonidine  (ALPHAGAN ) 0.2 % ophthalmic solution 3 (three) times daily. 07/02/24  Yes [provider]  docusate sodium  (COLACE) 100 MG capsule Take 200 mg by mouth daily as needed for mild constipation.   Yes [provider]  donepezil  (ARICEPT ) 10 MG tablet Take 1 tablet (10 mg total) by mouth at bedtime. 04/05/24  Yes   dutasteride  (AVODART ) 0.5 MG capsule Take 1 capsule (0.5 mg total) by mouth daily. 07/13/23  Yes   irbesartan  (AVAPRO )  75 MG tablet Take 1 tablet (75 mg total) by mouth daily. 06/23/24  Yes   memantine  (NAMENDA ) 10 MG tablet Take 1 tablet (10 mg total) by mouth 2 (two) times daily. Start after finishing 5 mg tablets. 06/28/23  Yes   Multiple Vitamin (MULTIVITAMIN) tablet Take 1 tablet by mouth daily.   Yes [provider]  rosuvastatin  (CRESTOR ) 10 MG tablet TAKE 1 TABLET BY MOUTH EVERY DAY 06/23/24  Yes   Semaglutide , 2 MG/DOSE, (OZEMPIC , 2 MG/DOSE,) 8 MG/3ML SOPN Inject 2 mg under the skin once weekly 08/28/21  Yes   sertraline  (ZOLOFT ) 50 MG tablet Take 1/2  tablet by mouth for 4 days then 1 tablet by mouh each morning 12/21/23  Yes   alfuzosin  (UROXATRAL ) 10 MG 24 hr tablet Take 10 mg by mouth daily. Patient not taking: Reported on 07/18/2024    [provider]  alfuzosin  (UROXATRAL ) 10 MG 24 hr tablet Take 1 tablet (10 mg total) by mouth daily. Patient not taking: Reported on 07/18/2024 02/09/23     amLODipine  (NORVASC ) 5 MG tablet Take 1 tablet by mouth daily. Patient not taking: Reported on 07/18/2024    [provider]  amoxicillin  (AMOXIL ) 500 MG capsule Take 1 capsule (500 mg total) by mouth every 8 (eight) hours. Patient not taking: Reported on 07/18/2024 03/20/24     Ascorbic Acid (VITAMIN C) 500 MG CAPS See admin instructions.    [provider]  azithromycin  (ZITHROMAX ) 500 MG tablet Take 1 tablet (500 mg total) by mouth 2 (two) times daily for 2 days. 02/28/24     Brexpiprazole  (REXULTI ) 0.5 MG TABS Take 1 tablet (0.5 mg total) by mouth daily. 03/20/24     cephALEXin  (KEFLEX ) 500 MG capsule Take 1 capsule (500 mg total) by mouth 4 (four) times daily. Patient not taking: Reported on 07/18/2024 07/14/24     Cholecalciferol (VITAMIN D3) 125 MCG (5000 UT) TABS Take 5,000 Units by mouth daily.  Patient not taking: Reported on 07/18/2024    [provider]  clotrimazole-betamethasone (LOTRISONE) cream Apply 1 application topically 2 (two) times daily. Patient not taking: Reported on 07/18/2024 11/21/20   [provider]  Cyanocobalamin  (B-12) 1000 MCG TABS 3,000 mcg. Patient not taking: Reported on 07/18/2024 04/02/22   [provider]  loperamide  (IMODIUM ) 2 MG capsule Take 1 capsule (2 mg total) by mouth every evening. Patient not taking: Reported on 07/18/2024 11/04/23     predniSONE  (DELTASONE ) 20 MG tablet Take 1 tablet (20 mg total) by mouth 2 (two) times daily for 3 days Patient not taking: Reported on 07/18/2024 07/14/24     Semaglutide , 2 MG/DOSE, (OZEMPIC , 2 MG/DOSE,) 8 MG/3ML SOPN Inject 2 mg into the skin once weekly. 09/17/22     Semaglutide , 2 MG/DOSE, (OZEMPIC , 2 MG/DOSE,) 8 MG/3ML SOPN Inject 2 mg into the skin once a week. 03/02/23     Semaglutide , 2 MG/DOSE, (OZEMPIC , 2 MG/DOSE,) 8 MG/3ML  SOPN Inject 2 mg into the skin once a week. 03/30/23     Semaglutide , 2 MG/DOSE, (OZEMPIC , 2 MG/DOSE,) 8 MG/3ML SOPN Inject 2 mg into the skin once a week. 06/15/23     Semaglutide , 2 MG/DOSE, (OZEMPIC , 2 MG/DOSE,) 8 MG/3ML SOPN Inject 2 mg into the skin once a week. 09/14/23     traZODone  (DESYREL ) 50 MG tablet Take 1 tablet (50 mg total) by mouth daily. Patient not taking: Reported on 07/18/2024 04/08/23     TURMERIC-GINGER PO Take 1 tablet by mouth daily. Patient  not taking: Reported on 07/18/2024    [provider]  Zinc 50 MG TABS Take 50 mg by mouth daily.  Patient not taking: Reported on 07/18/2024    [provider]  zolpidem  (AMBIEN  CR) 6.25 MG CR tablet Take 1 tablet (6.25 mg total) by mouth at bedtime as needed. 12/21/23 01/25/24      Physical Exam: Vitals:   07/18/24 1930 07/18/24 2000 07/18/24 2100 07/18/24 2115  BP: (!) 184/98 (!) 183/110 (!) 155/89 (!) 158/85  Pulse: 91 89 90 93  Resp:    18  Temp:   98 F (36.7 C)   TempSrc:   Oral   SpO2: 95% 97% 96% 97%   *** Data Reviewed: {Tip this will not be part of the note when signed- Document your independent interpretation of telemetry tracing, EKG, lab, Radiology test or any other diagnostic tests. Add any new diagnostic test ordered today. (Optional):26781} {Results:26384}  Assessment and Plan: No notes have been filed under this hospital service. Service: Hospitalist     Advance Care Planning:   Code Status: Limited: Do not attempt resuscitation (DNR) -DNR-LIMITED -Do Not Intubate/DNI  ***  Consults:   Family Communication:   Severity of Illness: {Observation/Inpatient:21159}  Author: ARTHEA CHILD, MD 07/18/2024 11:58 PM  For on call review [email or web address].        [1] No Known Allergies "

## 2024-07-19 NOTE — Progress Notes (Signed)
 PHARMACY - ANTICOAGULATION CONSULT NOTE  Pharmacy Consult for heparin   Indication: acute  pulmonary embolus  Allergies[1]  Patient Measurements: Height: 6' (182.9 cm) Weight: 115.7 kg (255 lb) IBW/kg (Calculated) : 77.6 HEPARIN  DW (KG): 102.6  Vital Signs: Temp: 98.3 F (36.8 C) (01/14 0649) Temp Source: Oral (01/14 0649) BP: 149/87 (01/14 0530) Pulse Rate: 75 (01/14 0530)  Labs: Recent Labs    07/17/24 1438 07/18/24 1829 07/19/24 0540 07/19/24 0740  HGB 13.5 12.8* 11.2*  --   HCT 41.8 38.9* 33.5*  --   PLT 161 148* 135*  --   APTT 34  --   --   --   LABPROT 16.3* 16.9*  --   --   INR 1.2 1.3*  --   --   HEPARINUNFRC  --   --   --  0.16*  CREATININE 0.67 0.66 0.52*  --     Estimated Creatinine Clearance: 96.7 mL/min (A) (by C-G formula based on SCr of 0.52 mg/dL (L)).   Medical History: Past Medical History:  Diagnosis Date   Aneurysm, ascending aorta    Arthritis    Coronary artery disease    pt denies   Diabetes (HCC)    type 2   Difficulty urinating    prostate problem   GERD (gastroesophageal reflux disease)    HIATAL HERNIA REPAIRED -with lap band 2 years ago NO LONGER HAS GERD   History of elevated glucose    IN THE PAST - NO PROBLEMS SINCE GASTRIC BANDING WEIGHT LOSS    History of kidney stones    Hyperlipidemia    Hypertension    resolved with lap band 2 years ago   Memory loss    Pneumonia    Pulmonary hypertension (HCC)    per echo report  pt unaware   Sleep apnea    uses cpap setting is 12    Assessment: Patient is an 79 y.o M who presented to the ED on 07/18/24 with generalized weakness and fever.  Chest CT on 07/18/24 showed acute PE in the right lower lobar pulmonary artery extending into segmental branches (no RHS).  He is currently on heparin  drip for VTE treatment.  Today, 07/19/2024: - first heparin  level is subtherapeutic at 0.16 - per pt's RN, no issues with IV line and no bleeding noted   Goal of Therapy:  Heparin  level  0.3-0.7 units/ml Monitor platelets by anticoagulation protocol: Yes   Plan:  - heparin  3000 units IV x1 bolus, then increase drip to 1900 units/hr - check  8 hr heparin  level - monitor for s/sx bleeding   Kysean Sweet P 07/19/2024,8:53 AM      [1] No Known Allergies

## 2024-07-19 NOTE — Evaluation (Signed)
 Occupational Therapy Evaluation Patient Details Name: [name] [name] MRN: [id number] DOB: 08/26/1945 Today's Date: 07/19/2024   History of Present Illness   Patient is a 79 yo male presenting with weakness on 07/18/24. Recent ED visit on 1/12 with MRI negative. Admitted with pulmonary embolism. PMH includes:  dementia, type 2 diabetes, hypertension, aortic aneurysm     Clinical Impressions Prior to this admission, patient living with his wife, however ambulatory and able to complete feeding and dressing with set up. Patient did need assist to shower. Family does have a PCA 3 mornings and evenings to provide assist. Patient has been getting progressively weaker over the last couple of weeks, and has had 2 falls in the last 6 months. Currrently, patient is on 4L O2 (was not oxygen at home) and requiring total A of 2 for all aspects of care. Patient requiring +3 assist for rolling and bed linen change due to BM. Patient unable to follow commands this date and no sitting balance present. OT recommending rehab stint of lesser intensity < 3 hours prior to discharge home. OT will continue to follow acutely.      If plan is discharge home, recommend the following:   Two people to help with walking and/or transfers;Two people to help with bathing/dressing/bathroom;Assistance with cooking/housework;Assistance with feeding;Direct supervision/assist for medications management;Direct supervision/assist for financial management;Assist for transportation;Help with stairs or ramp for entrance;Supervision due to cognitive status     Functional Status Assessment   Patient has had a recent decline in their functional status and demonstrates the ability to make significant improvements in function in a reasonable and predictable amount of time.     Equipment Recommendations   Other (comment) (defer to next venue)     Recommendations for Other Services         Precautions/Restrictions    Precautions Precautions: Fall Recall of Precautions/Restrictions: Impaired Restrictions Weight Bearing Restrictions Per Provider Order: No     Mobility Bed Mobility Overal bed mobility: Needs Assistance Bed Mobility: Rolling, Supine to Sit Rolling: Total assist, +2 for physical assistance, +2 for safety/equipment   Supine to sit: Total assist, +2 for physical assistance, +2 for safety/equipment     General bed mobility comments: No initation to come into long sitting, no sitting balance, actively resisting OT when attempting to sit patient on EOB, +3 to roll due to peri-care needs    Transfers Overall transfer level: Needs assistance                 General transfer comment: deferred due to safety, zero balance      Balance Overall balance assessment: Needs assistance Sitting-balance support: Bilateral upper extremity supported Sitting balance-Leahy Scale: Zero                                     ADL either performed or assessed with clinical judgement   ADL Overall ADL's : Needs assistance/impaired Eating/Feeding: Total assistance;Bed level   Grooming: Total assistance;Bed level   Upper Body Bathing: Total assistance;Bed level   Lower Body Bathing: Total assistance;+2 for physical assistance;+2 for safety/equipment;Bed level   Upper Body Dressing : Total assistance;Bed level   Lower Body Dressing: Total assistance;+2 for physical assistance;+2 for safety/equipment;Bed level   Toilet Transfer: Total assistance;+2 for physical assistance;+2 for safety/equipment Toilet Transfer Details (indicate cue type and reason): bed level rolling Toileting- Clothing Manipulation and Hygiene: +2 for physical assistance;Total assistance;+2 for safety/equipment;Sit  to/from stand;Sitting/lateral lean Toileting - Clothing Manipulation Details (indicate cue type and reason): bed level rolling     Functional mobility during ADLs: Total assistance;+2 for physical  assistance;+2 for safety/equipment;Cueing for safety;Cueing for sequencing General ADL Comments: Prior to this admission, patient living with his wife, however ambulatory and able to complete feeding and dressing with set up. Patient did need assist to shower. Family does have a PCA 3 mornings and evenings to provide assist. Patient has been getting progressively weaker over the last couple of weeks, and has had 2 falls in the last 6 months. Currrently, patient is on 4L O2 (was not oxygen at home) and requiring total A of 2 for all aspects of care. Patient requiring +3 assist for rolling and bed linen change due to BM. Patient unable to follow commands this date and no sitting balance present. OT recommending rehab stint of lesser intensity < 3 hours prior to discharge home. OT will continue to follow acutely.     Vision Baseline Vision/History: 0 No visual deficits Ability to See in Adequate Light: 0 Adequate Patient Visual Report: No change from baseline Vision Assessment?: No apparent visual deficits     Perception Perception: Not tested       Praxis Praxis: Not tested       Pertinent Vitals/Pain Pain Assessment Pain Assessment: PAINAD Breathing: normal Negative Vocalization: occasional moan/groan, low speech, negative/disapproving quality Facial Expression: sad, frightened, frown Body Language: tense, distressed pacing, fidgeting Consolability: distracted or reassured by voice/touch PAINAD Score: 4     Extremity/Trunk Assessment Upper Extremity Assessment Upper Extremity Assessment: Generalized weakness;Right hand dominant   Lower Extremity Assessment Lower Extremity Assessment: Defer to PT evaluation   Cervical / Trunk Assessment Cervical / Trunk Assessment: Kyphotic   Communication Communication Communication: Impaired Factors Affecting Communication: Difficulty expressing self;Reduced clarity of speech   Cognition Arousal: Lethargic Behavior During Therapy:  Restless Cognition: History of cognitive impairments             OT - Cognition Comments: History of dementia, only uttered name clearly, often mumbling, easily redirected, poor command  following                 Following commands: Impaired Following commands impaired: Follows one step commands inconsistently     Cueing  General Comments   Cueing Techniques: Verbal cues;Gestural cues;Tactile cues;Visual cues  VSS on 4L   Exercises     Shoulder Instructions      Home Living Family/patient expects to be discharged to:: Private residence Living Arrangements: Spouse/significant other Available Help at Discharge: Available PRN/intermittently;Family;Personal care attendant Type of Home: House Home Access: Stairs to enter [employer] of Steps: 4-5 Entrance Stairs-Rails: Left Home Layout: One level     Bathroom Shower/Tub: Producer, Television/film/video: Handicapped height     Home Equipment: Agricultural Consultant (2 wheels);Shower seat;Shower seat - built in;BSC/3in1   Additional Comments: PCA T,W, Tr 9 to 2:30 and Overnight 10pm to 6 am M, W, F      Prior Functioning/Environment Prior Level of Function : Needs assist;History of Falls (last six months)             Mobility Comments: fairly mobile, did not need AD, within the last week has been weaker and gait was slowed ADLs Comments: gets assist for dressing, as long as clothes were laid out he could complete, recently requiring assist for feeding and basic needs    OT Problem List: Decreased strength;Decreased range of motion;Decreased activity tolerance;Impaired  balance (sitting and/or standing);Decreased coordination;Decreased cognition;Decreased safety awareness;Decreased knowledge of use of DME or AE;Decreased knowledge of precautions;Cardiopulmonary status limiting activity   OT Treatment/Interventions: Self-care/ADL training;Therapeutic exercise;Energy conservation;DME and/or AE  instruction;Manual therapy;Modalities;Therapeutic activities;Cognitive remediation/compensation;Patient/family education;Balance training      OT Goals(Current goals can be found in the care plan section)   Acute Rehab OT Goals Patient Stated Goal: unable OT Goal Formulation: Patient unable to participate in goal setting Time For Goal Achievement: 08/02/24 Potential to Achieve Goals: Fair   OT Frequency:  Min 2X/week    Co-evaluation              AM-PAC OT 6 Clicks Daily Activity     Outcome Measure Help from another person eating meals?: Total Help from another person taking care of personal grooming?: Total Help from another person toileting, which includes using toliet, bedpan, or urinal?: Total Help from another person bathing (including washing, rinsing, drying)?: Total Help from another person to put on and taking off regular upper body clothing?: Total Help from another person to put on and taking off regular lower body clothing?: Total 6 Click Score: 6   End of Session Nurse Communication: Mobility status  Activity Tolerance: Patient limited by fatigue;Patient limited by lethargy Patient left: in bed;with call bell/phone within reach;with nursing/sitter in room  OT Visit Diagnosis: Unsteadiness on feet (R26.81);Other abnormalities of gait and mobility (R26.89);Muscle weakness (generalized) (M62.81);History of falling (Z91.81);Other symptoms and signs involving cognitive function                Time: 9091-9061 OT Time Calculation (min): 30 min Charges:  OT General Charges $OT Visit: 1 Visit OT Evaluation $OT Eval Moderate Complexity: 1 Mod  Ronal Gift E. Agape Hardiman, OTR/L Acute Rehabilitation Services [phone]   Ronal Gift Salt 07/19/2024, 10:53 AM

## 2024-07-19 NOTE — Progress Notes (Signed)
 PHARMACY - ANTICOAGULATION CONSULT NOTE  Pharmacy Consult for heparin   Indication: acute  pulmonary embolus  Allergies[1]  Patient Measurements: Height: 6' (182.9 cm) Weight: 115.7 kg (255 lb) IBW/kg (Calculated) : 77.6 HEPARIN  DW (KG): 102.6  Vital Signs: Temp: 98.9 F (37.2 C) (01/14 1606) Temp Source: Oral (01/14 1606) BP: 148/89 (01/14 1606) Pulse Rate: 73 (01/14 1606)  Labs: Recent Labs    07/17/24 1438 07/18/24 1829 07/19/24 0540 07/19/24 0740 07/19/24 1751  HGB 13.5 12.8* 11.2*  --   --   HCT 41.8 38.9* 33.5*  --   --   PLT 161 148* 135*  --   --   APTT 34  --   --   --   --   LABPROT 16.3* 16.9*  --   --   --   INR 1.2 1.3*  --   --   --   HEPARINUNFRC  --   --   --  0.16* 0.34  CREATININE 0.67 0.66 0.52*  --   --     Estimated Creatinine Clearance: 96.7 mL/min (A) (by C-G formula based on SCr of 0.52 mg/dL (L)).   Medical History: Past Medical History:  Diagnosis Date   Aneurysm, ascending aorta    Arthritis    Coronary artery disease    pt denies   Diabetes (HCC)    type 2   Difficulty urinating    prostate problem   GERD (gastroesophageal reflux disease)    HIATAL HERNIA REPAIRED -with lap band 2 years ago NO LONGER HAS GERD   History of elevated glucose    IN THE PAST - NO PROBLEMS SINCE GASTRIC BANDING WEIGHT LOSS    History of kidney stones    Hyperlipidemia    Hypertension    resolved with lap band 2 years ago   Memory loss    Pneumonia    Pulmonary hypertension (HCC)    per echo report  pt unaware   Sleep apnea    uses cpap setting is 12    Assessment: Patient is an 79 y.o M who presented to the ED on 07/18/24 with generalized weakness and fever.  Chest CT on 07/18/24 showed acute PE in the right lower lobar pulmonary artery extending into segmental branches (no RHS).  He is currently on heparin  drip for VTE treatment.  Today, 07/19/2024: -Heparin  level 0.34 - therapeutic with heparin  infusing at 1900 units/hr -No complications of  therapy noted  Goal of Therapy:  Heparin  level 0.3-0.7 units/ml Monitor platelets by anticoagulation protocol: Yes   Plan:  -Continue heparin  infusion at 1900 units/hr -Daily heparin  level and CBC -Monitor for s/sx bleeding  -Follow for anticoagulation plan   Stefano MARLA Bologna, PharmD, BCPS Clinical Pharmacist 07/19/2024 7:37 PM        [1] No Known Allergies

## 2024-07-20 ENCOUNTER — Telehealth (HOSPITAL_COMMUNITY): Payer: Self-pay | Admitting: Pharmacy Technician

## 2024-07-20 ENCOUNTER — Other Ambulatory Visit (HOSPITAL_COMMUNITY): Payer: Self-pay

## 2024-07-20 DIAGNOSIS — E119 Type 2 diabetes mellitus without complications: Secondary | ICD-10-CM | POA: Diagnosis not present

## 2024-07-20 DIAGNOSIS — I2694 Multiple subsegmental pulmonary emboli without acute cor pulmonale: Secondary | ICD-10-CM | POA: Diagnosis not present

## 2024-07-20 DIAGNOSIS — I10 Essential (primary) hypertension: Secondary | ICD-10-CM | POA: Diagnosis not present

## 2024-07-20 DIAGNOSIS — F0390 Unspecified dementia without behavioral disturbance: Secondary | ICD-10-CM | POA: Diagnosis not present

## 2024-07-20 DIAGNOSIS — I82461 Acute embolism and thrombosis of right calf muscular vein: Secondary | ICD-10-CM | POA: Diagnosis not present

## 2024-07-20 DIAGNOSIS — R262 Difficulty in walking, not elsewhere classified: Secondary | ICD-10-CM | POA: Diagnosis not present

## 2024-07-20 LAB — GLUCOSE, CAPILLARY
Glucose-Capillary: 125 mg/dL — ABNORMAL HIGH (ref 70–99)
Glucose-Capillary: 123 mg/dL — ABNORMAL HIGH (ref 70–99)
Glucose-Capillary: 144 mg/dL — ABNORMAL HIGH (ref 70–99)
Glucose-Capillary: 110 mg/dL — ABNORMAL HIGH (ref 70–99)

## 2024-07-20 LAB — RENAL FUNCTION PANEL
Albumin: 3 g/dL — ABNORMAL LOW (ref 3.5–5.0)
Anion gap: 8 (ref 5–15)
BUN: 11 mg/dL (ref 8–23)
CO2: 23 mmol/L (ref 22–32)
Calcium: 8.5 mg/dL — ABNORMAL LOW (ref 8.9–10.3)
Chloride: 107 mmol/L (ref 98–111)
Creatinine, Ser: 0.46 mg/dL — ABNORMAL LOW (ref 0.61–1.24)
GFR, Estimated: >60 mL/min
Glucose, Bld: 116 mg/dL — ABNORMAL HIGH (ref 70–99)
Phosphorus: 2.6 mg/dL (ref 2.5–4.6)
Potassium: 3.7 mmol/L (ref 3.5–5.1)
Sodium: 138 mmol/L (ref 135–145)

## 2024-07-20 LAB — URINE CULTURE: Culture: NO GROWTH

## 2024-07-20 LAB — CBC
HCT: 33 % — ABNORMAL LOW (ref 39.0–52.0)
Hemoglobin: 11.2 g/dL — ABNORMAL LOW (ref 13.0–17.0)
MCH: 33.1 pg (ref 26.0–34.0)
MCHC: 33.9 g/dL (ref 30.0–36.0)
MCV: 97.6 fL (ref 80.0–100.0)
Platelets: 137 K/uL — ABNORMAL LOW (ref 150–400)
RBC: 3.38 MIL/uL — ABNORMAL LOW (ref 4.22–5.81)
RDW: 12 % (ref 11.5–15.5)
WBC: 10.5 K/uL (ref 4.0–10.5)
nRBC: 0 % (ref 0.0–0.2)

## 2024-07-20 LAB — HEPARIN LEVEL (UNFRACTIONATED)
Heparin Unfractionated: 0.28 [IU]/mL — ABNORMAL LOW (ref 0.30–0.70)
Heparin Unfractionated: 0.23 [IU]/mL — ABNORMAL LOW (ref 0.30–0.70)

## 2024-07-20 LAB — MAGNESIUM: Magnesium: 2.1 mg/dL (ref 1.7–2.4)

## 2024-07-20 MED ORDER — HEPARIN BOLUS VIA INFUSION
1500.0000 [IU] | Freq: Once | INTRAVENOUS | Status: AC
  Administered 2024-07-20: 1500 [IU] via INTRAVENOUS
  Filled 2024-07-20: qty 1500

## 2024-07-20 NOTE — Progress Notes (Addendum)
 " PROGRESS NOTE    [name] [name]  FMW:[id number] DOB: 1946/03/27 DOA: 07/18/2024 PCP: Clarice Nottingham, MD    Chief Complaint  Patient presents with   Weakness    Brief Narrative:  Patient 79 year old gentleman history of advanced dementia, type 2 diabetes, hypertension presented with severe weakness x 3 days.  Patient noted at baseline with the use of a walker, wife is primary caregiver however patient noted to have been significantly weak with difficulty ambulating over the past couple of days.  Patient noted to be leaning to the right side and he was brought to the ED on 07/17/2024 where MRI of the brain was done which was negative for any acute infarcts.  Patient noted to have a fever of 101 and subsequently brought to the ED.  CT chest abdomen and pelvis was done concerning for right lower lobe pulmonary embolism.  Patient placed on heparin  drip.  Patient also placed empirically on IV antibiotics pending infectious workup.     Assessment & Plan:   Principal Problem:   Pulmonary embolism (HCC) Active Problems:   Dementia without behavioral disturbance (HCC)   Acute deep vein thrombosis (DVT) of calf muscle vein of right lower extremity (HCC)   Ambulatory dysfunction  #1 right lower lobe PE with mild respiratory failure requiring 2 L O2 nasal cannula/acute right lower extremity DVT involving right peroneal vein - Patient noted to have presented with worsening weakness, difficulty ambulating, noted to have a fever. - CT chest abdomen and pelvis done was consistent with acute PE in the right lower lobe extending to segmental branches without CT evidence of right heart strain. - Lower extremity Dopplers done with an acute right lower extremity DVT involving the right peroneal vein. - 2D echo ordered and done with a EF of 50 to 55%,NWMA, negative for right ventricular strain. - Continue IV heparin  and could likely transition to Eliquis  tomorrow.  -Patient noted to have been pancultured due  to concern for fevers with urine cultures with no growth to date, blood cultures pending.  - Continue empiric IV antibiotics and treat for total of 5 days. - PT/OT.   2.  Advanced dementia - Continue home regimen Aricept , Namenda , Rexulti .   3.  Hypertension - Avapro .    4.  Ambulatory dysfunction -Per admitting physician it is noted that patient's wife would like him to return home but needs to be able to ambulate with a walker prior to that as wife unable to lift patient. - PT/OT.   5.  Well-controlled diabetes mellitus type 2 -Hemoglobin A1c 5.6. - CBG noted at 125 this morning. - Per med rec patient noted to be on Ozempic . - SSI.      DVT prophylaxis: Heparin  drip Code Status: DNR Family Communication: Updated wife, son, granddaughter at bedside. Disposition: TBD  Status is: Inpatient Remains inpatient appropriate because: Severity of illness   Consultants:  None  Procedures:  CT chest abdomen pelvis 07/18/2024 Chest x-ray 07/18/2024 2D echo 07/19/2024 Lower extremity Dopplers 07/19/2024  Antimicrobials:  Anti-infectives (From admission, onward)    Start     Dose/Rate Route Frequency Ordered Stop   07/19/24 0800  cefTRIAXone  (ROCEPHIN ) 2 g in sodium chloride  0.9 % 100 mL IVPB        2 g 200 mL/hr over 30 Minutes Intravenous Every 24 hours 07/19/24 0043     07/18/24 1930  azithromycin  (ZITHROMAX ) tablet 500 mg  Status:  Discontinued        500 mg Oral Daily 07/18/24  1916 07/19/24 0043   07/18/24 1915  cefTRIAXone  (ROCEPHIN ) 1 g in sodium chloride  0.9 % 100 mL IVPB        1 g 200 mL/hr over 30 Minutes Intravenous  Once 07/18/24 1906 07/18/24 2117   07/18/24 1915  doxycycline (VIBRAMYCIN) 100 mg in sodium chloride  0.9 % 250 mL IVPB  Status:  Discontinued        100 mg 125 mL/hr over 120 Minutes Intravenous Every 12 hours 07/18/24 1906 07/18/24 1916         Subjective: Patient laying in bed.  Pleasantly confused.  Denies any chest pain or shortness of breath.   No abdominal pain.  No bleeding.  Granddaughter at bedside.  Objective: Vitals:   07/19/24 1606 07/19/24 2023 07/20/24 0013 07/20/24 0441  BP: (!) 148/89 (!) 151/85 (!) 169/90 (!) 168/96  Pulse: 73 70 (!) 58 68  Resp:  14 14 14   Temp: 98.9 F (37.2 C) 98.9 F (37.2 C) 97.7 F (36.5 C) 98.9 F (37.2 C)  TempSrc: Oral Oral Oral Oral  SpO2: 97% 97% 100% 99%  Weight:      Height:        Intake/Output Summary (Last 24 hours) at 07/20/2024 1317 Last data filed at 07/20/2024 1143 Gross per 24 hour  Intake 1682.02 ml  Output 1100 ml  Net 582.02 ml   Filed Weights   07/19/24 0852  Weight: 115.7 kg    Examination:  General exam: NAD Respiratory system: CTAB anterior lung fields.  No wheezes, no crackles, no rhonchi.  Fair air movement.  Speaking in full sentences.  Cardiovascular system: Regular rate rhythm no murmurs rubs or gallops.  No JVD.  No lower extremity edema.   Gastrointestinal system: Abdomen is soft, nontender, nondistended, positive bowel sounds.  No rebound.  No guarding.   Central nervous system: Alert and oriented. No focal neurological deficits. Extremities: Symmetric 5 x 5 power. Skin: No rashes, lesions or ulcers Psychiatry: Judgement and insight appear poor to fair.. Mood & affect appropriate.     Data Reviewed: I have personally reviewed following labs and imaging studies  CBC: Recent Labs  Lab 07/17/24 1438 07/18/24 1829 07/19/24 0540 07/20/24 0642  WBC 11.2* 12.5* 10.4 10.5  NEUTROABS 7.9* 9.8*  --   --   HGB 13.5 12.8* 11.2* 11.2*  HCT 41.8 38.9* 33.5* 33.0*  MCV 98.6 98.2 96.8 97.6  PLT 161 148* 135* 137*    Basic Metabolic Panel: Recent Labs  Lab 07/17/24 1438 07/18/24 1829 07/19/24 0540 07/20/24 0642  NA 139 141 142 138  K 3.6 3.7 3.3* 3.7  CL 102 103 106 107  CO2 26 27 27 23   GLUCOSE 131* 159* 126* 116*  BUN 22 19 13 11   CREATININE 0.67 0.66 0.52* 0.46*  CALCIUM  9.4 9.2 8.6* 8.5*  MG  --   --  2.0 2.1  PHOS  --   --   --   2.6    GFR: Estimated Creatinine Clearance: 96.7 mL/min (A) (by C-G formula based on SCr of 0.46 mg/dL (L)).  Liver Function Tests: Recent Labs  Lab 07/17/24 1438 07/18/24 1829 07/20/24 0642  AST 22 18  --   ALT 10 12  --   ALKPHOS 79 75  --   BILITOT 1.0 0.8  --   PROT 7.5 7.0  --   ALBUMIN 4.1 3.9 3.0*    CBG: Recent Labs  Lab 07/19/24 1244 07/19/24 1732 07/19/24 2025 07/20/24 0750 07/20/24 1202  GLUCAP 116*  129* 117* 125* 123*     Recent Results (from the past 240 hours)  Resp panel by RT-PCR (RSV, Flu A&B, Covid) Anterior Nasal Swab     Status: None   Collection Time: 07/18/24  6:05 PM   Specimen: Anterior Nasal Swab  Result Value Ref Range Status   SARS Coronavirus 2 by RT PCR NEGATIVE NEGATIVE Final    Comment: (NOTE) SARS-CoV-2 target nucleic acids are NOT DETECTED.  The SARS-CoV-2 RNA is generally detectable in upper respiratory specimens during the acute phase of infection. The lowest concentration of SARS-CoV-2 viral copies this assay can detect is 138 copies/mL. A negative result does not preclude SARS-Cov-2 infection and should not be used as the sole basis for treatment or other patient management decisions. A negative result may occur with  improper specimen collection/handling, submission of specimen other than nasopharyngeal swab, presence of viral mutation(s) within the areas targeted by this assay, and inadequate number of viral copies(<138 copies/mL). A negative result must be combined with clinical observations, patient history, and epidemiological information. The expected result is Negative.  Fact Sheet for Patients:  bloggercourse.com  Fact Sheet for Healthcare Providers:  seriousbroker.it  This test is no t yet approved or cleared by the [place]  FDA and  has been authorized for detection and/or diagnosis of SARS-CoV-2 by FDA under an Emergency Use Authorization (EUA). This EUA  will remain  in effect (meaning this test can be used) for the duration of the COVID-19 declaration under Section 564(b)(1) of the Act, 21 U.S.C.section 360bbb-3(b)(1), unless the authorization is terminated  or revoked sooner.       Influenza A by PCR NEGATIVE NEGATIVE Final   Influenza B by PCR NEGATIVE NEGATIVE Final    Comment: (NOTE) The Xpert Xpress SARS-CoV-2/FLU/RSV plus assay is intended as an aid in the diagnosis of influenza from Nasopharyngeal swab specimens and should not be used as a sole basis for treatment. Nasal washings and aspirates are unacceptable for Xpert Xpress SARS-CoV-2/FLU/RSV testing.  Fact Sheet for Patients: bloggercourse.com  Fact Sheet for Healthcare Providers: seriousbroker.it  This test is not yet approved or cleared by the [place]  FDA and has been authorized for detection and/or diagnosis of SARS-CoV-2 by FDA under an Emergency Use Authorization (EUA). This EUA will remain in effect (meaning this test can be used) for the duration of the COVID-19 declaration under Section 564(b)(1) of the Act, 21 U.S.C. section 360bbb-3(b)(1), unless the authorization is terminated or revoked.     Resp Syncytial Virus by PCR NEGATIVE NEGATIVE Final    Comment: (NOTE) Fact Sheet for Patients: bloggercourse.com  Fact Sheet for Healthcare Providers: seriousbroker.it  This test is not yet approved or cleared by the [place]  FDA and has been authorized for detection and/or diagnosis of SARS-CoV-2 by FDA under an Emergency Use Authorization (EUA). This EUA will remain in effect (meaning this test can be used) for the duration of the COVID-19 declaration under Section 564(b)(1) of the Act, 21 U.S.C. section 360bbb-3(b)(1), unless the authorization is terminated or revoked.  Performed at [facility], [address], [place], [place] [address]   Blood Culture (routine x 2)     Status: None (Preliminary result)   Collection Time: 07/18/24  6:11 PM   Specimen: Right Antecubital; Blood  Result Value Ref Range Status   Specimen Description   Final    RIGHT ANTECUBITAL Performed at [facility], [address], [place], [place] [address]    Special Requests  Final    BOTTLES DRAWN AEROBIC AND ANAEROBIC Blood Culture adequate volume Performed at [facility], [address], [place], [place] [address]    Culture   Final    NO GROWTH 2 DAYS Performed at [facility], [address], [place], [place] [address]    Report Status PENDING  Incomplete  Urine Culture     Status: None   Collection Time: 07/19/24  2:01 AM   Specimen: Urine, Random  Result Value Ref Range Status   Specimen Description   Final    URINE, RANDOM Performed at [facility], [address], [place], [place] [address]    Special Requests   Final    NONE Reflexed from [id number] Performed at [facility], [address] [place], [place] [address]    Culture   Final    NO GROWTH Performed at [facility], [address], [place], [place] [address]    Report Status 07/20/2024 FINAL  Final  Blood Culture (routine x 2)     Status: None (Preliminary result)   Collection Time: 07/19/24  5:51 PM   Specimen: BLOOD  Result Value Ref Range Status   Specimen Description   Final    BLOOD BLOOD RIGHT HAND Performed at [facility], [address] [place], [place] [address]    Special Requests   Final    BOTTLES DRAWN AEROBIC AND ANAEROBIC Blood Culture adequate volume Performed at [facility], [address], [place], [place] [address]    Culture   Final    NO GROWTH < 24 HOURS Performed at [facility], [address] [place], [place] [address]    Report Status PENDING  Incomplete         Radiology  Studies: ECHOCARDIOGRAM COMPLETE Result Date: 07/19/2024    ECHOCARDIOGRAM REPORT   Patient Name:   [name] [name] Date of Exam: 07/19/2024 Medical Rec #:  [id number]     Height:       72.0 in Accession #:    [id number]    Weight:       255.1 lb Date of Birth:  1946-01-24    BSA:          2.364 m Patient Age:    78 years      BP:           140/91 mmHg Patient Gender: M             HR:           69 bpm. Exam Location:  Inpatient Procedure: 2D Echo, 3D Echo, Cardiac Doppler, Color Doppler and Strain Analysis            (Both Spectral and Color Flow Doppler were utilized during            procedure). Indications:    Pulmonary Embolus  History:        Patient has prior history of Echocardiogram examinations, most                 recent 03/30/2024. CAD, Pulmonary HTN; Risk Factors:Hypertension,                 Diabetes and Sleep Apnea. Aortic Root Dilation with Ascending                 Aortic Aneurysm.  Sonographer:    Philomena Daring Referring Phys: [id number] TORIBIO LULLA HUMMER  Sonographer Comments:  Global longitudinal strain was attempted. IMPRESSIONS  1. Left ventricular ejection fraction, by estimation, is 50 to 55%. The left ventricle has low normal function. The left ventricle has no regional wall motion abnormalities. Left ventricular diastolic parameters were normal.  2. Right ventricular systolic function is normal. The right ventricular size is normal.  3. Left atrial size was mildly dilated.  4. The mitral valve is normal in structure. Trivial mitral valve regurgitation. No evidence of mitral stenosis.  5. The aortic valve is tricuspid. Aortic valve regurgitation is not visualized. No aortic stenosis is present.  6. Aortic dilatation noted. There is moderate dilatation of the aortic root, measuring 46 mm. FINDINGS  Left Ventricle: Left ventricular ejection fraction, by estimation, is 50 to 55%. The left ventricle has low normal function. The left ventricle has no regional wall motion abnormalities. Strain was  performed and the global longitudinal strain is indeterminate. The left ventricular internal cavity size was normal in size. There is no left ventricular hypertrophy. Left ventricular diastolic parameters were normal. Right Ventricle: The right ventricular size is normal. Right ventricular systolic function is normal. Left Atrium: Left atrial size was mildly dilated. Right Atrium: Right atrial size was normal in size. Pericardium: There is no evidence of pericardial effusion. Mitral Valve: The mitral valve is normal in structure. Mild mitral annular calcification. Trivial mitral valve regurgitation. No evidence of mitral valve stenosis. Tricuspid Valve: The tricuspid valve is normal in structure. Tricuspid valve regurgitation is trivial. No evidence of tricuspid stenosis. Aortic Valve: The aortic valve is tricuspid. Aortic valve regurgitation is not visualized. No aortic stenosis is present. Pulmonic Valve: The pulmonic valve was normal in structure. Pulmonic valve regurgitation is not visualized. No evidence of pulmonic stenosis. Aorta: Aortic dilatation noted. There is moderate dilatation of the aortic root, measuring 46 mm. Venous: The inferior vena cava was not well visualized. IAS/Shunts: The interatrial septum was not well visualized.  LEFT VENTRICLE PLAX 2D LVIDd:         4.60 cm   Diastology LVIDs:         3.00 cm   LV e' medial:    8.38 cm/s LV PW:         1.00 cm   LV E/e' medial:  8.7 LV IVS:        1.00 cm   LV e' lateral:   8.70 cm/s LVOT diam:     2.30 cm   LV E/e' lateral: 8.4 LV SV:         94 LV SV Index:   40 LVOT Area:     4.15 cm LV IVRT:       100 msec                          3D Volume EF:                          3D EF:        60 %                          LV EDV:       200 ml                          LV ESV:       80 ml  LV SV:        120 ml RIGHT VENTRICLE RV Basal diam:  3.10 cm RV Mid diam:    2.70 cm RV S prime:     9.14 cm/s TAPSE (M-mode): 2.2 cm LEFT ATRIUM              Index        RIGHT ATRIUM           Index LA diam:        4.20 cm 1.78 cm/m   RA Area:     19.30 cm LA Vol (A2C):   92.3 ml 39.05 ml/m  RA Volume:   49.90 ml  21.11 ml/m LA Vol (A4C):   85.3 ml 36.09 ml/m LA Biplane Vol: 90.3 ml 38.21 ml/m  AORTIC VALVE LVOT Vmax:   111.00 cm/s LVOT Vmean:  71.600 cm/s LVOT VTI:    0.226 m  AORTA Ao Root diam: 3.80 cm Ao Asc diam:  3.80 cm MITRAL VALVE MV Area (PHT): 4.12 cm    SHUNTS MV Decel Time: 184 msec    Systemic VTI:  0.23 m MV E velocity: 72.80 cm/s  Systemic Diam: 2.30 cm MV A velocity: 74.70 cm/s MV E/A ratio:  0.97 Redell Shallow MD Electronically signed by Redell Shallow MD Signature Date/Time: 07/19/2024/3:29:51 PM    Final    VAS US  LOWER EXTREMITY VENOUS (DVT) Result Date: 07/19/2024  Lower Venous DVT Study Patient Name:  [name] [name] Radermacher  Date of Exam:   07/19/2024 Medical Rec #: [id number]      Accession #:    [id number] Date of Birth: [birth year]     Patient Gender: M Patient Age:   55 years Exam Location:  [facility] Procedure:      VAS US  LOWER EXTREMITY VENOUS (DVT) Referring Phys: TORIBIO HUMMER --------------------------------------------------------------------------------  Indications: Pulmonary embolism.  Comparison Study: No previous exams Performing Technologist: Jody Hill RVT, RDMS  Examination Guidelines: A complete evaluation includes B-mode imaging, spectral Doppler, color Doppler, and power Doppler as needed of all accessible portions of each vessel. Bilateral testing is considered an integral part of a complete examination. Limited examinations for reoccurring indications may be performed as noted. The reflux portion of the exam is performed with the patient in reverse Trendelenburg.  +---------+---------------+---------+-----------+----------+--------------+ RIGHT    CompressibilityPhasicitySpontaneityPropertiesThrombus Aging +---------+---------------+---------+-----------+----------+--------------+ CFV      Full            Yes      Yes                                 +---------+---------------+---------+-----------+----------+--------------+ SFJ      Full                                                        +---------+---------------+---------+-----------+----------+--------------+ FV Prox  Full           Yes      Yes                                 +---------+---------------+---------+-----------+----------+--------------+ FV Mid   Full           Yes      Yes                                 +---------+---------------+---------+-----------+----------+--------------+  FV DistalFull           Yes      Yes                                 +---------+---------------+---------+-----------+----------+--------------+ PFV      Full                                                        +---------+---------------+---------+-----------+----------+--------------+ POP      Full           Yes      Yes                                 +---------+---------------+---------+-----------+----------+--------------+ PTV      Full                                                        +---------+---------------+---------+-----------+----------+--------------+ PERO     None           No       No                   Acute          +---------+---------------+---------+-----------+----------+--------------+   +---------+---------------+---------+-----------+----------+------------------+ LEFT     CompressibilityPhasicitySpontaneityPropertiesThrombus Aging     +---------+---------------+---------+-----------+----------+------------------+ CFV      Full           Yes      Yes                                     +---------+---------------+---------+-----------+----------+------------------+ SFJ      Full                                                            +---------+---------------+---------+-----------+----------+------------------+ FV Prox  Full           Yes       Yes                                     +---------+---------------+---------+-----------+----------+------------------+ FV Mid   Full           Yes      Yes                                     +---------+---------------+---------+-----------+----------+------------------+ FV DistalFull           Yes      Yes                                     +---------+---------------+---------+-----------+----------+------------------+  PFV      Full                                                            +---------+---------------+---------+-----------+----------+------------------+ POP      Full           Yes      Yes                                     +---------+---------------+---------+-----------+----------+------------------+ PTV      None           No       No                   Acute - one of                                                           paired             +---------+---------------+---------+-----------+----------+------------------+ PERO     None           No       No                   Acute              +---------+---------------+---------+-----------+----------+------------------+ Soleal   None           No       No                   Acute              +---------+---------------+---------+-----------+----------+------------------+ Gastroc  None           No       No                   Acute              +---------+---------------+---------+-----------+----------+------------------+     Summary: RIGHT: - Findings consistent with acute deep vein thrombosis involving the right peroneal veins.  - There is no evidence of deep vein thrombosis in the lower extremity.  - No cystic structure found in the popliteal fossa.  LEFT: - Findings consistent with acute deep vein thrombosis involving the left peroneal veins, and left posterior tibial veins. Findings consistent with acute intramuscular thrombosis involving the left soleal veins, and left  gastrocnemius veins. - A cystic structure is found in the popliteal fossa.  *See table(s) above for measurements and observations. Electronically signed by Lonni Gaskins MD on 07/19/2024 at 1:43:23 PM.    Final    CT CHEST ABDOMEN PELVIS W CONTRAST Result Date: 07/18/2024 EXAM: CT CHEST WITH CONTRAST 07/18/2024 08:42:25 PM TECHNIQUE: CT of the chest was performed with the administration of 100 mL iohexol  (OMNIPAQUE ) 300 MG/ML solution. Multiplanar reformatted images are provided for review. Automated exposure control, iterative reconstruction, and/or weight based adjustment of the mA/kV was utilized to reduce the radiation dose to as low as reasonably achievable. COMPARISON: 07/16/2023 and 10/07/2022. CLINICAL  HISTORY: Sepsis, Leukocytosis, generalized weakness. FINDINGS: MEDIASTINUM: Heart: Extensive multivessel coronary artery calcifications. No CT evidence of right heart strain, however. Pericardium is unremarkable. The central airways are clear. Vessels: Intraluminal filling defect is identified within the right lower lobar pulmonary artery branching into several segmental pulmonary arteries in keeping with an acute pulmonary embolus. The central pulmonary arteries are enlarged in keeping with changes of pulmonary arterial hypertension. Mild atherosclerotic calcification within the thoracic aorta. No aortic aneurysm. LYMPH NODES: No mediastinal, hilar or axillary lymphadenopathy. LUNGS AND PLEURA: Small left basilar atelectasis. No focal consolidation or pulmonary edema. No pleural effusion or pneumothorax. SOFT TISSUES/BONES: Osseous structures are age appropriate. No acute bone abnormality. No lytic or blastic bone lesion. No acute abnormality of the soft tissues. UPPER ABDOMEN: Limited images of the upper abdomen demonstrate a small hiatal hernia. Cholelithiasis is present. The gallbladder is distended. No superimposed pericholecystic inflammatory changes are identified, however, to suggest changes of  acute cholecystitis. Circumferential rectal wall thickening and mild presacral edema may reflect changes of an infectious or inflammatory proctitis. No evidence of obstruction or perforation. Mild prostatic hypertrophy. Moderate descending and sigmoid colonic diverticulosis without superimposed acute inflammatory change. The appendix is normal. The stomach, small bowel, and large bowel are otherwise unremarkable. Mild aortoiliac atherosclerotic calcification. IMPRESSION: 1. Acute pulmonary embolus in the right lower lobar pulmonary artery extending into segmental branches, without CT evidence of right heart strain. 2. Enlarged central pulmonary arteries, compatible with pulmonary arterial hypertension. 3. Circumferential rectal wall thickening and mild presacral edema, which may reflect infectious or inflammatory proctitis, without obstruction or perforation. 4. Cholelithiasis with gallbladder distention, without CT evidence of acute cholecystitis. 5. Extensive multivessel coronary artery calcifications. 6. Moderate descending and sigmoid colonic diverticulosis, without acute diverticulitis. 7. Small hiatal hernia. 8. RAF score aortic atherosclerosis (ICD10-I70.0). Electronically signed by: Dorethia Molt MD 07/18/2024 08:51 PM EST RP Workstation: [id number]   DG Chest Port 1 View Result Date: 07/18/2024 CLINICAL DATA:  Abnormal labs elevated white count EXAM: PORTABLE CHEST 1 VIEW COMPARISON:  10/04/2022 FINDINGS: Hypoventilatory changes with bronchovascular crowding. Cardiomegaly. Bibasilar atelectasis. No pneumothorax IMPRESSION: Hypoventilatory changes with bronchovascular crowding and bibasilar atelectasis. Cardiomegaly. Electronically Signed   By: Luke Bun M.D.   On: 07/18/2024 18:21        Scheduled Meds:  brexpiprazole   1 mg Oral Daily   brimonidine   1 drop Both Eyes TID   donepezil   10 mg Oral QHS   dutasteride   0.5 mg Oral Daily   Gerhardt's butt cream   Topical BID   insulin  aspart   0-6 Units Subcutaneous TID WC   irbesartan   75 mg Oral Daily   magnesium  hydroxide  30 mL Oral Once   memantine   10 mg Oral BID   mouth rinse  15 mL Mouth Rinse 4 times per day   sertraline   100 mg Oral Daily   sodium chloride  flush  3 mL Intravenous Q12H   Continuous Infusions:  cefTRIAXone  (ROCEPHIN )  IV 2 g (07/20/24 0825)   heparin  2,000 Units/hr (07/20/24 0935)     LOS: 2 days    Time spent: 40 minutes    Toribio Hummer, MD [facility]   To contact the attending provider between 7A-7P or the covering provider during after hours 7P-7A, please log into the web site [email or web address] and access using universal [place] password for that web site. If you do not have the password, please call the hospital operator.  07/20/2024, 1:17 PM    "

## 2024-07-20 NOTE — Progress Notes (Signed)
 PHARMACY - ANTICOAGULATION CONSULT NOTE  Pharmacy Consult for heparin   Indication: acute  pulmonary embolus and DVT  Allergies[1]  Patient Measurements: Height: 6' (182.9 cm) Weight: 115.7 kg (255 lb) IBW/kg (Calculated) : 77.6 HEPARIN  DW (KG): 102.6  Vital Signs:    Labs: Recent Labs    07/18/24 1829 07/19/24 0540 07/19/24 0740 07/19/24 1751 07/20/24 0642 07/20/24 1739  HGB 12.8* 11.2*  --   --  11.2*  --   HCT 38.9* 33.5*  --   --  33.0*  --   PLT 148* 135*  --   --  137*  --   LABPROT 16.9*  --   --   --   --   --   INR 1.3*  --   --   --   --   --   HEPARINUNFRC  --   --    < > 0.34 0.28* 0.23*  CREATININE 0.66 0.52*  --   --  0.46*  --    < > = values in this interval not displayed.    Estimated Creatinine Clearance: 96.7 mL/min (A) (by C-G formula based on SCr of 0.46 mg/dL (L)).   Medical History: Past Medical History:  Diagnosis Date   Aneurysm, ascending aorta    Arthritis    Coronary artery disease    pt denies   Diabetes (HCC)    type 2   Difficulty urinating    prostate problem   GERD (gastroesophageal reflux disease)    HIATAL HERNIA REPAIRED -with lap band 2 years ago NO LONGER HAS GERD   History of elevated glucose    IN THE PAST - NO PROBLEMS SINCE GASTRIC BANDING WEIGHT LOSS    History of kidney stones    Hyperlipidemia    Hypertension    resolved with lap band 2 years ago   Memory loss    Pneumonia    Pulmonary hypertension (HCC)    per echo report  pt unaware   Sleep apnea    uses cpap setting is 12    Assessment: Patient is an 79 y.o M who presented to the ED on 07/18/24 with generalized weakness and fever.  Chest CT on 07/18/24 showed acute PE in the right lower lobar pulmonary artery extending into segmental branches (no RHS).  LE doppler on 07/19/24 came back positive for bilateral DVT. He is currently on heparin  drip for VTE treatment.  Today, 07/20/2024: -Heparin  level is subtherapeutic at 0.23, and trending down after increase  in heparin  infusion rate to 2000 units/hr -No complications of therapy noted  Goal of Therapy:  Heparin  level 0.3-0.7 units/ml Monitor platelets by anticoagulation protocol: Yes   Plan:  -Bolus heparin  1500 units -Increase heparin  infusion to 2200 units/hr  -Check  8 hr heparin  level -Monitor for s/sx bleeding    Stefano MARLA Bologna, PharmD, BCPS Clinical Pharmacist 07/20/2024 7:40 PM         [1] No Known Allergies

## 2024-07-20 NOTE — Plan of Care (Signed)
   Problem: Safety: Goal: Ability to remain free from injury will improve Outcome: Progressing   Problem: Skin Integrity: Goal: Risk for impaired skin integrity will decrease Outcome: Progressing

## 2024-07-20 NOTE — Progress Notes (Signed)
 PHARMACY - ANTICOAGULATION CONSULT NOTE  Pharmacy Consult for heparin   Indication: acute  pulmonary embolus and DVT  Allergies[1]  Patient Measurements: Height: 6' (182.9 cm) Weight: 115.7 kg (255 lb) IBW/kg (Calculated) : 77.6 HEPARIN  DW (KG): 102.6  Vital Signs: Temp: 98.9 F (37.2 C) (01/15 0441) Temp Source: Oral (01/15 0441) BP: 168/96 (01/15 0441) Pulse Rate: 68 (01/15 0441)  Labs: Recent Labs    07/17/24 1438 07/18/24 1829 07/19/24 0540 07/19/24 0740 07/19/24 1751  HGB 13.5 12.8* 11.2*  --   --   HCT 41.8 38.9* 33.5*  --   --   PLT 161 148* 135*  --   --   APTT 34  --   --   --   --   LABPROT 16.3* 16.9*  --   --   --   INR 1.2 1.3*  --   --   --   HEPARINUNFRC  --   --   --  0.16* 0.34  CREATININE 0.67 0.66 0.52*  --   --     Estimated Creatinine Clearance: 96.7 mL/min (A) (by C-G formula based on SCr of 0.52 mg/dL (L)).   Medical History: Past Medical History:  Diagnosis Date   Aneurysm, ascending aorta    Arthritis    Coronary artery disease    pt denies   Diabetes (HCC)    type 2   Difficulty urinating    prostate problem   GERD (gastroesophageal reflux disease)    HIATAL HERNIA REPAIRED -with lap band 2 years ago NO LONGER HAS GERD   History of elevated glucose    IN THE PAST - NO PROBLEMS SINCE GASTRIC BANDING WEIGHT LOSS    History of kidney stones    Hyperlipidemia    Hypertension    resolved with lap band 2 years ago   Memory loss    Pneumonia    Pulmonary hypertension (HCC)    per echo report  pt unaware   Sleep apnea    uses cpap setting is 12    Assessment: Patient is an 79 y.o M who presented to the ED on 07/18/24 with generalized weakness and fever.  Chest CT on 07/18/24 showed acute PE in the right lower lobar pulmonary artery extending into segmental branches (no RHS).  LE doppler on 07/19/24 came back positive for bilateral DVT. He is currently on heparin  drip for VTE treatment.  Today, 07/20/2024: - heparin  level is  subtherapeutic at 0.28 - per pt's RN, no issues with IV line and no bleeding noted   Goal of Therapy:  Heparin  level 0.3-0.7 units/ml Monitor platelets by anticoagulation protocol: Yes   Plan:  - increase heparin  drip to 2000 units/hr - check  8 hr heparin  level - monitor for s/sx bleeding   Enisa Runyan P 07/20/2024,7:14 AM       [1] No Known Allergies

## 2024-07-20 NOTE — Telephone Encounter (Addendum)
 Patient Product/process Development Scientist completed.    The patient is insured through [facility]. Patient has Medicare and is not eligible for a copay card, but may be able to apply for patient assistance or Medicare RX Payment Plan (Patient Must reach out to their plan, if eligible for payment plan), if available.    Ran test claim for Eliquis  Starter Pack and the current 30 day co-pay is $316.74 due to deductible.  Ran test claim for Xarelto Starter Pack and the current 30 day co-pay is $359.49 due to deductible.  Ran test claim for dabigatran 150 mg and the current 30 day co-pay is $141.71due to deductible.   This test claim was processed through  [facility]- copay amounts may vary at other pharmacies due to pharmacy/plan contracts, or as the patient moves through the different stages of their insurance plan.     Reyes Sharps, CPHT Pharmacy Technician Patient Advocate Specialist Lead [facility] Patient Advocate Team Direct Number: [phone]  Fax: [phone]

## 2024-07-21 DIAGNOSIS — R262 Difficulty in walking, not elsewhere classified: Secondary | ICD-10-CM | POA: Diagnosis not present

## 2024-07-21 DIAGNOSIS — E119 Type 2 diabetes mellitus without complications: Secondary | ICD-10-CM | POA: Diagnosis not present

## 2024-07-21 DIAGNOSIS — F0390 Unspecified dementia without behavioral disturbance: Secondary | ICD-10-CM | POA: Diagnosis not present

## 2024-07-21 DIAGNOSIS — I2694 Multiple subsegmental pulmonary emboli without acute cor pulmonale: Secondary | ICD-10-CM | POA: Diagnosis not present

## 2024-07-21 DIAGNOSIS — I82461 Acute embolism and thrombosis of right calf muscular vein: Secondary | ICD-10-CM | POA: Diagnosis not present

## 2024-07-21 DIAGNOSIS — I10 Essential (primary) hypertension: Secondary | ICD-10-CM | POA: Diagnosis not present

## 2024-07-21 LAB — BASIC METABOLIC PANEL WITH GFR
Anion gap: 7 (ref 5–15)
BUN: 7 mg/dL — ABNORMAL LOW (ref 8–23)
CO2: 28 mmol/L (ref 22–32)
Calcium: 8.4 mg/dL — ABNORMAL LOW (ref 8.9–10.3)
Chloride: 102 mmol/L (ref 98–111)
Creatinine, Ser: 0.45 mg/dL — ABNORMAL LOW (ref 0.61–1.24)
GFR, Estimated: >60 mL/min
Glucose, Bld: 123 mg/dL — ABNORMAL HIGH (ref 70–99)
Potassium: 3.4 mmol/L — ABNORMAL LOW (ref 3.5–5.1)
Sodium: 137 mmol/L (ref 135–145)

## 2024-07-21 LAB — GLUCOSE, CAPILLARY
Glucose-Capillary: 126 mg/dL — ABNORMAL HIGH (ref 70–99)
Glucose-Capillary: 138 mg/dL — ABNORMAL HIGH (ref 70–99)
Glucose-Capillary: 125 mg/dL — ABNORMAL HIGH (ref 70–99)

## 2024-07-21 MED ORDER — APIXABAN 5 MG PO TABS: 5.0000 mg | ORAL_TABLET | Freq: Two times a day (BID) | ORAL | Status: DC

## 2024-07-21 MED ORDER — POTASSIUM CHLORIDE CRYS ER 20 MEQ PO TBCR
40.0000 meq | EXTENDED_RELEASE_TABLET | Freq: Once | ORAL | Status: AC
  Administered 2024-07-21: 40 meq via ORAL
  Filled 2024-07-21: qty 2

## 2024-07-21 MED ORDER — APIXABAN 5 MG PO TABS
10.0000 mg | ORAL_TABLET | Freq: Two times a day (BID) | ORAL | Status: DC
  Administered 2024-07-21 – 2024-07-27 (×13): 10 mg via ORAL
  Filled 2024-07-21 (×13): qty 2

## 2024-07-21 NOTE — Progress Notes (Signed)
 " PROGRESS NOTE    [name] [name]  FMW:[id number] DOB: [birth year] DOA: 07/18/2024 PCP: Clarice Nottingham, MD    Chief Complaint  Patient presents with   Weakness    Brief Narrative:  Patient 79 year old gentleman history of advanced dementia, type 2 diabetes, hypertension presented with severe weakness x 3 days.  Patient noted at baseline with the use of a walker, wife is primary caregiver however patient noted to have been significantly weak with difficulty ambulating over the past couple of days.  Patient noted to be leaning to the right side and he was brought to the ED on 07/17/2024 where MRI of the brain was done which was negative for any acute infarcts.  Patient noted to have a fever of 101 and subsequently brought to the ED.  CT chest abdomen and pelvis was done concerning for right lower lobe pulmonary embolism.  Patient placed on heparin  drip.  Patient also placed empirically on IV antibiotics pending infectious workup.     Assessment & Plan:   Principal Problem:   Pulmonary embolism (HCC) Active Problems:   Dementia without behavioral disturbance (HCC)   Acute deep vein thrombosis (DVT) of calf muscle vein of right lower extremity (HCC)   Ambulatory dysfunction  #1 right lower lobe PE with mild respiratory failure requiring 2 L O2 nasal cannula/acute right lower extremity DVT involving right peroneal vein - Patient noted to have presented with worsening weakness, difficulty ambulating, noted to have a fever. - CT chest abdomen and pelvis done was consistent with acute PE in the right lower lobe extending to segmental branches without CT evidence of right heart strain. - Lower extremity Dopplers done with an acute right lower extremity DVT involving the right peroneal vein. - 2D echo ordered and done with a EF of 50 to 55%,NWMA, negative for right ventricular strain. - Currently on IV heparin  and will transition to Eliquis  today.    -Patient noted to have been pancultured due to  concern for fevers with urine cultures with no growth to date, blood cultures pending with no growth to date. - Patient has received 3 days of IV Rocephin , will discontinue IV antibiotics and monitor off antibiotics. - PT/OT.   2.  Advanced dementia - Continue home regimen Aricept , Namenda , Rexulti .   3.  Hypertension - Continue Avapro .    4.  Ambulatory dysfunction -Per admitting physician it is noted that patient's wife would like him to return home but needs to be able to ambulate with a walker prior to that as wife unable to lift patient. - PT/OT assessed patient and recommended SNF placement   5.  Well-controlled diabetes mellitus type 2 -Hemoglobin A1c 5.6. - CBG noted at 126 this morning. - Per med rec patient noted to be on Ozempic . - Discontinue SSI.  6.  Hypokalemia -Replete.      DVT prophylaxis: Heparin  drip>>> Eliquis  Code Status: DNR Family Communication: Updated wife, son at bedside. Disposition: SNF  Status is: Inpatient Remains inpatient appropriate because: Severity of illness   Consultants:  None  Procedures:  CT chest abdomen pelvis 07/18/2024 Chest x-ray 07/18/2024 2D echo 07/19/2024 Lower extremity Dopplers 07/19/2024  Antimicrobials:  Anti-infectives (From admission, onward)    Start     Dose/Rate Route Frequency Ordered Stop   07/19/24 0800  cefTRIAXone  (ROCEPHIN ) 2 g in sodium chloride  0.9 % 100 mL IVPB  Status:  Discontinued        2 g 200 mL/hr over 30 Minutes Intravenous Every 24 hours 07/19/24 0043  07/21/24 0743   07/18/24 1930  azithromycin  (ZITHROMAX ) tablet 500 mg  Status:  Discontinued        500 mg Oral Daily 07/18/24 1916 07/19/24 0043   07/18/24 1915  cefTRIAXone  (ROCEPHIN ) 1 g in sodium chloride  0.9 % 100 mL IVPB        1 g 200 mL/hr over 30 Minutes Intravenous  Once 07/18/24 1906 07/18/24 2117   07/18/24 1915  doxycycline (VIBRAMYCIN) 100 mg in sodium chloride  0.9 % 250 mL IVPB  Status:  Discontinued        100 mg 125 mL/hr  over 120 Minutes Intravenous Every 12 hours 07/18/24 1906 07/18/24 1916         Subjective: Patient laying in bed.  More talkative per wife this morning and looks better than he did on admission.  Patient pleasantly confused.  Patient denies any chest pain or shortness of breath.  No abdominal pain.  No bleeding.  Wife and son at bedside.   Objective: Vitals:   07/20/24 0441 07/20/24 2106 07/21/24 0122 07/21/24 0407  BP: (!) 168/96 (!) 165/92  (!) 141/78  Pulse: 68 73  71  Resp: 14 14  14   Temp: 98.9 F (37.2 C) 98.7 F (37.1 C)  98.8 F (37.1 C)  TempSrc: Oral Oral  Oral  SpO2: 99% 99% 93% 95%  Weight:      Height:        Intake/Output Summary (Last 24 hours) at 07/21/2024 1124 Last data filed at 07/21/2024 0700 Gross per 24 hour  Intake 1016.15 ml  Output 500 ml  Net 516.15 ml   Filed Weights   07/19/24 0852  Weight: 115.7 kg    Examination:  General exam: NAD Respiratory system: Lungs clear to auscultation bilaterally anterior lung fields.  No wheezes, no crackles, no rhonchi.  Fair air movement.  Speaking in full sentences.   Cardiovascular system: RRR no murmurs rubs or gallops.  No JVD.  No lower extremity edema.  Gastrointestinal system: Abdomen is soft, nontender, nondistended, positive bowel sounds.  No rebound.  No guarding.   Central nervous system: Alert.  Moving extremities spontaneously.  No focal neurological deficits. Extremities: Symmetric 5 x 5 power. Skin: No rashes, lesions or ulcers Psychiatry: Judgement and insight appear poor to fair.. Mood & affect appropriate.     Data Reviewed: I have personally reviewed following labs and imaging studies  CBC: Recent Labs  Lab 07/17/24 1438 07/18/24 1829 07/19/24 0540 07/20/24 0642  WBC 11.2* 12.5* 10.4 10.5  NEUTROABS 7.9* 9.8*  --   --   HGB 13.5 12.8* 11.2* 11.2*  HCT 41.8 38.9* 33.5* 33.0*  MCV 98.6 98.2 96.8 97.6  PLT 161 148* 135* 137*    Basic Metabolic Panel: Recent Labs  Lab  07/17/24 1438 07/18/24 1829 07/19/24 0540 07/20/24 0642 07/21/24 0706  NA 139 141 142 138 137  K 3.6 3.7 3.3* 3.7 3.4*  CL 102 103 106 107 102  CO2 26 27 27 23 28   GLUCOSE 131* 159* 126* 116* 123*  BUN 22 19 13 11  7*  CREATININE 0.67 0.66 0.52* 0.46* 0.45*  CALCIUM  9.4 9.2 8.6* 8.5* 8.4*  MG  --   --  2.0 2.1  --   PHOS  --   --   --  2.6  --     GFR: Estimated Creatinine Clearance: 96.7 mL/min (A) (by C-G formula based on SCr of 0.45 mg/dL (L)).  Liver Function Tests: Recent Labs  Lab 07/17/24 1438 07/18/24  1829 07/20/24 0642  AST 22 18  --   ALT 10 12  --   ALKPHOS 79 75  --   BILITOT 1.0 0.8  --   PROT 7.5 7.0  --   ALBUMIN 4.1 3.9 3.0*    CBG: Recent Labs  Lab 07/20/24 0750 07/20/24 1202 07/20/24 1728 07/20/24 2108 07/21/24 0743  GLUCAP 125* 123* 144* 110* 126*     Recent Results (from the past 240 hours)  Resp panel by RT-PCR (RSV, Flu A&B, Covid) Anterior Nasal Swab     Status: None   Collection Time: 07/18/24  6:05 PM   Specimen: Anterior Nasal Swab  Result Value Ref Range Status   SARS Coronavirus 2 by RT PCR NEGATIVE NEGATIVE Final    Comment: (NOTE) SARS-CoV-2 target nucleic acids are NOT DETECTED.  The SARS-CoV-2 RNA is generally detectable in upper respiratory specimens during the acute phase of infection. The lowest concentration of SARS-CoV-2 viral copies this assay can detect is 138 copies/mL. A negative result does not preclude SARS-Cov-2 infection and should not be used as the sole basis for treatment or other patient management decisions. A negative result may occur with  improper specimen collection/handling, submission of specimen other than nasopharyngeal swab, presence of viral mutation(s) within the areas targeted by this assay, and inadequate number of viral copies(<138 copies/mL). A negative result must be combined with clinical observations, patient history, and epidemiological information. The expected result is  Negative.  Fact Sheet for Patients:  bloggercourse.com  Fact Sheet for Healthcare Providers:  seriousbroker.it  This test is no t yet approved or cleared by the [place]  FDA and  has been authorized for detection and/or diagnosis of SARS-CoV-2 by FDA under an Emergency Use Authorization (EUA). This EUA will remain  in effect (meaning this test can be used) for the duration of the COVID-19 declaration under Section 564(b)(1) of the Act, 21 U.S.C.section 360bbb-3(b)(1), unless the authorization is terminated  or revoked sooner.       Influenza A by PCR NEGATIVE NEGATIVE Final   Influenza B by PCR NEGATIVE NEGATIVE Final    Comment: (NOTE) The Xpert Xpress SARS-CoV-2/FLU/RSV plus assay is intended as an aid in the diagnosis of influenza from Nasopharyngeal swab specimens and should not be used as a sole basis for treatment. Nasal washings and aspirates are unacceptable for Xpert Xpress SARS-CoV-2/FLU/RSV testing.  Fact Sheet for Patients: bloggercourse.com  Fact Sheet for Healthcare Providers: seriousbroker.it  This test is not yet approved or cleared by the [place]  FDA and has been authorized for detection and/or diagnosis of SARS-CoV-2 by FDA under an Emergency Use Authorization (EUA). This EUA will remain in effect (meaning this test can be used) for the duration of the COVID-19 declaration under Section 564(b)(1) of the Act, 21 U.S.C. section 360bbb-3(b)(1), unless the authorization is terminated or revoked.     Resp Syncytial Virus by PCR NEGATIVE NEGATIVE Final    Comment: (NOTE) Fact Sheet for Patients: bloggercourse.com  Fact Sheet for Healthcare Providers: seriousbroker.it  This test is not yet approved or cleared by the [place]  FDA and has been authorized for detection and/or diagnosis of  SARS-CoV-2 by FDA under an Emergency Use Authorization (EUA). This EUA will remain in effect (meaning this test can be used) for the duration of the COVID-19 declaration under Section 564(b)(1) of the Act, 21 U.S.C. section 360bbb-3(b)(1), unless the authorization is terminated or revoked.  Performed at [facility], [address] [place], [place] [address]  Blood Culture (routine x 2)     Status: None (Preliminary result)   Collection Time: 07/18/24  6:11 PM   Specimen: Right Antecubital; Blood  Result Value Ref Range Status   Specimen Description   Final    RIGHT ANTECUBITAL Performed at [facility], [address], [place], [place] [address]    Special Requests   Final    BOTTLES DRAWN AEROBIC AND ANAEROBIC Blood Culture adequate volume Performed at [facility], [address], [place], [place] [address]    Culture   Final    NO GROWTH 3 DAYS Performed at [facility], [address] [place], [place] [address]    Report Status PENDING  Incomplete  Urine Culture     Status: None   Collection Time: 07/19/24  2:01 AM   Specimen: Urine, Random  Result Value Ref Range Status   Specimen Description   Final    URINE, RANDOM Performed at [facility], [address], [place], [place] [address]    Special Requests   Final    NONE Reflexed from [phone] Performed at [facility], [address] [place], [place] [address]    Culture   Final    NO GROWTH Performed at [facility], [address], [place], [place] [address]    Report Status 07/20/2024 FINAL  Final  Blood Culture (routine x 2)     Status: None (Preliminary result)   Collection Time: 07/19/24  5:51 PM   Specimen: BLOOD  Result Value Ref Range Status   Specimen Description   Final    BLOOD BLOOD RIGHT HAND Performed at [facility], [address] [place], [place] [address]    Special Requests    Final    BOTTLES DRAWN AEROBIC AND ANAEROBIC Blood Culture adequate volume Performed at [facility], [address] [place], [place] [address]    Culture   Final    NO GROWTH 2 DAYS Performed at [facility], [address] [place], [place] [address]    Report Status PENDING  Incomplete         Radiology Studies: ECHOCARDIOGRAM COMPLETE Result Date: 07/19/2024    ECHOCARDIOGRAM REPORT   Patient Name:   [name] [name] Date of Exam: 07/19/2024 Medical Rec #:  [id number]     Height:       72.0 in Accession #:    [id number]    Weight:       255.1 lb Date of Birth:  12/24/1945    BSA:          2.364 m Patient Age:    78 years      BP:           140/91 mmHg Patient Gender: M             HR:           69 bpm. Exam Location:  Inpatient Procedure: 2D Echo, 3D Echo, Cardiac Doppler, Color Doppler and Strain Analysis            (Both Spectral and Color Flow Doppler were utilized during            procedure). Indications:    Pulmonary Embolus  History:        Patient has prior history of Echocardiogram examinations, most                 recent 03/30/2024.  CAD, Pulmonary HTN; Risk Factors:Hypertension,                 Diabetes and Sleep Apnea. Aortic Root Dilation with Ascending                 Aortic Aneurysm.  Sonographer:    Philomena Daring Referring Phys: [id number] Aidynn Krenn V Jamekia Gannett  Sonographer Comments: Global longitudinal strain was attempted. IMPRESSIONS  1. Left ventricular ejection fraction, by estimation, is 50 to 55%. The left ventricle has low normal function. The left ventricle has no regional wall motion abnormalities. Left ventricular diastolic parameters were normal.  2. Right ventricular systolic function is normal. The right ventricular size is normal.  3. Left atrial size was mildly dilated.  4. The mitral valve is normal in structure. Trivial mitral valve regurgitation. No evidence of mitral stenosis.  5. The aortic valve is tricuspid. Aortic valve regurgitation is not  visualized. No aortic stenosis is present.  6. Aortic dilatation noted. There is moderate dilatation of the aortic root, measuring 46 mm. FINDINGS  Left Ventricle: Left ventricular ejection fraction, by estimation, is 50 to 55%. The left ventricle has low normal function. The left ventricle has no regional wall motion abnormalities. Strain was performed and the global longitudinal strain is indeterminate. The left ventricular internal cavity size was normal in size. There is no left ventricular hypertrophy. Left ventricular diastolic parameters were normal. Right Ventricle: The right ventricular size is normal. Right ventricular systolic function is normal. Left Atrium: Left atrial size was mildly dilated. Right Atrium: Right atrial size was normal in size. Pericardium: There is no evidence of pericardial effusion. Mitral Valve: The mitral valve is normal in structure. Mild mitral annular calcification. Trivial mitral valve regurgitation. No evidence of mitral valve stenosis. Tricuspid Valve: The tricuspid valve is normal in structure. Tricuspid valve regurgitation is trivial. No evidence of tricuspid stenosis. Aortic Valve: The aortic valve is tricuspid. Aortic valve regurgitation is not visualized. No aortic stenosis is present. Pulmonic Valve: The pulmonic valve was normal in structure. Pulmonic valve regurgitation is not visualized. No evidence of pulmonic stenosis. Aorta: Aortic dilatation noted. There is moderate dilatation of the aortic root, measuring 46 mm. Venous: The inferior vena cava was not well visualized. IAS/Shunts: The interatrial septum was not well visualized.  LEFT VENTRICLE PLAX 2D LVIDd:         4.60 cm   Diastology LVIDs:         3.00 cm   LV e' medial:    8.38 cm/s LV PW:         1.00 cm   LV E/e' medial:  8.7 LV IVS:        1.00 cm   LV e' lateral:   8.70 cm/s LVOT diam:     2.30 cm   LV E/e' lateral: 8.4 LV SV:         94 LV SV Index:   40 LVOT Area:     4.15 cm LV IVRT:       100 msec                           3D Volume EF:                          3D EF:        60 %  LV EDV:       200 ml                          LV ESV:       80 ml                          LV SV:        120 ml RIGHT VENTRICLE RV Basal diam:  3.10 cm RV Mid diam:    2.70 cm RV S prime:     9.14 cm/s TAPSE (M-mode): 2.2 cm LEFT ATRIUM             Index        RIGHT ATRIUM           Index LA diam:        4.20 cm 1.78 cm/m   RA Area:     19.30 cm LA Vol (A2C):   92.3 ml 39.05 ml/m  RA Volume:   49.90 ml  21.11 ml/m LA Vol (A4C):   85.3 ml 36.09 ml/m LA Biplane Vol: 90.3 ml 38.21 ml/m  AORTIC VALVE LVOT Vmax:   111.00 cm/s LVOT Vmean:  71.600 cm/s LVOT VTI:    0.226 m  AORTA Ao Root diam: 3.80 cm Ao Asc diam:  3.80 cm MITRAL VALVE MV Area (PHT): 4.12 cm    SHUNTS MV Decel Time: 184 msec    Systemic VTI:  0.23 m MV E velocity: 72.80 cm/s  Systemic Diam: 2.30 cm MV A velocity: 74.70 cm/s MV E/A ratio:  0.97 Redell Shallow MD Electronically signed by Redell Shallow MD Signature Date/Time: 07/19/2024/3:29:51 PM    Final    VAS US  LOWER EXTREMITY VENOUS (DVT) Result Date: 07/19/2024  Lower Venous DVT Study Patient Name:  [name] [name]  Date of Exam:   07/19/2024 Medical Rec #: [id number]      Accession #:    [id number] Date of Birth: May 16, 1946     Patient Gender: M Patient Age:   36 years Exam Location:  [facility] Procedure:      VAS US  LOWER EXTREMITY VENOUS (DVT) Referring Phys: TORIBIO HUMMER --------------------------------------------------------------------------------  Indications: Pulmonary embolism.  Comparison Study: No previous exams Performing Technologist: Jody Hill RVT, RDMS  Examination Guidelines: A complete evaluation includes B-mode imaging, spectral Doppler, color Doppler, and power Doppler as needed of all accessible portions of each vessel. Bilateral testing is considered an integral part of a complete examination. Limited examinations for reoccurring indications may be  performed as noted. The reflux portion of the exam is performed with the patient in reverse Trendelenburg.  +---------+---------------+---------+-----------+----------+--------------+ RIGHT    CompressibilityPhasicitySpontaneityPropertiesThrombus Aging +---------+---------------+---------+-----------+----------+--------------+ CFV      Full           Yes      Yes                                 +---------+---------------+---------+-----------+----------+--------------+ SFJ      Full                                                        +---------+---------------+---------+-----------+----------+--------------+ FV Prox  Full  Yes      Yes                                 +---------+---------------+---------+-----------+----------+--------------+ FV Mid   Full           Yes      Yes                                 +---------+---------------+---------+-----------+----------+--------------+ FV DistalFull           Yes      Yes                                 +---------+---------------+---------+-----------+----------+--------------+ PFV      Full                                                        +---------+---------------+---------+-----------+----------+--------------+ POP      Full           Yes      Yes                                 +---------+---------------+---------+-----------+----------+--------------+ PTV      Full                                                        +---------+---------------+---------+-----------+----------+--------------+ PERO     None           No       No                   Acute          +---------+---------------+---------+-----------+----------+--------------+   +---------+---------------+---------+-----------+----------+------------------+ LEFT     CompressibilityPhasicitySpontaneityPropertiesThrombus Aging     +---------+---------------+---------+-----------+----------+------------------+  CFV      Full           Yes      Yes                                     +---------+---------------+---------+-----------+----------+------------------+ SFJ      Full                                                            +---------+---------------+---------+-----------+----------+------------------+ FV Prox  Full           Yes      Yes                                     +---------+---------------+---------+-----------+----------+------------------+ FV Mid   Full  Yes      Yes                                     +---------+---------------+---------+-----------+----------+------------------+ FV DistalFull           Yes      Yes                                     +---------+---------------+---------+-----------+----------+------------------+ PFV      Full                                                            +---------+---------------+---------+-----------+----------+------------------+ POP      Full           Yes      Yes                                     +---------+---------------+---------+-----------+----------+------------------+ PTV      None           No       No                   Acute - one of                                                           paired             +---------+---------------+---------+-----------+----------+------------------+ PERO     None           No       No                   Acute              +---------+---------------+---------+-----------+----------+------------------+ Soleal   None           No       No                   Acute              +---------+---------------+---------+-----------+----------+------------------+ Gastroc  None           No       No                   Acute              +---------+---------------+---------+-----------+----------+------------------+     Summary: RIGHT: - Findings consistent with acute deep vein thrombosis involving the right peroneal veins.   - There is no evidence of deep vein thrombosis in the lower extremity.  - No cystic structure found in the popliteal fossa.  LEFT: - Findings consistent with acute deep vein thrombosis involving the left peroneal veins, and left posterior tibial veins. Findings consistent with acute intramuscular thrombosis involving the left soleal veins, and left gastrocnemius veins. - A cystic structure is found in the  popliteal fossa.  *See table(s) above for measurements and observations. Electronically signed by Lonni Gaskins MD on 07/19/2024 at 1:43:23 PM.    Final         Scheduled Meds:  apixaban   10 mg Oral BID   Followed by   NOREEN ON 07/28/2024] apixaban   5 mg Oral BID   brexpiprazole   1 mg Oral Daily   brimonidine   1 drop Both Eyes TID   donepezil   10 mg Oral QHS   dutasteride   0.5 mg Oral Daily   Gerhardt's butt cream   Topical BID   insulin  aspart  0-6 Units Subcutaneous TID WC   irbesartan   75 mg Oral Daily   magnesium  hydroxide  30 mL Oral Once   memantine   10 mg Oral BID   mouth rinse  15 mL Mouth Rinse 4 times per day   potassium chloride   40 mEq Oral Once   sertraline   100 mg Oral Daily   sodium chloride  flush  3 mL Intravenous Q12H   Continuous Infusions:     LOS: 3 days    Time spent: 40 minutes    Toribio Hummer, MD [facility]   To contact the attending provider between 7A-7P or the covering provider during after hours 7P-7A, please log into the web site [email or web address] and access using universal [place] password for that web site. If you do not have the password, please call the hospital operator.  07/21/2024, 11:24 AM    "

## 2024-07-21 NOTE — Progress Notes (Signed)
 SLP Cancellation Note  Patient Details Name: [name] [name] MRN: [id number] DOB: 1945-07-22   Cancelled treatment:       Reason Eval/Treat Not Completed: SLP screened. Given negative MRI and h/o dementia, will defer cognitive-linguistic interventions to pt's next level of care. Signing off acutely.    Damien Blumenthal, M.A., CCC-SLP Speech Language Pathology, Acute Rehabilitation Services  Secure Chat preferred [phone]  07/21/2024, 7:28 AM

## 2024-07-21 NOTE — [place] FL2 (Signed)
 " [place]  MEDICAID FL2 LEVEL OF CARE FORM     IDENTIFICATION  Patient Name: [name] [name] Birthdate: 10/02/1945 Sex: male Admission Date (Current Location): 07/18/2024  [facility] and [place] Number:  Producer, Television/film/video and Address:  [facility],  [address] [place], [place] [address]      Provider Number: [id number]  Attending Physician Name and Address:  Sebastian Toribio GAILS, MD  Relative Name and Phone Number:  Darrold Bezek [phone]    Current Level of Care: SNF Recommended Level of Care: Skilled Nursing Facility Prior Approval Number:    Date Approved/Denied:   PASRR Number: PASRR pending  Discharge Plan: SNF    Current Diagnoses: Patient Active Problem List   Diagnosis Date Noted   Dementia without behavioral disturbance (HCC) 07/19/2024   Acute deep vein thrombosis (DVT) of calf muscle vein of right lower extremity (HCC) 07/19/2024   Ambulatory dysfunction 07/19/2024   Pulmonary embolism (HCC) 07/18/2024   Aortic dilatation    Nonrheumatic mitral valve regurgitation    Aortic root dilation 07/25/2018   Aneurysm, ascending aorta 07/25/2018   Essential hypertension 07/25/2018   DM2 (diabetes mellitus, type 2) (HCC) 07/25/2018   Spinal stenosis of lumbar region with neurogenic claudication 07/25/2018   OSA on CPAP 07/25/2018   Hemorrhoids 07/26/2013   Left Ureteral stone 07/18/2011   Pyelonephritis 07/18/2011   Anal fissure 06/17/2011   History of laparoscopic adjustable gastric banding 06/17/2011    Orientation RESPIRATION BLADDER Height & Weight     Self  Normal Incontinent Weight: 115.7 kg Height:  6' (182.9 cm)  BEHAVIORAL SYMPTOMS/MOOD NEUROLOGICAL BOWEL NUTRITION STATUS     (n/a) Incontinent Diet (Regular diet)  AMBULATORY STATUS COMMUNICATION OF NEEDS Skin   Total Care Verbally Other (Comment) (redness noted to bilateral buttocks)                       Personal Care Assistance Level of Assistance  Bathing, Feeding, Dressing  Bathing Assistance: Maximum assistance Feeding assistance: Limited assistance Dressing Assistance: Maximum assistance     Functional Limitations Info  Sight, Hearing, Speech Sight Info: Adequate Hearing Info: Impaired Speech Info: Adequate    SPECIAL CARE FACTORS FREQUENCY  PT (By licensed PT), OT (By licensed OT)     PT Frequency: 5x/wk OT Frequency: 5x/wk            Contractures Contractures Info: Not present    Additional Factors Info  Code Status, Allergies, Psychotropic, Insulin  Sliding Scale, Isolation Precautions, Suctioning Needs Code Status Info: DNR Allergies Info: No known drug allergies Psychotropic Info: see d/c summary Insulin  Sliding Scale Info: see d/c summary Isolation Precautions Info: n/a Suctioning Needs: n/a   Current Medications (07/21/2024):  This is the current hospital active medication list Current Facility-Administered Medications  Medication Dose Route Frequency Provider Last Rate Last Admin   acetaminophen  (TYLENOL ) tablet 650 mg  650 mg Oral Q6H PRN Arthea Child, MD   650 mg at 07/20/24 1625   Or   acetaminophen  (TYLENOL ) suppository 650 mg  650 mg Rectal Q6H PRN Arthea Child, MD       apixaban  (ELIQUIS ) tablet 10 mg  10 mg Oral BID Sebastian Toribio GAILS, MD   10 mg at 07/21/24 9177   Followed by   NOREEN ON 07/28/2024] apixaban  (ELIQUIS ) tablet 5 mg  5 mg Oral BID Sebastian Toribio GAILS, MD       bisacodyl  (DULCOLAX) EC tablet 5 mg  5 mg Oral Daily PRN Arthea Child,  MD       brexpiprazole  (REXULTI ) tablet 1 mg  1 mg Oral Daily Sebastian Toribio GAILS, MD   1 mg at 07/21/24 9177   brimonidine  (ALPHAGAN ) 0.2 % ophthalmic solution 1 drop  1 drop Both Eyes TID Sebastian Toribio GAILS, MD   1 drop at 07/21/24 9177   donepezil  (ARICEPT ) tablet 10 mg  10 mg Oral QHS Claiborne, Claudia, MD   10 mg at 07/21/24 9177   dutasteride  (AVODART ) capsule 0.5 mg  0.5 mg Oral Daily Claiborne, Claudia, MD   0.5 mg at 07/21/24 9177   Gerhardt's butt cream    Topical BID Sebastian Toribio GAILS, MD   Given at 07/21/24 9177   insulin  aspart (novoLOG ) injection 0-6 Units  0-6 Units Subcutaneous TID WC Claiborne, Claudia, MD       irbesartan  (AVAPRO ) tablet 75 mg  75 mg Oral Daily Claiborne, Claudia, MD   75 mg at 07/21/24 9177   magnesium  hydroxide (MILK OF MAGNESIA) suspension 30 mL  30 mL Oral Once Arthea Child, MD       memantine  (NAMENDA ) tablet 10 mg  10 mg Oral BID Claiborne, Claudia, MD   10 mg at 07/21/24 9177   ondansetron  (ZOFRAN ) tablet 4 mg  4 mg Oral Q6H PRN Arthea Child, MD       Or   ondansetron  (ZOFRAN ) injection 4 mg  4 mg Intravenous Q6H PRN Claiborne, Claudia, MD       Oral care mouth rinse  15 mL Mouth Rinse 4 times per day Sebastian Toribio GAILS, MD   15 mL at 07/21/24 1256   Oral care mouth rinse  15 mL Mouth Rinse PRN Sebastian Toribio GAILS, MD       sertraline  (ZOLOFT ) tablet 100 mg  100 mg Oral Daily Claiborne, Claudia, MD   100 mg at 07/21/24 9177   sodium chloride  flush (NS) 0.9 % injection 3 mL  3 mL Intravenous Q12H Arthea Child, MD   3 mL at 07/21/24 9176     Discharge Medications: Please see discharge summary for a list of discharge medications.  Relevant Imaging Results:  Relevant Lab Results:   Additional Information SS# [id number]  Toy LITTIE Agar, RN     "

## 2024-07-21 NOTE — Discharge Instructions (Signed)
 Information on my medicine - ELIQUIS  (apixaban )  This medication education was reviewed with me or my healthcare representative as part of my discharge preparation.  The pharmacist that spoke with me during my hospital stay was:  Osie Iantha SQUIBB, [facility]  Why was Eliquis  prescribed for you? Eliquis  was prescribed to treat blood clots that may have been found in the veins of your legs (deep vein thrombosis) or in your lungs (pulmonary embolism) and to reduce the risk of them occurring again.  What do You need to know about Eliquis  ? The starting dose is 10 mg (two 5 mg tablets) taken TWICE daily for the FIRST SEVEN (7) DAYS, then on 07/28/24  the dose is reduced to ONE 5 mg tablet taken TWICE daily.  Eliquis  may be taken with or without food.   Try to take the dose about the same time in the morning and in the evening. If you have difficulty swallowing the tablet whole please discuss with your pharmacist how to take the medication safely.  Take Eliquis  exactly as prescribed and DO NOT stop taking Eliquis  without talking to the doctor who prescribed the medication.  Stopping may increase your risk of developing a new blood clot.  Refill your prescription before you run out.  After discharge, you should have regular check-up appointments with your healthcare provider that is prescribing your Eliquis .    What do you do if you miss a dose? If a dose of ELIQUIS  is not taken at the scheduled time, take it as soon as possible on the same day and twice-daily administration should be resumed. The dose should not be doubled to make up for a missed dose.  Important Safety Information A possible side effect of Eliquis  is bleeding. You should call your healthcare provider right away if you experience any of the following: Bleeding from an injury or your nose that does not stop. Unusual colored urine (red or dark brown) or unusual colored stools (red or black). Unusual bruising for unknown reasons. A  serious fall or if you hit your head (even if there is no bleeding).  Some medicines may interact with Eliquis  and might increase your risk of bleeding or clotting while on Eliquis . To help avoid this, consult your healthcare provider or pharmacist prior to using any new prescription or non-prescription medications, including herbals, vitamins, non-steroidal anti-inflammatory drugs (NSAIDs) and supplements.  This website has more information on Eliquis  (apixaban ): http://www.eliquis .com/eliquis dena

## 2024-07-21 NOTE — TOC Progression Note (Signed)
 Transition of Care [facility]) - Progression Note    Patient Details  Name: [name] [name] MRN: [id number] Date of Birth: Jun 07, 1946  Transition of Care [facility]) CM/SW Contact  Toy LITTIE Agar, RN Phone Number:[phone]  07/21/2024, 3:19 PM  Clinical Narrative:    To whom it may concern: Please be advised that the above- named patient will require a short- term nursing home stay- anticipated 30 days or less for rehabilitation and strengthening. The plan is to return home.                      Expected Discharge Plan and Services                                               Social Drivers of Health (SDOH) Interventions SDOH Screenings   Food Insecurity: No Food Insecurity (07/19/2024)  Housing: Low Risk (07/19/2024)  Transportation Needs: No Transportation Needs (07/19/2024)  Utilities: Not At Risk (07/19/2024)  Social Connections: Unknown (07/19/2024)  Tobacco Use: Medium Risk (07/18/2024)    Readmission Risk Interventions     No data to display

## 2024-07-21 NOTE — Care Management Important Message (Signed)
 Important Message  Patient Details IM Letter given. Name: MATTEUS MCNELLY MRN: [id number] Date of Birth: [birth year]   Important Message Given:  Yes - Medicare IM     Jolana Runkles 07/21/2024, 3:13 PM

## 2024-07-21 NOTE — Plan of Care (Signed)
" °  Problem: Education: Goal: Individualized Educational Video(s) Outcome: Progressing   Problem: Coping: Goal: Ability to adjust to condition or change in health will improve Outcome: Progressing   Problem: Fluid Volume: Goal: Ability to maintain a balanced intake and output will improve Outcome: Progressing   Problem: Metabolic: Goal: Ability to maintain appropriate glucose levels will improve Outcome: Progressing   Problem: Nutritional: Goal: Maintenance of adequate nutrition will improve Outcome: Progressing Goal: Progress toward achieving an optimal weight will improve Outcome: Progressing   Problem: Skin Integrity: Goal: Risk for impaired skin integrity will decrease Outcome: Progressing   Problem: Tissue Perfusion: Goal: Adequacy of tissue perfusion will improve Outcome: Progressing   Problem: Clinical Measurements: Goal: Ability to maintain clinical measurements within normal limits will improve Outcome: Progressing Goal: Will remain free from infection Outcome: Progressing Goal: Diagnostic test results will improve Outcome: Progressing Goal: Respiratory complications will improve Outcome: Progressing Goal: Cardiovascular complication will be avoided Outcome: Progressing   Problem: Activity: Goal: Risk for activity intolerance will decrease Outcome: Progressing   Problem: Nutrition: Goal: Adequate nutrition will be maintained Outcome: Progressing   Problem: Coping: Goal: Level of anxiety will decrease Outcome: Progressing   Problem: Elimination: Goal: Will not experience complications related to bowel motility Outcome: Progressing Goal: Will not experience complications related to urinary retention Outcome: Progressing   Problem: Pain Managment: Goal: General experience of comfort will improve and/or be controlled Outcome: Progressing   Problem: Safety: Goal: Ability to remain free from injury will improve Outcome: Progressing   Problem: Skin  Integrity: Goal: Risk for impaired skin integrity will decrease Outcome: Progressing   "

## 2024-07-21 NOTE — TOC Initial Note (Addendum)
 Transition of Care [facility]) - Initial/Assessment Note    Patient Details  Name: [name] [name] MRN: [id number] Date of Birth: 02-03-1946  [facility] Care [facility]) CM/SW Contact:    Toy LITTIE Agar, RN Phone Number:[phone]  07/21/2024, 3:41 PM  Clinical Narrative:                 Inpatient CM following patient for SNF placement. CM at bedside spoke with wife and son. Both are in agreement for Short tem SNF. CM provided wife with medicare .gov list. CM has explained the process for bed search. Family has preference for ([facility], [facility], [facility] & [facility]). FL2 completed and faxed out. FL2 pending, additional documentation has been uploaded in [place] must for PASRR. Insurance auth to be initiated once PT notes are updated. Patient currently is not medically ready per MD watching fever IV abx have been stopped.   Expected Discharge Plan: Skilled Nursing Facility Barriers to Discharge: Continued Medical Work up   Patient Goals and CMS Choice Patient states their goals for this hospitalization and ongoing recovery are:: Wants to go home CMS Medicare.gov Compare Post Acute Care list provided to:: Patient Represenative (must comment) (wife and son at bedside) Choice offered to / list presented to : Spouse, Patient, Adult Children [place] ownership interest in [facility].provided to:: Spouse    Expected Discharge Plan and Services In-house Referral: NA Discharge Planning Services: CM Consult Post Acute Care Choice: Skilled Nursing Facility Living arrangements for the past 2 months: Single Family Home                 DME Arranged: N/A DME Agency: NA       HH Arranged: NA HH Agency: NA        Prior Living Arrangements/Services Living arrangements for the past 2 months: Single Family Home Lives with:: Spouse Patient language and need for interpreter reviewed:: Yes Do you feel safe going back to the place where you live?: Yes      Need for Family Participation in  Patient Care: Yes (Comment) Care giver support system in place?: Yes (comment)   Criminal Activity/Legal Involvement Pertinent to Current Situation/Hospitalization: No - Comment as needed  Activities of Daily Living   ADL Screening (condition at time of admission) Independently performs ADLs?: No Does the patient have a NEW difficulty with bathing/dressing/toileting/self-feeding that is expected to last >3 days?: Yes (Initiates electronic notice to provider for possible OT consult) Does the patient have a NEW difficulty with getting in/out of bed, walking, or climbing stairs that is expected to last >3 days?: Yes (Initiates electronic notice to provider for possible PT consult) Does the patient have a NEW difficulty with communication that is expected to last >3 days?: Yes (Initiates electronic notice to provider for possible SLP consult) Is the patient deaf or have difficulty hearing?: No Does the patient have difficulty seeing, even when wearing glasses/contacts?: No Does the patient have difficulty concentrating, remembering, or making decisions?: Yes  Permission Sought/Granted Permission sought to share information with : Family Supports Permission granted to share information with : Yes, Verbal Permission Granted  Share Information with NAME: Heron Comes [phone]     Permission granted to share info w Relationship: spouse  Permission granted to share info w Contact Information: [phone]  Emotional Assessment Appearance:: Appears stated age Attitude/Demeanor/Rapport: Unable to Assess (dementia quiet) Affect (typically observed): Flat Orientation: : Oriented to Self Alcohol / Substance Use: Not Applicable Psych Involvement: No (comment)  Admission diagnosis:  Pulmonary embolism (HCC) [I26.99] Acute hypoxemic respiratory failure (HCC) [J96.01] Other acute pulmonary embolism, unspecified whether acute cor pulmonale present [facility]) [I26.99] Patient Active Problem List    Diagnosis Date Noted   Dementia without behavioral disturbance (HCC) 07/19/2024   Acute deep vein thrombosis (DVT) of calf muscle vein of right lower extremity (HCC) 07/19/2024   Ambulatory dysfunction 07/19/2024   Pulmonary embolism (HCC) 07/18/2024   Aortic dilatation    Nonrheumatic mitral valve regurgitation    Aortic root dilation 07/25/2018   Aneurysm, ascending aorta 07/25/2018   Essential hypertension 07/25/2018   DM2 (diabetes mellitus, type 2) (HCC) 07/25/2018   Spinal stenosis of lumbar region with neurogenic claudication 07/25/2018   OSA on CPAP 07/25/2018   Hemorrhoids 07/26/2013   Left Ureteral stone 07/18/2011   Pyelonephritis 07/18/2011   Anal fissure 06/17/2011   History of laparoscopic adjustable gastric banding 06/17/2011   PCP:  Clarice Nottingham, MD Pharmacy:   [facility] [address] [place] [address] Phone: [phone] Fax: [phone]     Social Drivers of Health (SDOH) Social History: SDOH Screenings   Food Insecurity: No Food Insecurity (07/19/2024)  Housing: Low Risk (07/19/2024)  Transportation Needs: No Transportation Needs (07/19/2024)  Utilities: Not At Risk (07/19/2024)  Social Connections: Unknown (07/19/2024)  Tobacco Use: Medium Risk (07/18/2024)   SDOH Interventions:     Readmission Risk Interventions    07/21/2024    3:21 PM  Readmission Risk Prevention Plan  Transportation Screening Complete  PCP or Specialist Appt within 5-7 Days Complete  Home Care Screening Complete  Medication Review (RN CM) Complete

## 2024-07-22 DIAGNOSIS — I10 Essential (primary) hypertension: Secondary | ICD-10-CM | POA: Diagnosis not present

## 2024-07-22 DIAGNOSIS — F0390 Unspecified dementia without behavioral disturbance: Secondary | ICD-10-CM | POA: Diagnosis not present

## 2024-07-22 DIAGNOSIS — E119 Type 2 diabetes mellitus without complications: Secondary | ICD-10-CM | POA: Diagnosis not present

## 2024-07-22 DIAGNOSIS — R262 Difficulty in walking, not elsewhere classified: Secondary | ICD-10-CM | POA: Diagnosis not present

## 2024-07-22 DIAGNOSIS — I2694 Multiple subsegmental pulmonary emboli without acute cor pulmonale: Secondary | ICD-10-CM | POA: Diagnosis not present

## 2024-07-22 DIAGNOSIS — I82461 Acute embolism and thrombosis of right calf muscular vein: Secondary | ICD-10-CM | POA: Diagnosis not present

## 2024-07-22 LAB — BASIC METABOLIC PANEL WITH GFR
Anion gap: 11 (ref 5–15)
BUN: 7 mg/dL — ABNORMAL LOW (ref 8–23)
CO2: 25 mmol/L (ref 22–32)
Calcium: 8.9 mg/dL (ref 8.9–10.3)
Chloride: 101 mmol/L (ref 98–111)
Creatinine, Ser: 0.49 mg/dL — ABNORMAL LOW (ref 0.61–1.24)
GFR, Estimated: >60 mL/min
Glucose, Bld: 131 mg/dL — ABNORMAL HIGH (ref 70–99)
Potassium: 4 mmol/L (ref 3.5–5.1)
Sodium: 137 mmol/L (ref 135–145)

## 2024-07-22 LAB — CBC WITH DIFFERENTIAL/PLATELET
Abs Immature Granulocytes: 0.1 K/uL — ABNORMAL HIGH (ref 0.00–0.07)
Basophils Absolute: 0.1 K/uL (ref 0.0–0.1)
Basophils Relative: 1 %
Eosinophils Absolute: 0.4 K/uL (ref 0.0–0.5)
Eosinophils Relative: 4 %
HCT: 38.1 % — ABNORMAL LOW (ref 39.0–52.0)
Hemoglobin: 12.8 g/dL — ABNORMAL LOW (ref 13.0–17.0)
Immature Granulocytes: 1 %
Lymphocytes Relative: 15 %
Lymphs Abs: 1.4 K/uL (ref 0.7–4.0)
MCH: 31.4 pg (ref 26.0–34.0)
MCHC: 33.6 g/dL (ref 30.0–36.0)
MCV: 93.6 fL (ref 80.0–100.0)
Monocytes Absolute: 0.9 K/uL (ref 0.1–1.0)
Monocytes Relative: 10 %
Neutro Abs: 6.3 K/uL (ref 1.7–7.7)
Neutrophils Relative %: 69 %
Platelets: 221 K/uL (ref 150–400)
RBC: 4.07 MIL/uL — ABNORMAL LOW (ref 4.22–5.81)
RDW: 11.6 % (ref 11.5–15.5)
WBC: 9.1 K/uL (ref 4.0–10.5)
nRBC: 0 % (ref 0.0–0.2)

## 2024-07-22 LAB — MAGNESIUM: Magnesium: 2 mg/dL (ref 1.7–2.4)

## 2024-07-22 MED ORDER — FUROSEMIDE 10 MG/ML IJ SOLN
20.0000 mg | Freq: Once | INTRAMUSCULAR | Status: AC
  Administered 2024-07-22: 20 mg via INTRAVENOUS
  Filled 2024-07-22: qty 2

## 2024-07-22 NOTE — Progress Notes (Signed)
 Physical Therapy Treatment Patient Details Name: [name] [name] MRN: [id number] DOB: Jul 29, 1945 Today's Date: 07/22/2024   History of Present Illness Patient is a 79 yo male presenting with weakness on 07/18/24. Recent ED visit on 1/12 with MRI negative. Admitted with pulmonary embolism. PMH includes:  dementia, type 2 diabetes, hypertension, aortic aneurysm    PT Comments  Pt received in recliner with RN requesting return to bed, per family pt has been up in recliner for 8hours. Pt required total lift via Camie Ip +4 for safety due to agitation, family assisting with affective regulation, recommending total sling lift for staff moving forward for safety. Bed mobility total assist +2 for safety and physical assistance. Pt repositioned in bed with pillows under B/l shoulders and BLE with legs elevated, bed alarm on. Discharge destination remains appropriate. We will continue to follow acutely.    If plan is discharge home, recommend the following: Two people to help with walking and/or transfers;Two people to help with bathing/dressing/bathroom;Direct supervision/assist for medications management;Assistance with cooking/housework;Direct supervision/assist for financial management;Assist for transportation;Help with stairs or ramp for entrance   Can travel by private vehicle     No  Equipment Recommendations  Other (comment) (defer to next level of care)    Recommendations for Other Services       Precautions / Restrictions Precautions Precautions: Fall Recall of Precautions/Restrictions: Impaired Precaution/Restrictions Comments: baseline cog deficits secondary to advanced dementia Restrictions Weight Bearing Restrictions Per Provider Order: No     Mobility  Bed Mobility Overal bed mobility: Needs Assistance Bed Mobility: Sit to Supine       Sit to supine: +2 for physical assistance, +2 for safety/equipment, Total assist   General bed mobility comments: Sit to supine total  assist +2 for trunk lowering and BLe into bed.    Transfers Overall transfer level: Needs assistance   Transfers: Sit to/from Stand, Bed to chair/wheelchair/BSC Sit to Stand: Via lift equipment, +2 physical assistance, +2 safety/equipment           General transfer comment: Attempted +2 STS to [facility], pt with agitation/confusion with attempt to perform transfer, Wife attempted to provide comfort and education; with Son and Granddaughter assist able to come to partial stand enough to place in stedy safely, +2 for transfer recliner to bed, +4 for STS from stedy to bed. Receommending total sling lift moving forward for pt and staff safety. Transfer via Lift Equipment: Stedy  Ambulation/Gait                   Stairs             Wheelchair Mobility     Tilt Bed    Modified Rankin (Stroke Patients Only)       Balance Overall balance assessment: Needs assistance Sitting-balance support: Bilateral upper extremity supported, Feet supported Sitting balance-Leahy Scale: Poor Sitting balance - Comments: Able to sit EOB wit BUE support on Stedy and BLE support on Stedy                                    Communication Communication Communication: Impaired Factors Affecting Communication: Difficulty expressing self;Reduced clarity of speech  Cognition Arousal: Alert Behavior During Therapy: Agitated, Anxious   PT - Cognitive impairments: History of cognitive impairments, Orientation                         Following  commands: Impaired Following commands impaired: Follows one step commands inconsistently    Cueing Cueing Techniques: Verbal cues, Gestural cues, Tactile cues, Visual cues  Exercises      General Comments        Pertinent Vitals/Pain Pain Assessment Pain Assessment: Faces Faces Pain Scale: Hurts little more Breathing: normal Negative Vocalization: none Facial Expression: smiling or inexpressive Body Language:  relaxed Consolability: no need to console PAINAD Score: 0 Pain Location: RLE with mobility Pain Descriptors / Indicators: Grimacing, Guarding Pain Intervention(s): Monitored during session, Repositioned    Home Living                          Prior Function            PT Goals (current goals can now be found in the care plan section) Acute Rehab PT Goals Patient Stated Goal: get back home PT Goal Formulation: With family Time For Goal Achievement: 08/02/24 Potential to Achieve Goals: Fair Progress towards PT goals: Progressing toward goals    Frequency    Min 2X/week      PT Plan      Co-evaluation              AM-PAC PT 6 Clicks Mobility   Outcome Measure  Help needed turning from your back to your side while in a flat bed without using bedrails?: Total Help needed moving from lying on your back to sitting on the side of a flat bed without using bedrails?: Total Help needed moving to and from a bed to a chair (including a wheelchair)?: Total Help needed standing up from a chair using your arms (e.g., wheelchair or bedside chair)?: Total Help needed to walk in hospital room?: Total Help needed climbing 3-5 steps with a railing? : Total 6 Click Score: 6    End of Session Equipment Utilized During Treatment: Other (comment);Gait belt (Stedy) Activity Tolerance: Other (comment);Treatment limited secondary to agitation (difficulty following commands, pt resistant to mobility) Patient left: in bed;with family/visitor present;with call bell/phone within reach;with bed alarm set Nurse Communication: Mobility status;Need for lift equipment (total lift) PT Visit Diagnosis: Muscle weakness (generalized) (M62.81);Other abnormalities of gait and mobility (R26.89);Difficulty in walking, not elsewhere classified (R26.2)     Time: 8497-8479 PT Time Calculation (min) (ACUTE ONLY): 18 min  Charges:    $Therapeutic Activity: 8-22 mins PT General Charges $$  ACUTE PT VISIT: 1 Visit                     Elsie Grieves, PT, DPT [facility] Rehabilitation Department Office: [phone]   Elsie Grieves 07/22/2024, 3:29 PM

## 2024-07-22 NOTE — Plan of Care (Signed)
" °  Problem: Education: Goal: Individualized Educational Video(s) Outcome: Progressing   Problem: Coping: Goal: Ability to adjust to condition or change in health will improve Outcome: Progressing   Problem: Fluid Volume: Goal: Ability to maintain a balanced intake and output will improve Outcome: Progressing   Problem: Metabolic: Goal: Ability to maintain appropriate glucose levels will improve Outcome: Progressing   Problem: Nutritional: Goal: Maintenance of adequate nutrition will improve Outcome: Progressing Goal: Progress toward achieving an optimal weight will improve Outcome: Progressing   Problem: Skin Integrity: Goal: Risk for impaired skin integrity will decrease Outcome: Progressing   Problem: Tissue Perfusion: Goal: Adequacy of tissue perfusion will improve Outcome: Progressing   Problem: Education: Goal: Knowledge of General Education information will improve Description: Including pain rating scale, medication(s)/side effects and non-pharmacologic comfort measures Outcome: Progressing   Problem: Health Behavior/Discharge Planning: Goal: Ability to manage health-related needs will improve Outcome: Progressing   Problem: Clinical Measurements: Goal: Ability to maintain clinical measurements within normal limits will improve Outcome: Progressing Goal: Will remain free from infection Outcome: Progressing Goal: Diagnostic test results will improve Outcome: Progressing Goal: Respiratory complications will improve Outcome: Progressing Goal: Cardiovascular complication will be avoided Outcome: Progressing   Problem: Activity: Goal: Risk for activity intolerance will decrease Outcome: Progressing   Problem: Nutrition: Goal: Adequate nutrition will be maintained Outcome: Progressing   Problem: Coping: Goal: Level of anxiety will decrease Outcome: Progressing   Problem: Elimination: Goal: Will not experience complications related to bowel  motility Outcome: Progressing Goal: Will not experience complications related to urinary retention Outcome: Progressing   Problem: Pain Managment: Goal: General experience of comfort will improve and/or be controlled Outcome: Progressing   Problem: Safety: Goal: Ability to remain free from injury will improve Outcome: Progressing   Problem: Skin Integrity: Goal: Risk for impaired skin integrity will decrease Outcome: Progressing   "

## 2024-07-22 NOTE — Progress Notes (Signed)
 " PROGRESS NOTE    [name] [name]  FMW:[id number] DOB: 22-May-1946 DOA: 07/18/2024 PCP: Clarice Nottingham, MD    Chief Complaint  Patient presents with   Weakness    Brief Narrative:  Patient 79 year old gentleman history of advanced dementia, type 2 diabetes, hypertension presented with severe weakness x 3 days.  Patient noted at baseline with the use of a walker, wife is primary caregiver however patient noted to have been significantly weak with difficulty ambulating over the past couple of days.  Patient noted to be leaning to the right side and he was brought to the ED on 07/17/2024 where MRI of the brain was done which was negative for any acute infarcts.  Patient noted to have a fever of 101 and subsequently brought to the ED.  CT chest abdomen and pelvis was done concerning for right lower lobe pulmonary embolism.  Patient placed on heparin  drip.  Patient also placed empirically on IV antibiotics pending infectious workup.     Assessment & Plan:   Principal Problem:   Pulmonary embolism (HCC) Active Problems:   Dementia without behavioral disturbance (HCC)   Acute deep vein thrombosis (DVT) of calf muscle vein of right lower extremity (HCC)   Ambulatory dysfunction  #1 right lower lobe PE with mild respiratory failure requiring 2 L O2 nasal cannula/acute right lower extremity DVT involving right peroneal vein - Patient noted to have presented with worsening weakness, difficulty ambulating, noted to have a fever. - CT chest abdomen and pelvis done was consistent with acute PE in the right lower lobe extending to segmental branches without CT evidence of right heart strain. - Lower extremity Dopplers done with an acute right lower extremity DVT involving the right peroneal vein. - 2D echo ordered and done with a EF of 50 to 55%,NWMA, negative for right ventricular strain. - Patient was on IV heparin  and has been transition to Eliquis .     -Patient noted to have been pancultured due to  concern for fevers with urine cultures with no growth to date, blood cultures pending with no growth to date x 3 days. - Was on IV Rocephin  which was discontinued.   - Patient remains afebrile.  - PT/OT.   2.  Advanced dementia - Continue Aricept , Namenda , Rexulti .     3.  Hypertension - Avapro . -Lasix  20 mg IV x 1.   4.  Ambulatory dysfunction -Per admitting physician it is noted that patient's wife would like him to return home but needs to be able to ambulate with a walker prior to that as wife unable to lift patient. - PT/OT assessed patient and recommended SNF placement   5.  Well-controlled diabetes mellitus type 2 -Hemoglobin A1c 5.6. - CBG noted at 126 on 07/21/2024.  -Glucose of 131 on lab this morning. - Per med rec patient noted to be on Ozempic . - SSI has been discontinued.    6.  Hypokalemia -Repleted.      DVT prophylaxis: Heparin  drip>>> Eliquis  Code Status: DNR Family Communication: Updated wife, at bedside. Disposition: SNF  Status is: Inpatient Remains inpatient appropriate because: Severity of illness   Consultants:  None  Procedures:  CT chest abdomen pelvis 07/18/2024 Chest x-ray 07/18/2024 2D echo 07/19/2024 Lower extremity Dopplers 07/19/2024  Antimicrobials:  Anti-infectives (From admission, onward)    Start     Dose/Rate Route Frequency Ordered Stop   07/19/24 0800  cefTRIAXone  (ROCEPHIN ) 2 g in sodium chloride  0.9 % 100 mL IVPB  Status:  Discontinued  2 g 200 mL/hr over 30 Minutes Intravenous Every 24 hours 07/19/24 0043 07/21/24 0743   07/18/24 1930  azithromycin  (ZITHROMAX ) tablet 500 mg  Status:  Discontinued        500 mg Oral Daily 07/18/24 1916 07/19/24 0043   07/18/24 1915  cefTRIAXone  (ROCEPHIN ) 1 g in sodium chloride  0.9 % 100 mL IVPB        1 g 200 mL/hr over 30 Minutes Intravenous  Once 07/18/24 1906 07/18/24 2117   07/18/24 1915  doxycycline (VIBRAMYCIN) 100 mg in sodium chloride  0.9 % 250 mL IVPB  Status:  Discontinued         100 mg 125 mL/hr over 120 Minutes Intravenous Every 12 hours 07/18/24 1906 07/18/24 1916         Subjective: Patient sitting up in recliner, wife at bedside.  Patient denies any chest pain or shortness of breath.  No abdominal pain.  Patient slowly improving.  No bleeding.  Wife states after he got Lasix  had significant urine output filling up the canister.  Per RN patient had 800 cc output right after he was given Lasix  this morning.   Objective: Vitals:   07/21/24 0407 07/21/24 1319 07/21/24 2142 07/22/24 0619  BP: (!) 141/78 (!) 148/87 (!) 158/97 (!) 161/110  Pulse: 71 68 71 69  Resp: 14  16 16   Temp: 98.8 F (37.1 C) 98.2 F (36.8 C) 98.3 F (36.8 C) 98.2 F (36.8 C)  TempSrc: Oral Oral Oral Oral  SpO2: 95% 91% 94% 92%  Weight:      Height:        Intake/Output Summary (Last 24 hours) at 07/22/2024 1205 Last data filed at 07/22/2024 1100 Gross per 24 hour  Intake 240 ml  Output 1600 ml  Net -1360 ml   Filed Weights   07/19/24 0852  Weight: 115.7 kg    Examination:  General exam: NAD Respiratory system: CTAB.  No wheezes, no crackles, no rhonchi.  Fair air movement.  Speaking in full sentences.  Cardiovascular system: Regular rate rhythm no murmurs rubs gallops.  No JVD.  No lower extremity edema.  Gastrointestinal system: Abdomen is soft, nontender, nondistended, positive bowel sound.  No rebound.  No guarding.  Central nervous system: Alert.  Moving extremities spontaneously.  No focal neurological deficits. Extremities: Symmetric 5 x 5 power. Skin: No rashes, lesions or ulcers Psychiatry: Judgement and insight appear poor to fair.. Mood & affect appropriate.     Data Reviewed: I have personally reviewed following labs and imaging studies  CBC: Recent Labs  Lab 07/17/24 1438 07/18/24 1829 07/19/24 0540 07/20/24 0642 07/22/24 0633  WBC 11.2* 12.5* 10.4 10.5 9.1  NEUTROABS 7.9* 9.8*  --   --  6.3  HGB 13.5 12.8* 11.2* 11.2* 12.8*  HCT 41.8  38.9* 33.5* 33.0* 38.1*  MCV 98.6 98.2 96.8 97.6 93.6  PLT 161 148* 135* 137* 221    Basic Metabolic Panel: Recent Labs  Lab 07/18/24 1829 07/19/24 0540 07/20/24 0642 07/21/24 0706 07/22/24 0633  NA 141 142 138 137 137  K 3.7 3.3* 3.7 3.4* 4.0  CL 103 106 107 102 101  CO2 27 27 23 28 25   GLUCOSE 159* 126* 116* 123* 131*  BUN 19 13 11  7* 7*  CREATININE 0.66 0.52* 0.46* 0.45* 0.49*  CALCIUM  9.2 8.6* 8.5* 8.4* 8.9  MG  --  2.0 2.1  --  2.0  PHOS  --   --  2.6  --   --  GFR: Estimated Creatinine Clearance: 96.7 mL/min (A) (by C-G formula based on SCr of 0.49 mg/dL (L)).  Liver Function Tests: Recent Labs  Lab 07/17/24 1438 07/18/24 1829 07/20/24 0642  AST 22 18  --   ALT 10 12  --   ALKPHOS 79 75  --   BILITOT 1.0 0.8  --   PROT 7.5 7.0  --   ALBUMIN 4.1 3.9 3.0*    CBG: Recent Labs  Lab 07/20/24 1728 07/20/24 2108 07/21/24 0743 07/21/24 1211 07/21/24 1632  GLUCAP 144* 110* 126* 138* 125*     Recent Results (from the past 240 hours)  Resp panel by RT-PCR (RSV, Flu A&B, Covid) Anterior Nasal Swab     Status: None   Collection Time: 07/18/24  6:05 PM   Specimen: Anterior Nasal Swab  Result Value Ref Range Status   SARS Coronavirus 2 by RT PCR NEGATIVE NEGATIVE Final    Comment: (NOTE) SARS-CoV-2 target nucleic acids are NOT DETECTED.  The SARS-CoV-2 RNA is generally detectable in upper respiratory specimens during the acute phase of infection. The lowest concentration of SARS-CoV-2 viral copies this assay can detect is 138 copies/mL. A negative result does not preclude SARS-Cov-2 infection and should not be used as the sole basis for treatment or other patient management decisions. A negative result may occur with  improper specimen collection/handling, submission of specimen other than nasopharyngeal swab, presence of viral mutation(s) within the areas targeted by this assay, and inadequate number of viral copies(<138 copies/mL). A negative result  must be combined with clinical observations, patient history, and epidemiological information. The expected result is Negative.  Fact Sheet for Patients:  bloggercourse.com  Fact Sheet for Healthcare Providers:  seriousbroker.it  This test is no t yet approved or cleared by the [place]  FDA and  has been authorized for detection and/or diagnosis of SARS-CoV-2 by FDA under an Emergency Use Authorization (EUA). This EUA will remain  in effect (meaning this test can be used) for the duration of the COVID-19 declaration under Section 564(b)(1) of the Act, 21 U.S.C.section 360bbb-3(b)(1), unless the authorization is terminated  or revoked sooner.       Influenza A by PCR NEGATIVE NEGATIVE Final   Influenza B by PCR NEGATIVE NEGATIVE Final    Comment: (NOTE) The Xpert Xpress SARS-CoV-2/FLU/RSV plus assay is intended as an aid in the diagnosis of influenza from Nasopharyngeal swab specimens and should not be used as a sole basis for treatment. Nasal washings and aspirates are unacceptable for Xpert Xpress SARS-CoV-2/FLU/RSV testing.  Fact Sheet for Patients: bloggercourse.com  Fact Sheet for Healthcare Providers: seriousbroker.it  This test is not yet approved or cleared by the [place]  FDA and has been authorized for detection and/or diagnosis of SARS-CoV-2 by FDA under an Emergency Use Authorization (EUA). This EUA will remain in effect (meaning this test can be used) for the duration of the COVID-19 declaration under Section 564(b)(1) of the Act, 21 U.S.C. section 360bbb-3(b)(1), unless the authorization is terminated or revoked.     Resp Syncytial Virus by PCR NEGATIVE NEGATIVE Final    Comment: (NOTE) Fact Sheet for Patients: bloggercourse.com  Fact Sheet for Healthcare Providers: seriousbroker.it  This test is  not yet approved or cleared by the [place]  FDA and has been authorized for detection and/or diagnosis of SARS-CoV-2 by FDA under an Emergency Use Authorization (EUA). This EUA will remain in effect (meaning this test can be used) for the duration of the COVID-19 declaration under Section 564(b)(1) of  the Act, 21 U.S.C. section 360bbb-3(b)(1), unless the authorization is terminated or revoked.  Performed at [facility], [address] [place], [place] [address]   Blood Culture (routine x 2)     Status: None (Preliminary result)   Collection Time: 07/18/24  6:11 PM   Specimen: Right Antecubital; Blood  Result Value Ref Range Status   Specimen Description   Final    RIGHT ANTECUBITAL Performed at [facility], [address], [place], [place] [address]    Special Requests   Final    BOTTLES DRAWN AEROBIC AND ANAEROBIC Blood Culture adequate volume Performed at [facility], [address], [place], [place] [address]    Culture   Final    NO GROWTH 4 DAYS Performed at [facility], [address], [place], [place] [address]    Report Status PENDING  Incomplete  Urine Culture     Status: None   Collection Time: 07/19/24  2:01 AM   Specimen: Urine, Random  Result Value Ref Range Status   Specimen Description   Final    URINE, RANDOM Performed at [facility], [address], [place], [place] [address]    Special Requests   Final    NONE Reflexed from [id number] Performed at [facility], [address], [place], [place] [address]    Culture   Final    NO GROWTH Performed at [facility], [address] [place], [place] [address]    Report Status 07/20/2024 FINAL  Final  Blood Culture (routine x 2)     Status: None (Preliminary result)   Collection Time: 07/19/24  5:51 PM   Specimen: BLOOD RIGHT HAND  Result Value Ref Range Status   Specimen Description   Final    BLOOD RIGHT  HAND Performed at  [facility], [address], [place], [place] [address]    Special Requests   Final    BOTTLES DRAWN AEROBIC AND ANAEROBIC Blood Culture adequate volume Performed at [facility], [address] [place], [place] [address]    Culture   Final    NO GROWTH 3 DAYS Performed at [facility], [address], [place], [place] [address]    Report Status PENDING  Incomplete         Radiology Studies: No results found.       Scheduled Meds:  apixaban   10 mg Oral BID   Followed by   NOREEN ON 07/28/2024] apixaban   5 mg Oral BID   brexpiprazole   1 mg Oral Daily   brimonidine   1 drop Both Eyes TID   donepezil   10 mg Oral QHS   dutasteride   0.5 mg Oral Daily   Gerhardt's butt cream   Topical BID   irbesartan   75 mg Oral Daily   magnesium  hydroxide  30 mL Oral Once   memantine   10 mg Oral BID   mouth rinse  15 mL Mouth Rinse 4 times per day   sertraline   100 mg Oral Daily   sodium chloride  flush  3 mL Intravenous Q12H   Continuous Infusions:     LOS: 4 days    Time spent: 40 minutes    Toribio Hummer, MD [facility]   To contact the attending provider between 7A-7P or the covering provider during after hours 7P-7A, please log into the web site [email or web address] and access using universal [place] password for that web  site. If you do not have the password, please call the hospital operator.  07/22/2024, 12:05 PM    "

## 2024-07-23 DIAGNOSIS — F0390 Unspecified dementia without behavioral disturbance: Secondary | ICD-10-CM | POA: Diagnosis not present

## 2024-07-23 DIAGNOSIS — E119 Type 2 diabetes mellitus without complications: Secondary | ICD-10-CM | POA: Diagnosis not present

## 2024-07-23 DIAGNOSIS — R262 Difficulty in walking, not elsewhere classified: Secondary | ICD-10-CM | POA: Diagnosis not present

## 2024-07-23 DIAGNOSIS — I10 Essential (primary) hypertension: Secondary | ICD-10-CM | POA: Diagnosis not present

## 2024-07-23 DIAGNOSIS — I82461 Acute embolism and thrombosis of right calf muscular vein: Secondary | ICD-10-CM | POA: Diagnosis not present

## 2024-07-23 DIAGNOSIS — I2694 Multiple subsegmental pulmonary emboli without acute cor pulmonale: Secondary | ICD-10-CM | POA: Diagnosis not present

## 2024-07-23 LAB — BASIC METABOLIC PANEL WITH GFR
Anion gap: 9 (ref 5–15)
BUN: 11 mg/dL (ref 8–23)
CO2: 26 mmol/L (ref 22–32)
Calcium: 8.7 mg/dL — ABNORMAL LOW (ref 8.9–10.3)
Chloride: 100 mmol/L (ref 98–111)
Creatinine, Ser: 0.54 mg/dL — ABNORMAL LOW (ref 0.61–1.24)
GFR, Estimated: >60 mL/min
Glucose, Bld: 126 mg/dL — ABNORMAL HIGH (ref 70–99)
Potassium: 3.6 mmol/L (ref 3.5–5.1)
Sodium: 135 mmol/L (ref 135–145)

## 2024-07-23 LAB — CBC
HCT: 36.4 % — ABNORMAL LOW (ref 39.0–52.0)
Hemoglobin: 12.4 g/dL — ABNORMAL LOW (ref 13.0–17.0)
MCH: 32 pg (ref 26.0–34.0)
MCHC: 34.1 g/dL (ref 30.0–36.0)
MCV: 94.1 fL (ref 80.0–100.0)
Platelets: 228 K/uL (ref 150–400)
RBC: 3.87 MIL/uL — ABNORMAL LOW (ref 4.22–5.81)
RDW: 11.9 % (ref 11.5–15.5)
WBC: 9.5 K/uL (ref 4.0–10.5)
nRBC: 0 % (ref 0.0–0.2)

## 2024-07-23 LAB — CULTURE, BLOOD (ROUTINE X 2)
Culture: NO GROWTH
Special Requests: ADEQUATE

## 2024-07-23 NOTE — Progress Notes (Signed)
 " PROGRESS NOTE    [name] [name]  FMW:[id number] DOB: 01-Dec-1945 DOA: 07/18/2024 PCP: Clarice Nottingham, MD    Chief Complaint  Patient presents with   Weakness    Brief Narrative:  Patient 79 year old gentleman history of advanced dementia, type 2 diabetes, hypertension presented with severe weakness x 3 days.  Patient noted at baseline with the use of a walker, wife is primary caregiver however patient noted to have been significantly weak with difficulty ambulating over the past couple of days.  Patient noted to be leaning to the right side and he was brought to the ED on 07/17/2024 where MRI of the brain was done which was negative for any acute infarcts.  Patient noted to have a fever of 101 and subsequently brought to the ED.  CT chest abdomen and pelvis was done concerning for right lower lobe pulmonary embolism.  Patient placed on heparin  drip.  Patient also placed empirically on IV antibiotics pending infectious workup.     Assessment & Plan:   Principal Problem:   Pulmonary embolism (HCC) Active Problems:   Dementia without behavioral disturbance (HCC)   Acute deep vein thrombosis (DVT) of calf muscle vein of right lower extremity (HCC)   Ambulatory dysfunction  #1 right lower lobe PE with mild respiratory failure requiring 2 L O2 nasal cannula/acute right lower extremity DVT involving right peroneal vein - Patient noted to have presented with worsening weakness, difficulty ambulating, noted to have a fever. - CT chest abdomen and pelvis done was consistent with acute PE in the right lower lobe extending to segmental branches without CT evidence of right heart strain. - Lower extremity Dopplers done with an acute right lower extremity DVT involving the right peroneal vein. - 2D echo ordered and done with a EF of 50 to 55%,NWMA, negative for right ventricular strain. - Patient was on IV heparin  and has been transitioned to Eliquis .     -Patient noted to have been pancultured due to  concern for fevers with urine cultures with no growth to date, blood cultures pending with no growth to date x 4-5 days. - IV Rocephin  has been discontinued.  - Patient remains afebrile.  - PT/OT.   2.  Advanced dementia - Continue Aricept , Namenda , Rexulti .     3.  Hypertension - Avapro . - Status post Lasix  20 mg IV x 1.   4.  Ambulatory dysfunction -Per admitting physician it is noted that patient's wife would like him to return home but needs to be able to ambulate with a walker prior to that as wife unable to lift patient. - PT/OT assessed patient and recommended SNF placement   5.  Well-controlled diabetes mellitus type 2 -Hemoglobin A1c 5.6. - CBG noted at 126 on 07/21/2024.  -Glucose of 126 on BMP this morning. - Per med rec patient noted to be on Ozempic . - SSI has been discontinued.    6.  Hypokalemia -Repleted.      DVT prophylaxis: Heparin  drip>>> Eliquis  Code Status: DNR Family Communication: Updated wife, and son at bedside. Disposition: SNF when bed available.  Status is: Inpatient Remains inpatient appropriate because: Severity of illness   Consultants:  None  Procedures:  CT chest abdomen pelvis 07/18/2024 Chest x-ray 07/18/2024 2D echo 07/19/2024 Lower extremity Dopplers 07/19/2024  Antimicrobials:  Anti-infectives (From admission, onward)    Start     Dose/Rate Route Frequency Ordered Stop   07/19/24 0800  cefTRIAXone  (ROCEPHIN ) 2 g in sodium chloride  0.9 % 100 mL IVPB  Status:  Discontinued        2 g 200 mL/hr over 30 Minutes Intravenous Every 24 hours 07/19/24 0043 07/21/24 0743   07/18/24 1930  azithromycin  (ZITHROMAX ) tablet 500 mg  Status:  Discontinued        500 mg Oral Daily 07/18/24 1916 07/19/24 0043   07/18/24 1915  cefTRIAXone  (ROCEPHIN ) 1 g in sodium chloride  0.9 % 100 mL IVPB        1 g 200 mL/hr over 30 Minutes Intravenous  Once 07/18/24 1906 07/18/24 2117   07/18/24 1915  doxycycline (VIBRAMYCIN) 100 mg in sodium chloride  0.9 %  250 mL IVPB  Status:  Discontinued        100 mg 125 mL/hr over 120 Minutes Intravenous Every 12 hours 07/18/24 1906 07/18/24 1916         Subjective: Patient sleeping, easily arousable.  Denies any chest pain or shortness of breath.  Denies any abdominal pain.  No bleeding noted.  Wife and son at bedside.   Objective: Vitals:   07/22/24 2043 07/22/24 2043 07/23/24 0440 07/23/24 0500  BP: (!) 151/95 (!) 151/95 (!) 151/98   Pulse: 73 75 74   Resp: 18 18 18    Temp: 98.1 F (36.7 C) 98.1 F (36.7 C) 97.6 F (36.4 C)   TempSrc: Oral Oral Oral   SpO2: 96% 95% 93%   Weight:    112.7 kg  Height:        Intake/Output Summary (Last 24 hours) at 07/23/2024 1123 Last data filed at 07/23/2024 0415 Gross per 24 hour  Intake 240 ml  Output 800 ml  Net -560 ml   Filed Weights   07/19/24 0852 07/23/24 0500  Weight: 115.7 kg 112.7 kg    Examination:  General exam: NAD Respiratory system: Lungs clear to auscultation bilaterally anterior lung fields.  No wheezes, no crackles, no rhonchi.  Fair air movement.  Speaking in full sentences.  Cardiovascular system: RRR no murmurs rubs or gallops.  No JVD.  No lower extremity edema.  Gastrointestinal system: Abdomen is soft, nontender, nondistended, positive bowel sounds.  No rebound.  No guarding. Central nervous system: Alert.  Moving extremities spontaneously.  No focal neurological deficits. Extremities: Symmetric 5 x 5 power. Skin: No rashes, lesions or ulcers Psychiatry: Judgement and insight appear poor to fair.. Mood & affect appropriate.     Data Reviewed: I have personally reviewed following labs and imaging studies  CBC: Recent Labs  Lab 07/17/24 1438 07/18/24 1829 07/19/24 0540 07/20/24 0642 07/22/24 0633 07/23/24 0619  WBC 11.2* 12.5* 10.4 10.5 9.1 9.5  NEUTROABS 7.9* 9.8*  --   --  6.3  --   HGB 13.5 12.8* 11.2* 11.2* 12.8* 12.4*  HCT 41.8 38.9* 33.5* 33.0* 38.1* 36.4*  MCV 98.6 98.2 96.8 97.6 93.6 94.1  PLT 161  148* 135* 137* 221 228    Basic Metabolic Panel: Recent Labs  Lab 07/19/24 0540 07/20/24 0642 07/21/24 0706 07/22/24 0633 07/23/24 0619  NA 142 138 137 137 135  K 3.3* 3.7 3.4* 4.0 3.6  CL 106 107 102 101 100  CO2 27 23 28 25 26   GLUCOSE 126* 116* 123* 131* 126*  BUN 13 11 7* 7* 11  CREATININE 0.52* 0.46* 0.45* 0.49* 0.54*  CALCIUM  8.6* 8.5* 8.4* 8.9 8.7*  MG 2.0 2.1  --  2.0  --   PHOS  --  2.6  --   --   --     GFR: Estimated Creatinine Clearance: 95.4  mL/min (A) (by C-G formula based on SCr of 0.54 mg/dL (L)).  Liver Function Tests: Recent Labs  Lab 07/17/24 1438 07/18/24 1829 07/20/24 0642  AST 22 18  --   ALT 10 12  --   ALKPHOS 79 75  --   BILITOT 1.0 0.8  --   PROT 7.5 7.0  --   ALBUMIN 4.1 3.9 3.0*    CBG: Recent Labs  Lab 07/20/24 1728 07/20/24 2108 07/21/24 0743 07/21/24 1211 07/21/24 1632  GLUCAP 144* 110* 126* 138* 125*     Recent Results (from the past 240 hours)  Resp panel by RT-PCR (RSV, Flu A&B, Covid) Anterior Nasal Swab     Status: None   Collection Time: 07/18/24  6:05 PM   Specimen: Anterior Nasal Swab  Result Value Ref Range Status   SARS Coronavirus 2 by RT PCR NEGATIVE NEGATIVE Final    Comment: (NOTE) SARS-CoV-2 target nucleic acids are NOT DETECTED.  The SARS-CoV-2 RNA is generally detectable in upper respiratory specimens during the acute phase of infection. The lowest concentration of SARS-CoV-2 viral copies this assay can detect is 138 copies/mL. A negative result does not preclude SARS-Cov-2 infection and should not be used as the sole basis for treatment or other patient management decisions. A negative result may occur with  improper specimen collection/handling, submission of specimen other than nasopharyngeal swab, presence of viral mutation(s) within the areas targeted by this assay, and inadequate number of viral copies(<138 copies/mL). A negative result must be combined with clinical observations, patient  history, and epidemiological information. The expected result is Negative.  Fact Sheet for Patients:  bloggercourse.com  Fact Sheet for Healthcare Providers:  seriousbroker.it  This test is no t yet approved or cleared by the [place]  FDA and  has been authorized for detection and/or diagnosis of SARS-CoV-2 by FDA under an Emergency Use Authorization (EUA). This EUA will remain  in effect (meaning this test can be used) for the duration of the COVID-19 declaration under Section 564(b)(1) of the Act, 21 U.S.C.section 360bbb-3(b)(1), unless the authorization is terminated  or revoked sooner.       Influenza A by PCR NEGATIVE NEGATIVE Final   Influenza B by PCR NEGATIVE NEGATIVE Final    Comment: (NOTE) The Xpert Xpress SARS-CoV-2/FLU/RSV plus assay is intended as an aid in the diagnosis of influenza from Nasopharyngeal swab specimens and should not be used as a sole basis for treatment. Nasal washings and aspirates are unacceptable for Xpert Xpress SARS-CoV-2/FLU/RSV testing.  Fact Sheet for Patients: bloggercourse.com  Fact Sheet for Healthcare Providers: seriousbroker.it  This test is not yet approved or cleared by the [place]  FDA and has been authorized for detection and/or diagnosis of SARS-CoV-2 by FDA under an Emergency Use Authorization (EUA). This EUA will remain in effect (meaning this test can be used) for the duration of the COVID-19 declaration under Section 564(b)(1) of the Act, 21 U.S.C. section 360bbb-3(b)(1), unless the authorization is terminated or revoked.     Resp Syncytial Virus by PCR NEGATIVE NEGATIVE Final    Comment: (NOTE) Fact Sheet for Patients: bloggercourse.com  Fact Sheet for Healthcare Providers: seriousbroker.it  This test is not yet approved or cleared by the [place]  FDA  and has been authorized for detection and/or diagnosis of SARS-CoV-2 by FDA under an Emergency Use Authorization (EUA). This EUA will remain in effect (meaning this test can be used) for the duration of the COVID-19 declaration under Section 564(b)(1) of the Act, 21 U.S.C. section  360bbb-3(b)(1), unless the authorization is terminated or revoked.  Performed at [facility], [address] [place], [place] [address]   Blood Culture (routine x 2)     Status: None   Collection Time: 07/18/24  6:11 PM   Specimen: Right Antecubital; Blood  Result Value Ref Range Status   Specimen Description   Final    RIGHT ANTECUBITAL Performed at [facility], [address] [place], [place] [address]    Special Requests   Final    BOTTLES DRAWN AEROBIC AND ANAEROBIC Blood Culture adequate volume Performed at [facility], [address], [place], [place] [address]    Culture   Final    NO GROWTH 5 DAYS Performed at [facility], [address], [place], [place] [address]    Report Status 07/23/2024 FINAL  Final  Urine Culture     Status: None   Collection Time: 07/19/24  2:01 AM   Specimen: Urine, Random  Result Value Ref Range Status   Specimen Description   Final    URINE, RANDOM Performed at [facility], [address] [place], [place] [address]    Special Requests   Final    NONE Reflexed from [id number] Performed at [facility], [address] [place], [place] [address]    Culture   Final    NO GROWTH Performed at [facility], [address], [place], [place] [address]    Report Status 07/20/2024 FINAL  Final  Blood Culture (routine x 2)     Status: None (Preliminary result)   Collection Time: 07/19/24  5:51 PM   Specimen: BLOOD RIGHT HAND  Result Value Ref Range Status   Specimen Description   Final    BLOOD RIGHT HAND Performed at [facility], [address]  [place], [place] [address]    Special Requests   Final    BOTTLES DRAWN AEROBIC AND ANAEROBIC Blood Culture adequate volume Performed at [facility], [address], [place], [place] [address]    Culture   Final    NO GROWTH 4 DAYS Performed at [facility], [address], [place], [place] [address]    Report Status PENDING  Incomplete         Radiology Studies: No results found.       Scheduled Meds:  apixaban   10 mg Oral BID   Followed by   NOREEN ON 07/28/2024] apixaban   5 mg Oral BID   brexpiprazole   1 mg Oral Daily   brimonidine   1 drop Both Eyes TID   donepezil   10 mg Oral QHS   dutasteride   0.5 mg Oral Daily   Gerhardt's butt cream   Topical BID   irbesartan   75 mg Oral Daily   magnesium  hydroxide  30 mL Oral Once   memantine   10 mg Oral BID   mouth rinse  15 mL Mouth Rinse 4 times per day   sertraline   100 mg Oral Daily   sodium chloride  flush  3 mL Intravenous Q12H   Continuous Infusions:     LOS: 5 days    Time spent: 40 minutes    Toribio Hummer, MD [facility]   To contact the attending provider between 7A-7P or the covering provider during after hours 7P-7A, please log into the web site [email or web address] and access using universal [place] password for that web site. If you do not have  the password, please call the hospital operator.  07/23/2024, 11:23 AM    "

## 2024-07-23 NOTE — Plan of Care (Signed)

## 2024-07-23 NOTE — Plan of Care (Signed)
" °  Problem: Education: Goal: Individualized Educational Video(s) Outcome: Progressing   Problem: Coping: Goal: Ability to adjust to condition or change in health will improve Outcome: Progressing   Problem: Fluid Volume: Goal: Ability to maintain a balanced intake and output will improve Outcome: Progressing   Problem: Metabolic: Goal: Ability to maintain appropriate glucose levels will improve Outcome: Progressing   Problem: Nutritional: Goal: Maintenance of adequate nutrition will improve Outcome: Progressing Goal: Progress toward achieving an optimal weight will improve Outcome: Progressing   Problem: Skin Integrity: Goal: Risk for impaired skin integrity will decrease Outcome: Progressing   Problem: Tissue Perfusion: Goal: Adequacy of tissue perfusion will improve Outcome: Progressing   Problem: Education: Goal: Knowledge of General Education information will improve Description: Including pain rating scale, medication(s)/side effects and non-pharmacologic comfort measures Outcome: Progressing   Problem: Health Behavior/Discharge Planning: Goal: Ability to manage health-related needs will improve Outcome: Progressing   Problem: Clinical Measurements: Goal: Ability to maintain clinical measurements within normal limits will improve Outcome: Progressing Goal: Will remain free from infection Outcome: Progressing Goal: Diagnostic test results will improve Outcome: Progressing Goal: Respiratory complications will improve Outcome: Progressing Goal: Cardiovascular complication will be avoided Outcome: Progressing   Problem: Activity: Goal: Risk for activity intolerance will decrease Outcome: Progressing   Problem: Nutrition: Goal: Adequate nutrition will be maintained Outcome: Progressing   Problem: Coping: Goal: Level of anxiety will decrease Outcome: Progressing   Problem: Elimination: Goal: Will not experience complications related to bowel  motility Outcome: Progressing Goal: Will not experience complications related to urinary retention Outcome: Progressing   Problem: Pain Managment: Goal: General experience of comfort will improve and/or be controlled Outcome: Progressing   Problem: Safety: Goal: Ability to remain free from injury will improve Outcome: Progressing   Problem: Skin Integrity: Goal: Risk for impaired skin integrity will decrease Outcome: Progressing   "

## 2024-07-24 DIAGNOSIS — J9601 Acute respiratory failure with hypoxia: Secondary | ICD-10-CM

## 2024-07-24 DIAGNOSIS — I2694 Multiple subsegmental pulmonary emboli without acute cor pulmonale: Secondary | ICD-10-CM | POA: Diagnosis not present

## 2024-07-24 DIAGNOSIS — R262 Difficulty in walking, not elsewhere classified: Secondary | ICD-10-CM | POA: Diagnosis not present

## 2024-07-24 DIAGNOSIS — F0390 Unspecified dementia without behavioral disturbance: Secondary | ICD-10-CM | POA: Diagnosis not present

## 2024-07-24 DIAGNOSIS — I82461 Acute embolism and thrombosis of right calf muscular vein: Secondary | ICD-10-CM | POA: Diagnosis not present

## 2024-07-24 LAB — CBC
HCT: 35.7 % — ABNORMAL LOW (ref 39.0–52.0)
Hemoglobin: 12.1 g/dL — ABNORMAL LOW (ref 13.0–17.0)
MCH: 32.1 pg (ref 26.0–34.0)
MCHC: 33.9 g/dL (ref 30.0–36.0)
MCV: 94.7 fL (ref 80.0–100.0)
Platelets: 242 K/uL (ref 150–400)
RBC: 3.77 MIL/uL — ABNORMAL LOW (ref 4.22–5.81)
RDW: 11.9 % (ref 11.5–15.5)
WBC: 9.6 K/uL (ref 4.0–10.5)
nRBC: 0 % (ref 0.0–0.2)

## 2024-07-24 LAB — BASIC METABOLIC PANEL WITH GFR
Anion gap: 8 (ref 5–15)
BUN: 11 mg/dL (ref 8–23)
CO2: 26 mmol/L (ref 22–32)
Calcium: 8.7 mg/dL — ABNORMAL LOW (ref 8.9–10.3)
Chloride: 101 mmol/L (ref 98–111)
Creatinine, Ser: 0.52 mg/dL — ABNORMAL LOW (ref 0.61–1.24)
GFR, Estimated: >60 mL/min
Glucose, Bld: 107 mg/dL — ABNORMAL HIGH (ref 70–99)
Potassium: 3.9 mmol/L (ref 3.5–5.1)
Sodium: 136 mmol/L (ref 135–145)

## 2024-07-24 LAB — CULTURE, BLOOD (ROUTINE X 2)
Culture: NO GROWTH
Special Requests: ADEQUATE

## 2024-07-24 MED ORDER — GERHARDT'S BUTT CREAM: 1.0000 | TOPICAL_CREAM | Freq: Two times a day (BID) | CUTANEOUS | Status: AC

## 2024-07-24 MED ORDER — TRAZODONE HCL 50 MG PO TABS: 50.0000 mg | ORAL_TABLET | Freq: Every evening | ORAL | 0 refills | Status: AC | PRN

## 2024-07-24 MED ORDER — APIXABAN 5 MG PO TABS: ORAL_TABLET | ORAL | 0 refills | Status: AC

## 2024-07-24 MED ORDER — ROSUVASTATIN CALCIUM 10 MG PO TABS: 10.0000 mg | ORAL_TABLET | Freq: Every day | ORAL | Status: AC

## 2024-07-24 NOTE — Discharge Summary (Signed)
 Physician Discharge Summary  [name] [name]:[id number] DOB: 1945-07-18 DOA: 07/18/2024  PCP: [name] Nottingham, MD  Admit date: 07/18/2024 Discharge date: 07/24/2024  Time spent: 60 minutes  Recommendations for Outpatient Follow-up:  Follow-up with MD at skilled nursing facility.  Patient will need a basic metabolic profile and CBC done in 1 week to follow-up on electrolytes, renal function and counts. Follow-up with [name] Nottingham, MD in 3 weeks or postdischarge from SNF.   Discharge Diagnoses:  Principal Problem:   Pulmonary embolism (HCC) Active Problems:   Dementia without behavioral disturbance (HCC)   Acute deep vein thrombosis (DVT) of calf muscle vein of right lower extremity (HCC)   Ambulatory dysfunction   Acute hypoxemic respiratory failure (HCC)   Discharge Condition: Stable and improved.  Diet recommendation: Regular  Filed Weights   07/19/24 0852 07/23/24 0500 07/24/24 0701  Weight: 115.7 kg 112.7 kg 110.4 kg    History of present illness:  HPI per Dr. Arthea [name] [name] [name] is a 79 y.o. male with medical history significant for advanced dementia, type 2 diabetes mellitus, and hypertension who has had severe weakness for the last 3 days.  The patient has dementia but walks with a walker at baseline.  His wife is his primary caregiver.  He has been very weak and had a lot of trouble walking over the last couple of days.  He was leaning to the right yesterday and family actually brought him into the emergency department for evaluation for stroke.  They spent the entire day pretty much in the emergency department but eventually his MRI was negative for acute stroke and the patient was discharged.  Today the patient was not able to get up out of bed and reportedly he had a temp of 101 so he was brought back in for evaluation. This time the patient was evaluated for possible infection.  His white blood cell count was elevated at 12.5 which is higher than where it was  yesterday at 11.2.  His Tmax in the emergency department was 99.3.  His lactic acid was 1.5.  He was COVID, flu, and RSV negative.  A CT scan of his chest abdomen and pelvis was done.  That revealed a pulmonary embolism in the right lower lobe.  No pneumonia was seen on CT.  Urinalysis had not been collected by the time of this dictation. The patient required 2 L O2 nasal cannula to keep his sats in the 90s so he will be admitted to the hospital.  Hospital Course:  #1 right lower lobe PE with mild respiratory failure requiring 2 L O2 nasal cannula/acute right lower extremity DVT involving right peroneal vein - Patient noted to have presented with worsening weakness, difficulty ambulating, noted to have a fever. - CT chest abdomen and pelvis done was consistent with acute PE in the right lower lobe extending to segmental branches without CT evidence of right heart strain. - Lower extremity Dopplers done with an acute right lower extremity DVT involving the right peroneal vein. - 2D echo ordered and done with a EF of 50 to 55%,NWMA, negative for right ventricular strain. - Patient was on IV heparin  and subsequently transitioned to Eliquis  which she tolerated.     -Patient noted to have been pancultured due to concern for fevers with urine cultures with no growth to date, blood cultures negative.  -Patient received 3 days of IV Rocephin  during the hospitalization which was subsequently discontinued.   - Patient remained afebrile had no further fevers  and will be discharged in stable and improved condition.   - Patient will likely require anywhere from 3 to 6 months of anticoagulation as this is his first episode.   - Outpatient follow-up with PCP.   2.  Advanced dementia - Patient maintained on home regimen Aricept , Namenda , Rexulti .     3.  Hypertension - Patient maintained on home regimen Avapro  during the hospitalization. - Status post Lasix  20 mg IV x 1. -Outpatient follow-up.   4.   Ambulatory dysfunction -Per admitting physician it is noted that patient's wife would like him to return home but needs to be able to ambulate with a walker prior to that as wife unable to lift patient. - PT/OT assessed patient and recommended SNF placement. - Patient was discharged to SNF.   5.  Well-controlled diabetes mellitus type 2 -Hemoglobin A1c 5.6. - CBG noted at 126 on 07/21/2024.  - Per med rec patient noted to be on Ozempic  which was held during the hospitalization and will be resumed on discharge. - Patient maintained on SSI during the hospitalization.   - Outpatient follow-up.       6.  Hypokalemia -Repleted.    Procedures: CT chest abdomen pelvis 07/18/2024 Chest x-ray 07/18/2024 2D echo 07/19/2024 Lower extremity Dopplers 07/19/2024  Consultations: None  Discharge Exam: Vitals:   07/23/24 1949 07/24/24 0520  BP: (!) 152/87 (!) 146/90  Pulse: 72 71  Resp: 18 18  Temp: 98.8 F (37.1 C) 98.5 F (36.9 C)  SpO2: 95% 94%    General: NAD Cardiovascular: RRR no murmurs rubs or gallops.  No JVD.  No lower extremity edema. Respiratory: Clear to auscultation bilaterally.  No wheezes, no crackles, no rhonchi.  Fair air movement.  Speaking in full sentences.  Discharge Instructions   Discharge Instructions     AMB Referral to [facility]rombosis [facility]   Complete by: As directed    Is patient currently on anticoagulation?: Yes   Diet general   Complete by: As directed    Increase activity slowly   Complete by: As directed       Allergies as of 07/24/2024   No Known Allergies      Medication List     PAUSE taking these medications    alfuzosin  10 MG 24 hr tablet Wait to take this until your doctor or other care provider tells you to start again. Commonly known as: UROXATRAL  Take 10 mg by mouth daily.   alfuzosin  10 MG 24 hr tablet Wait to take this until your doctor or other care provider tells you to start again. Commonly known as:  UROXATRAL  Take 1 tablet (10 mg total) by mouth daily.   amLODipine  5 MG tablet Wait to take this until your doctor or other care provider tells you to start again. Commonly known as: NORVASC  Take 1 tablet by mouth daily.   B-12 1000 MCG Tabs Wait to take this until your doctor or other care provider tells you to start again. 3,000 mcg.   TURMERIC-GINGER PO Wait to take this until your doctor or other care provider tells you to start again. Take 1 tablet by mouth daily.   Zinc 50 MG Tabs Wait to take this until your doctor or other care provider tells you to start again. Take 50 mg by mouth daily.       STOP taking these medications    amoxicillin  500 MG capsule Commonly known as: AMOXIL    aspirin EC 81 MG tablet   azithromycin   500 MG tablet Commonly known as: ZITHROMAX    cephALEXin  500 MG capsule Commonly known as: KEFLEX    clotrimazole-betamethasone cream Commonly known as: LOTRISONE   predniSONE  20 MG tablet Commonly known as: DELTASONE        TAKE these medications    acetaminophen  500 MG tablet Commonly known as: TYLENOL  Take 1,000 mg by mouth every 6 (six) hours as needed for moderate pain.   apixaban  5 MG Tabs tablet Commonly known as: ELIQUIS  Take 2 tablets (10 mg total) by mouth 2 (two) times daily for 4 days, THEN 1 tablet (5 mg total) 2 (two) times daily. Start taking on: July 24, 2024   brimonidine  0.2 % ophthalmic solution Commonly known as: ALPHAGAN  Place 1 drop into both eyes 3 (three) times daily.   docusate sodium  100 MG capsule Commonly known as: COLACE Take 200 mg by mouth daily as needed for mild constipation.   donepezil  10 MG tablet Commonly known as: ARICEPT  Take 1 tablet (10 mg total) by mouth at bedtime.   dutasteride  0.5 MG capsule Commonly known as: AVODART  Take 1 capsule (0.5 mg total) by mouth daily.   Gerhardt's butt cream Crea Apply 1 Application topically 2 (two) times daily for 10 days.   irbesartan  75 MG  tablet Commonly known as: AVAPRO  Take 1 tablet (75 mg total) by mouth daily.   loperamide  2 MG capsule Commonly known as: IMODIUM  Take 1 capsule (2 mg total) by mouth every evening.   memantine  10 MG tablet Commonly known as: NAMENDA  Take 1 tablet (10 mg total) by mouth 2 (two) times daily. Start after finishing 5 mg tablets.   multivitamin tablet Take 1 tablet by mouth daily.   Ozempic  (2 MG/DOSE) 8 MG/3ML Sopn Generic drug: Semaglutide  (2 MG/DOSE) Inject 2 mg into the skin once a week. What changed: Another medication with the same name was removed. Continue taking this medication, and follow the directions you see here.   Rexulti  0.5 MG Tabs Generic drug: Brexpiprazole  Take 1 tablet (0.5 mg total) by mouth daily.   Rexulti  1 MG Tabs tablet Generic drug: brexpiprazole  Take 1 tablet (1 mg total) by mouth daily.   rosuvastatin  10 MG tablet Commonly known as: CRESTOR  TAKE 1 TABLET BY MOUTH EVERY DAY Start taking on: August 07, 2024 What changed: These instructions start on August 07, 2024. If you are unsure what to do until then, ask your doctor or other care provider.   sertraline  50 MG tablet Commonly known as: ZOLOFT  Take 1/2  tablet by mouth for 4 days then 1 tablet by mouh each morning   traZODone  50 MG tablet Commonly known as: DESYREL  Take 1 tablet (50 mg total) by mouth at bedtime as needed for sleep. What changed:  when to take this reasons to take this   Vitamin C 500 MG Caps See admin instructions.   Vitamin D3 125 MCG (5000 UT) Tabs Take 5,000 Units by mouth daily.       Allergies[1]  Follow-up Information     MD at SNF Follow up.          [name] Nottingham, MD. Schedule an appointment as soon as possible for a visit in 3 week(s).   Specialty: Internal Medicine Contact information: [address] [place] [address] [place], [place] [address] [phone]                  The results of significant diagnostics from this hospitalization  (including imaging, microbiology, ancillary and laboratory) are listed below for reference.  Significant Diagnostic Studies: ECHOCARDIOGRAM COMPLETE Result Date: 07/19/2024    ECHOCARDIOGRAM REPORT   Patient Name:   [name] [name] Date of Exam: 07/19/2024 Medical Rec #:  [id number]     Height:       72.0 in Accession #:    [id number]    Weight:       255.1 lb Date of Birth:  1945/12/07    BSA:          2.364 m Patient Age:    78 years      BP:           140/91 mmHg Patient Gender: M             HR:           69 bpm. Exam Location:  Inpatient Procedure: 2D Echo, 3D Echo, Cardiac Doppler, Color Doppler and Strain Analysis            (Both Spectral and Color Flow Doppler were utilized during            procedure). Indications:    Pulmonary Embolus  History:        Patient has prior history of Echocardiogram examinations, most                 recent 03/30/2024. CAD, Pulmonary HTN; Risk Factors:Hypertension,                 Diabetes and Sleep Apnea. Aortic Root Dilation with Ascending                 Aortic Aneurysm.  Sonographer:    Philomena Daring Referring Phys: [id number] Gala Padovano V Amarisa Wilinski  Sonographer Comments: Global longitudinal strain was attempted. IMPRESSIONS  1. Left ventricular ejection fraction, by estimation, is 50 to 55%. The left ventricle has low normal function. The left ventricle has no regional wall motion abnormalities. Left ventricular diastolic parameters were normal.  2. Right ventricular systolic function is normal. The right ventricular size is normal.  3. Left atrial size was mildly dilated.  4. The mitral valve is normal in structure. Trivial mitral valve regurgitation. No evidence of mitral stenosis.  5. The aortic valve is tricuspid. Aortic valve regurgitation is not visualized. No aortic stenosis is present.  6. Aortic dilatation noted. There is moderate dilatation of the aortic root, measuring 46 mm. FINDINGS  Left Ventricle: Left ventricular ejection fraction, by estimation, is 50 to 55%. The  left ventricle has low normal function. The left ventricle has no regional wall motion abnormalities. Strain was performed and the global longitudinal strain is indeterminate. The left ventricular internal cavity size was normal in size. There is no left ventricular hypertrophy. Left ventricular diastolic parameters were normal. Right Ventricle: The right ventricular size is normal. Right ventricular systolic function is normal. Left Atrium: Left atrial size was mildly dilated. Right Atrium: Right atrial size was normal in size. Pericardium: There is no evidence of pericardial effusion. Mitral Valve: The mitral valve is normal in structure. Mild mitral annular calcification. Trivial mitral valve regurgitation. No evidence of mitral valve stenosis. Tricuspid Valve: The tricuspid valve is normal in structure. Tricuspid valve regurgitation is trivial. No evidence of tricuspid stenosis. Aortic Valve: The aortic valve is tricuspid. Aortic valve regurgitation is not visualized. No aortic stenosis is present. Pulmonic Valve: The pulmonic valve was normal in structure. Pulmonic valve regurgitation is not visualized. No evidence of pulmonic stenosis. Aorta: Aortic dilatation noted. There is moderate dilatation of the aortic root, measuring 46 mm. Venous:  The inferior vena cava was not well visualized. IAS/Shunts: The interatrial septum was not well visualized.  LEFT VENTRICLE PLAX 2D LVIDd:         4.60 cm   Diastology LVIDs:         3.00 cm   LV e' medial:    8.38 cm/s LV PW:         1.00 cm   LV E/e' medial:  8.7 LV IVS:        1.00 cm   LV e' lateral:   8.70 cm/s LVOT diam:     2.30 cm   LV E/e' lateral: 8.4 LV SV:         94 LV SV Index:   40 LVOT Area:     4.15 cm LV IVRT:       100 msec                          3D Volume EF:                          3D EF:        60 %                          LV EDV:       200 ml                          LV ESV:       80 ml                          LV SV:        120 ml RIGHT VENTRICLE  RV Basal diam:  3.10 cm RV Mid diam:    2.70 cm RV S [name]:     9.14 cm/s TAPSE (M-mode): 2.2 cm LEFT ATRIUM             Index        RIGHT ATRIUM           Index LA diam:        4.20 cm 1.78 cm/m   RA Area:     19.30 cm LA Vol (A2C):   92.3 ml 39.05 ml/m  RA Volume:   49.90 ml  21.11 ml/m LA Vol (A4C):   85.3 ml 36.09 ml/m LA Biplane Vol: 90.3 ml 38.21 ml/m  AORTIC VALVE LVOT Vmax:   111.00 cm/s LVOT Vmean:  71.600 cm/s LVOT VTI:    0.226 m  AORTA Ao Root diam: 3.80 cm Ao Asc diam:  3.80 cm MITRAL VALVE MV Area (PHT): 4.12 cm    SHUNTS MV Decel Time: 184 msec    Systemic VTI:  0.23 m MV E velocity: 72.80 cm/s  Systemic Diam: 2.30 cm MV A velocity: 74.70 cm/s MV E/A ratio:  0.97 Redell Shallow MD Electronically signed by Redell Shallow MD Signature Date/Time: 07/19/2024/3:29:51 PM    Final    VAS US  LOWER EXTREMITY VENOUS (DVT) Result Date: 07/19/2024  Lower Venous DVT Study Patient Name:  IGNATIUS KLOOS Schimming  Date of Exam:   07/19/2024 Medical Rec #: [id number]      Accession #:    [id number] Date of Birth: July 13, 1945     Patient Gender: M Patient Age:   79 years Exam Location:  [facility]  Procedure:      VAS US  LOWER EXTREMITY VENOUS (DVT) Referring Phys: TORIBIO HUMMER --------------------------------------------------------------------------------  Indications: Pulmonary embolism.  Comparison Study: No previous exams Performing Technologist: Jody Hill RVT, RDMS  Examination Guidelines: A complete evaluation includes B-mode imaging, spectral Doppler, color Doppler, and power Doppler as needed of all accessible portions of each vessel. Bilateral testing is considered an integral part of a complete examination. Limited examinations for reoccurring indications may be performed as noted. The reflux portion of the exam is performed with the patient in reverse Trendelenburg.  +---------+---------------+---------+-----------+----------+--------------+ RIGHT     CompressibilityPhasicitySpontaneityPropertiesThrombus Aging +---------+---------------+---------+-----------+----------+--------------+ CFV      Full           Yes      Yes                                 +---------+---------------+---------+-----------+----------+--------------+ SFJ      Full                                                        +---------+---------------+---------+-----------+----------+--------------+ FV Prox  Full           Yes      Yes                                 +---------+---------------+---------+-----------+----------+--------------+ FV Mid   Full           Yes      Yes                                 +---------+---------------+---------+-----------+----------+--------------+ FV DistalFull           Yes      Yes                                 +---------+---------------+---------+-----------+----------+--------------+ PFV      Full                                                        +---------+---------------+---------+-----------+----------+--------------+ POP      Full           Yes      Yes                                 +---------+---------------+---------+-----------+----------+--------------+ PTV      Full                                                        +---------+---------------+---------+-----------+----------+--------------+ PERO     None           No       No  Acute          +---------+---------------+---------+-----------+----------+--------------+   +---------+---------------+---------+-----------+----------+------------------+ LEFT     CompressibilityPhasicitySpontaneityPropertiesThrombus Aging     +---------+---------------+---------+-----------+----------+------------------+ CFV      Full           Yes      Yes                                     +---------+---------------+---------+-----------+----------+------------------+ SFJ      Full                                                             +---------+---------------+---------+-----------+----------+------------------+ FV Prox  Full           Yes      Yes                                     +---------+---------------+---------+-----------+----------+------------------+ FV Mid   Full           Yes      Yes                                     +---------+---------------+---------+-----------+----------+------------------+ FV DistalFull           Yes      Yes                                     +---------+---------------+---------+-----------+----------+------------------+ PFV      Full                                                            +---------+---------------+---------+-----------+----------+------------------+ POP      Full           Yes      Yes                                     +---------+---------------+---------+-----------+----------+------------------+ PTV      None           No       No                   Acute - one of                                                           paired             +---------+---------------+---------+-----------+----------+------------------+ PERO     None           No       No  Acute              +---------+---------------+---------+-----------+----------+------------------+ Soleal   None           No       No                   Acute              +---------+---------------+---------+-----------+----------+------------------+ Gastroc  None           No       No                   Acute              +---------+---------------+---------+-----------+----------+------------------+     Summary: RIGHT: - Findings consistent with acute deep vein thrombosis involving the right peroneal veins.  - There is no evidence of deep vein thrombosis in the lower extremity.  - No cystic structure found in the popliteal fossa.  LEFT: - Findings consistent with acute deep vein thrombosis involving  the left peroneal veins, and left posterior tibial veins. Findings consistent with acute intramuscular thrombosis involving the left soleal veins, and left gastrocnemius veins. - A cystic structure is found in the popliteal fossa.  *See table(s) above for measurements and observations. Electronically signed by Lonni Gaskins MD on 07/19/2024 at 1:43:23 PM.    Final    CT CHEST ABDOMEN PELVIS W CONTRAST Result Date: 07/18/2024 EXAM: CT CHEST WITH CONTRAST 07/18/2024 08:42:25 PM TECHNIQUE: CT of the chest was performed with the administration of 100 mL iohexol  (OMNIPAQUE ) 300 MG/ML solution. Multiplanar reformatted images are provided for review. Automated exposure control, iterative reconstruction, and/or weight based adjustment of the mA/kV was utilized to reduce the radiation dose to as low as reasonably achievable. COMPARISON: 07/16/2023 and 10/07/2022. CLINICAL HISTORY: Sepsis, Leukocytosis, generalized weakness. FINDINGS: MEDIASTINUM: Heart: Extensive multivessel coronary artery calcifications. No CT evidence of right heart strain, however. Pericardium is unremarkable. The central airways are clear. Vessels: Intraluminal filling defect is identified within the right lower lobar pulmonary artery branching into several segmental pulmonary arteries in keeping with an acute pulmonary embolus. The central pulmonary arteries are enlarged in keeping with changes of pulmonary arterial hypertension. Mild atherosclerotic calcification within the thoracic aorta. No aortic aneurysm. LYMPH NODES: No mediastinal, hilar or axillary lymphadenopathy. LUNGS AND PLEURA: Small left basilar atelectasis. No focal consolidation or pulmonary edema. No pleural effusion or pneumothorax. SOFT TISSUES/BONES: Osseous structures are age appropriate. No acute bone abnormality. No lytic or blastic bone lesion. No acute abnormality of the soft tissues. UPPER ABDOMEN: Limited images of the upper abdomen demonstrate a small hiatal hernia.  Cholelithiasis is present. The gallbladder is distended. No superimposed pericholecystic inflammatory changes are identified, however, to suggest changes of acute cholecystitis. Circumferential rectal wall thickening and mild presacral edema may reflect changes of an infectious or inflammatory proctitis. No evidence of obstruction or perforation. Mild prostatic hypertrophy. Moderate descending and sigmoid colonic diverticulosis without superimposed acute inflammatory change. The appendix is normal. The stomach, small bowel, and large bowel are otherwise unremarkable. Mild aortoiliac atherosclerotic calcification. IMPRESSION: 1. Acute pulmonary embolus in the right lower lobar pulmonary artery extending into segmental branches, without CT evidence of right heart strain. 2. Enlarged central pulmonary arteries, compatible with pulmonary arterial hypertension. 3. Circumferential rectal wall thickening and mild presacral edema, which may reflect infectious or inflammatory proctitis, without obstruction or perforation. 4. Cholelithiasis with gallbladder distention, without CT evidence of acute cholecystitis. 5. Extensive multivessel coronary artery calcifications.  6. Moderate descending and sigmoid colonic diverticulosis, without acute diverticulitis. 7. Small hiatal hernia. 8. RAF score aortic atherosclerosis (ICD10-I70.0). Electronically signed by: Dorethia Molt MD 07/18/2024 08:51 PM EST RP Workstation: [id number]   DG Chest Port 1 View Result Date: 07/18/2024 CLINICAL DATA:  Abnormal labs elevated white count EXAM: PORTABLE CHEST 1 VIEW COMPARISON:  10/04/2022 FINDINGS: Hypoventilatory changes with bronchovascular crowding. Cardiomegaly. Bibasilar atelectasis. No pneumothorax IMPRESSION: Hypoventilatory changes with bronchovascular crowding and bibasilar atelectasis. Cardiomegaly. Electronically Signed   By: Luke Bun M.D.   On: 07/18/2024 18:21   CT HEAD WO CONTRAST Result Date: 07/17/2024 CLINICAL DATA:   Dementia, right-sided weakness EXAM: CT HEAD WITHOUT CONTRAST TECHNIQUE: Contiguous axial images were obtained from the base of the skull through the vertex without intravenous contrast. RADIATION DOSE REDUCTION: This exam was performed according to the departmental dose-optimization program which includes automated exposure control, adjustment of the mA and/or kV according to patient size and/or use of iterative reconstruction technique. COMPARISON:  MRI 04/01/2022 FINDINGS: CT HEAD: There is cerebral atrophy. Old lacunar infarct in the head of the caudate nucleus on the right. There is no hemorrhage. No acute ischemic changes. No mass lesion. The ventricles are normal. Skull/sinuses/orbits: No significant abnormality. IMPRESSION: Cerebral atrophy. Old lacunar infarct in the right caudate nucleus. No acute abnormality. Electronically Signed   By: Nancyann Burns M.D.   On: 07/17/2024 15:53   MR BRAIN WO CONTRAST Result Date: 07/17/2024 CLINICAL DATA:  Neuro deficit, acute stroke suspected EXAM: MRI HEAD WITHOUT CONTRAST TECHNIQUE: Multiplanar, multiecho pulse sequences of the brain and surrounding structures were obtained without intravenous contrast. COMPARISON:  April 01, 2022 FINDINGS: MRI brain: There is a small old infarct in the medial left occipital lobe with encephalomalacia. Old lacunar infarct in the right caudate nucleus. Old lacunar infarct in the right-side of the cerebellum. No acute infarct. The ventricles are normal. No mass lesion. There are normal flow signals in the carotid arteries and basilar artery. No significant bone marrow signal abnormality. No significant abnormality in the paranasal sinuses or soft tissues. IMPRESSION: 1. 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 Old lacunar infarcts in the right cerebellum and right caudate nucleus 3. No acute infarct 4. No change from 04/01/2022 Electronically Signed   By: Nancyann Burns M.D.   On: 07/17/2024 15:33    Microbiology: Recent  Results (from the past 240 hours)  Resp panel by RT-PCR (RSV, Flu A&B, Covid) Anterior Nasal Swab     Status: None   Collection Time: 07/18/24  6:05 PM   Specimen: Anterior Nasal Swab  Result Value Ref Range Status   SARS Coronavirus 2 by RT PCR NEGATIVE NEGATIVE Final    Comment: (NOTE) SARS-CoV-2 target nucleic acids are NOT DETECTED.  The SARS-CoV-2 RNA is generally detectable in upper respiratory specimens during the acute phase of infection. The lowest concentration of SARS-CoV-2 viral copies this assay can detect is 138 copies/mL. A negative result does not preclude SARS-Cov-2 infection and should not be used as the sole basis for treatment or other patient management decisions. A negative result may occur with  improper specimen collection/handling, submission of specimen other than nasopharyngeal swab, presence of viral mutation(s) within the areas targeted by this assay, and inadequate number of viral copies(<138 copies/mL). A negative result must be combined with clinical observations, patient history, and epidemiological information. The expected result is Negative.  Fact Sheet for Patients:  bloggercourse.com  Fact Sheet for Healthcare Providers:  seriousbroker.it  This test is no  t yet approved or cleared by the [place]  FDA and  has been authorized for detection and/or diagnosis of SARS-CoV-2 by FDA under an Emergency Use Authorization (EUA). This EUA will remain  in effect (meaning this test can be used) for the duration of the COVID-19 declaration under Section 564(b)(1) of the Act, 21 U.S.C.section 360bbb-3(b)(1), unless the authorization is terminated  or revoked sooner.       Influenza A by PCR NEGATIVE NEGATIVE Final   Influenza B by PCR NEGATIVE NEGATIVE Final    Comment: (NOTE) The Xpert Xpress SARS-CoV-2/FLU/RSV plus assay is intended as an aid in the diagnosis of influenza from Nasopharyngeal swab  specimens and should not be used as a sole basis for treatment. Nasal washings and aspirates are unacceptable for Xpert Xpress SARS-CoV-2/FLU/RSV testing.  Fact Sheet for Patients: bloggercourse.com  Fact Sheet for Healthcare Providers: seriousbroker.it  This test is not yet approved or cleared by the [place]  FDA and has been authorized for detection and/or diagnosis of SARS-CoV-2 by FDA under an Emergency Use Authorization (EUA). This EUA will remain in effect (meaning this test can be used) for the duration of the COVID-19 declaration under Section 564(b)(1) of the Act, 21 U.S.C. section 360bbb-3(b)(1), unless the authorization is terminated or revoked.     Resp Syncytial Virus by PCR NEGATIVE NEGATIVE Final    Comment: (NOTE) Fact Sheet for Patients: bloggercourse.com  Fact Sheet for Healthcare Providers: seriousbroker.it  This test is not yet approved or cleared by the [place]  FDA and has been authorized for detection and/or diagnosis of SARS-CoV-2 by FDA under an Emergency Use Authorization (EUA). This EUA will remain in effect (meaning this test can be used) for the duration of the COVID-19 declaration under Section 564(b)(1) of the Act, 21 U.S.C. section 360bbb-3(b)(1), unless the authorization is terminated or revoked.  Performed at [facility], [address], [place], [place] [address]   Blood Culture (routine x 2)     Status: None   Collection Time: 07/18/24  6:11 PM   Specimen: Right Antecubital; Blood  Result Value Ref Range Status   Specimen Description   Final    RIGHT ANTECUBITAL Performed at [facility], [address] [place], [place] [address]    Special Requests   Final    BOTTLES DRAWN AEROBIC AND ANAEROBIC Blood Culture adequate volume Performed at [facility], [address]  [place], [place] [address]    Culture   Final    NO GROWTH 5 DAYS Performed at [facility], [address], [place], [place] [address]    Report Status 07/23/2024 FINAL  Final  Urine Culture     Status: None   Collection Time: 07/19/24  2:01 AM   Specimen: Urine, Random  Result Value Ref Range Status   Specimen Description   Final    URINE, RANDOM Performed at [facility], [address] [place], [place] [address]    Special Requests   Final    NONE Reflexed from [id number] Performed at [facility], [address], [place], [place] [address]    Culture   Final    NO GROWTH Performed at [facility], [address], [place], [place] [address]    Report Status 07/20/2024 FINAL  Final  Blood Culture (routine x 2)     Status: None   Collection Time: 07/19/24  5:51 PM   Specimen: BLOOD RIGHT HAND  Result  Value Ref Range Status   Specimen Description   Final    BLOOD RIGHT HAND Performed at [facility], [address], [place], [place] [address]    Special Requests   Final    BOTTLES DRAWN AEROBIC AND ANAEROBIC Blood Culture adequate volume Performed at [facility], [address], [place], [place] [address]    Culture   Final    NO GROWTH 5 DAYS Performed at [facility], [address], [place], [place] [address]    Report Status 07/24/2024 FINAL  Final     Labs: Basic Metabolic Panel: Recent Labs  Lab 07/19/24 0540 07/20/24 0642 07/21/24 0706 07/22/24 0633 07/23/24 0619 07/24/24 0533  NA 142 138 137 137 135 136  K 3.3* 3.7 3.4* 4.0 3.6 3.9  CL 106 107 102 101 100 101  CO2 27 23 28 25 26 26   GLUCOSE 126* 116* 123* 131* 126* 107*  BUN 13 11 7* 7* 11 11  CREATININE 0.52* 0.46* 0.45* 0.49* 0.54* 0.52*  CALCIUM  8.6* 8.5* 8.4* 8.9 8.7* 8.7*  MG 2.0 2.1  --  2.0  --   --   PHOS  --  2.6  --   --   --   --    Liver Function Tests: Recent Labs  Lab 07/17/24 1438 07/18/24 1829  07/20/24 0642  AST 22 18  --   ALT 10 12  --   ALKPHOS 79 75  --   BILITOT 1.0 0.8  --   PROT 7.5 7.0  --   ALBUMIN 4.1 3.9 3.0*   No results for input(s): LIPASE, AMYLASE in the last 168 hours. No results for input(s): AMMONIA in the last 168 hours. CBC: Recent Labs  Lab 07/17/24 1438 07/18/24 1829 07/19/24 0540 07/20/24 0642 07/22/24 0633 07/23/24 0619 07/24/24 0533  WBC 11.2* 12.5* 10.4 10.5 9.1 9.5 9.6  NEUTROABS 7.9* 9.8*  --   --  6.3  --   --   HGB 13.5 12.8* 11.2* 11.2* 12.8* 12.4* 12.1*  HCT 41.8 38.9* 33.5* 33.0* 38.1* 36.4* 35.7*  MCV 98.6 98.2 96.8 97.6 93.6 94.1 94.7  PLT 161 148* 135* 137* 221 228 242   Cardiac Enzymes: No results for input(s): CKTOTAL, CKMB, CKMBINDEX, TROPONINI in the last 168 hours. BNP: BNP (last 3 results) No results for input(s): BNP in the last 8760 hours.  ProBNP (last 3 results) No results for input(s): PROBNP in the last 8760 hours.  CBG: Recent Labs  Lab 07/20/24 1728 07/20/24 2108 07/21/24 0743 07/21/24 1211 07/21/24 1632  GLUCAP 144* 110* 126* 138* 125*       Signed:  Toribio Hummer MD.  [facility] 07/24/2024, 12:10 PM       [1] No Known Allergies

## 2024-07-24 NOTE — TOC Progression Note (Addendum)
 Transition of Care [facility]) - Progression Note    Patient Details  Name: [name] [name] MRN: [id number] Date of Birth: 1945/11/23  Transition of Care [facility]) CM/SW Contact  Toy LITTIE Agar, RN Phone Number:[phone]  07/24/2024, 1:13 PM  Clinical Narrative:    Cm at bedside to present offers. Wife agreeable to [employer] garden. CM is currently awaiting PASRR#. PASSR currently under manuel review. CM will arrange transport once PASRR is approved.   1449 PASRR pending manuel review.   1631 PASRR remains pending. Will follow up in am.    Expected Discharge Plan: Skilled Nursing Facility Barriers to Discharge: No Barriers Identified               Expected Discharge Plan and Services In-house Referral: NA Discharge Planning Services: CM Consult Post Acute Care Choice: Skilled Nursing Facility Living arrangements for the past 2 months: Single Family Home Expected Discharge Date: 07/24/24               DME Arranged: N/A DME Agency: NA       HH Arranged: NA HH Agency: NA         Social Drivers of Health (SDOH) Interventions SDOH Screenings   Food Insecurity: No Food Insecurity (07/19/2024)  Housing: Low Risk (07/19/2024)  Transportation Needs: No Transportation Needs (07/19/2024)  Utilities: Not At Risk (07/19/2024)  Social Connections: Unknown (07/19/2024)  Tobacco Use: Medium Risk (07/18/2024)    Readmission Risk Interventions    07/21/2024    3:21 PM  Readmission Risk Prevention Plan  Transportation Screening Complete  PCP or Specialist Appt within 5-7 Days Complete  Home Care Screening Complete  Medication Review (RN CM) Complete

## 2024-07-24 NOTE — Plan of Care (Signed)
" °  Problem: Skin Integrity: Goal: Risk for impaired skin integrity will decrease Outcome: Progressing   Problem: Tissue Perfusion: Goal: Adequacy of tissue perfusion will improve Outcome: Progressing   Problem: Clinical Measurements: Goal: Will remain free from infection Outcome: Progressing   Problem: Coping: Goal: Level of anxiety will decrease Outcome: Progressing   Problem: Pain Managment: Goal: General experience of comfort will improve and/or be controlled Outcome: Progressing   Problem: Safety: Goal: Ability to remain free from injury will improve Outcome: Progressing   Problem: Activity: Goal: Risk for activity intolerance will decrease Outcome: Not Progressing   "

## 2024-07-24 NOTE — Plan of Care (Signed)

## 2024-07-25 DIAGNOSIS — I2694 Multiple subsegmental pulmonary emboli without acute cor pulmonale: Secondary | ICD-10-CM | POA: Diagnosis not present

## 2024-07-25 DIAGNOSIS — R262 Difficulty in walking, not elsewhere classified: Secondary | ICD-10-CM | POA: Diagnosis not present

## 2024-07-25 DIAGNOSIS — I82461 Acute embolism and thrombosis of right calf muscular vein: Secondary | ICD-10-CM | POA: Diagnosis not present

## 2024-07-25 DIAGNOSIS — J9601 Acute respiratory failure with hypoxia: Secondary | ICD-10-CM | POA: Diagnosis not present

## 2024-07-25 DIAGNOSIS — F0390 Unspecified dementia without behavioral disturbance: Secondary | ICD-10-CM | POA: Diagnosis not present

## 2024-07-25 NOTE — TOC Progression Note (Addendum)
 Transition of Care [facility]) - Progression Note    Patient Details  Name: [name] [name] MRN: [id number] Date of Birth: December 05, 1945  Transition of Care [facility]) CM/SW Contact  Toy LITTIE Agar, RN Phone Number:[phone]  07/25/2024, 8:55 AM  Clinical Narrative:    PASRR still pending manual review.   1230 Update requested for SS# on FL2. This information has been corrected and uploaded. PASRR remains in manuel review.    Expected Discharge Plan: Skilled Nursing Facility Barriers to Discharge: No Barriers Identified               Expected Discharge Plan and Services In-house Referral: NA Discharge Planning Services: CM Consult Post Acute Care Choice: Skilled Nursing Facility Living arrangements for the past 2 months: Single Family Home Expected Discharge Date: 07/24/24               DME Arranged: N/A DME Agency: NA       HH Arranged: NA HH Agency: NA         Social Drivers of Health (SDOH) Interventions SDOH Screenings   Food Insecurity: No Food Insecurity (07/19/2024)  Housing: Low Risk (07/19/2024)  Transportation Needs: No Transportation Needs (07/19/2024)  Utilities: Not At Risk (07/19/2024)  Social Connections: Unknown (07/19/2024)  Tobacco Use: Medium Risk (07/18/2024)    Readmission Risk Interventions    07/21/2024    3:21 PM  Readmission Risk Prevention Plan  Transportation Screening Complete  PCP or Specialist Appt within 5-7 Days Complete  Home Care Screening Complete  Medication Review (RN CM) Complete

## 2024-07-25 NOTE — NC FL2 (Addendum)
 " [place]  MEDICAID FL2 LEVEL OF CARE FORM     IDENTIFICATION  Patient Name: [name] [name] Birthdate: 03-19-1946 Sex: male Admission Date (Current Location): 07/18/2024  [facility] and [place] Number:  [employer] Television/film/video and Address:  [facility],  [address] [place], [place] [address]      Provider Number: [id number]  Attending Physician Name and Address:  Sebastian Toribio GAILS, MD  Relative Name and Phone Number:  Odysseus Cada [phone]    Current Level of Care: SNF Recommended Level of Care: Skilled Nursing Facility Prior Approval Number:    Date Approved/Denied:   PASRR Number: PASRR pending  Discharge Plan: SNF    Current Diagnoses: Patient Active Problem List   Diagnosis Date Noted   Acute hypoxemic respiratory failure (HCC) 07/24/2024   Dementia without behavioral disturbance (HCC) 07/19/2024   Acute deep vein thrombosis (DVT) of calf muscle vein of right lower extremity (HCC) 07/19/2024   Ambulatory dysfunction 07/19/2024   Pulmonary embolism (HCC) 07/18/2024   Aortic dilatation    Nonrheumatic mitral valve regurgitation    Aortic root dilation 07/25/2018   Aneurysm, ascending aorta 07/25/2018   Essential hypertension 07/25/2018   DM2 (diabetes mellitus, type 2) (HCC) 07/25/2018   Spinal stenosis of lumbar region with neurogenic claudication 07/25/2018   OSA on CPAP 07/25/2018   Hemorrhoids 07/26/2013   Left Ureteral stone 07/18/2011   Pyelonephritis 07/18/2011   Anal fissure 06/17/2011   History of laparoscopic adjustable gastric banding 06/17/2011    Orientation RESPIRATION BLADDER Height & Weight     Self  Normal Incontinent Weight: 110.4 kg Height:  6' (182.9 cm)  BEHAVIORAL SYMPTOMS/MOOD NEUROLOGICAL BOWEL NUTRITION STATUS     (n/a) Incontinent Diet (Regular diet)  AMBULATORY STATUS COMMUNICATION OF NEEDS Skin   Total Care Verbally Other (Comment) (redness noted to bilateral buttocks)                       Personal Care  Assistance Level of Assistance  Bathing, Feeding, Dressing Bathing Assistance: Maximum assistance Feeding assistance: Limited assistance Dressing Assistance: Maximum assistance     Functional Limitations Info  Sight, Hearing, Speech Sight Info: Adequate Hearing Info: Impaired Speech Info: Adequate    SPECIAL CARE FACTORS FREQUENCY  PT (By licensed PT), OT (By licensed OT)     PT Frequency: 5x/wk OT Frequency: 5x/wk            Contractures Contractures Info: Not present    Additional Factors Info  Code Status, Allergies, Psychotropic, Insulin  Sliding Scale, Isolation Precautions, Suctioning Needs Code Status Info: DNR Allergies Info: No known drug allergies Psychotropic Info: see d/c summary Insulin  Sliding Scale Info: see d/c summary Isolation Precautions Info: n/a Suctioning Needs: n/a   Current Medications (07/25/2024):  This is the current hospital active medication list Current Facility-Administered Medications  Medication Dose Route Frequency Provider Last Rate Last Admin   acetaminophen  (TYLENOL ) tablet 650 mg  650 mg Oral Q6H PRN Claiborne, Claudia, MD   650 mg at 07/23/24 2146   Or   acetaminophen  (TYLENOL ) suppository 650 mg  650 mg Rectal Q6H PRN Arthea Child, MD       apixaban  (ELIQUIS ) tablet 10 mg  10 mg Oral BID Sebastian Toribio GAILS, MD   10 mg at 07/25/24 0935   Followed by   NOREEN ON 07/28/2024] apixaban  (ELIQUIS ) tablet 5 mg  5 mg Oral BID Sebastian Toribio GAILS, MD       bisacodyl  (DULCOLAX) EC tablet 5 mg  5 mg Oral Daily PRN Claiborne, Claudia, MD       brexpiprazole  (REXULTI ) tablet 1 mg  1 mg Oral Daily Sebastian Toribio GAILS, MD   1 mg at 07/25/24 0935   brimonidine  (ALPHAGAN ) 0.2 % ophthalmic solution 1 drop  1 drop Both Eyes TID Sebastian Toribio GAILS, MD   1 drop at 07/25/24 9063   donepezil  (ARICEPT ) tablet 10 mg  10 mg Oral QHS Claiborne, Claudia, MD   10 mg at 07/25/24 0935   dutasteride  (AVODART ) capsule 0.5 mg  0.5 mg Oral Daily Claiborne, Claudia,  MD   0.5 mg at 07/25/24 0934   Gerhardt's butt cream   Topical BID Sebastian Toribio GAILS, MD   Given at 07/25/24 [phone]   irbesartan  (AVAPRO ) tablet 75 mg  75 mg Oral Daily Claiborne, Claudia, MD   75 mg at 07/25/24 0935   magnesium  hydroxide (MILK OF MAGNESIA) suspension 30 mL  30 mL Oral Once Arthea Child, MD       memantine  (NAMENDA ) tablet 10 mg  10 mg Oral BID Claiborne, Claudia, MD   10 mg at 07/25/24 9063   ondansetron  (ZOFRAN ) tablet 4 mg  4 mg Oral Q6H PRN Arthea Child, MD       Or   ondansetron  (ZOFRAN ) injection 4 mg  4 mg Intravenous Q6H PRN Claiborne, Claudia, MD       Oral care mouth rinse  15 mL Mouth Rinse 4 times per day Sebastian Toribio GAILS, MD   15 mL at 07/25/24 0800   Oral care mouth rinse  15 mL Mouth Rinse PRN Sebastian Toribio GAILS, MD       sertraline  (ZOLOFT ) tablet 100 mg  100 mg Oral Daily Claiborne, Claudia, MD   100 mg at 07/25/24 9063   sodium chloride  flush (NS) 0.9 % injection 3 mL  3 mL Intravenous Q12H Arthea Child, MD   3 mL at 07/25/24 1000     Discharge Medications: Please see discharge summary for a list of discharge medications.  Relevant Imaging Results:  Relevant Lab Results:   Additional Information SS# [id number]  Toy LITTIE Agar, RN     "

## 2024-07-25 NOTE — Progress Notes (Signed)
 " PROGRESS NOTE    [name] [name]  FMW:[id number] DOB: 1946/04/21 DOA: 07/18/2024 PCP: Clarice Nottingham, MD    Chief Complaint  Patient presents with   Weakness    Brief Narrative:  Patient 79 year old gentleman history of advanced dementia, type 2 diabetes, hypertension presented with severe weakness x 3 days.  Patient noted at baseline with the use of a walker, wife is primary caregiver however patient noted to have been significantly weak with difficulty ambulating over the past couple of days.  Patient noted to be leaning to the right side and he was brought to the ED on 07/17/2024 where MRI of the brain was done which was negative for any acute infarcts.  Patient noted to have a fever of 101 and subsequently brought to the ED.  CT chest abdomen and pelvis was done concerning for right lower lobe pulmonary embolism.  Patient placed on heparin  drip.  Patient also placed empirically on IV antibiotics pending infectious workup.     Assessment & Plan:   Principal Problem:   Pulmonary embolism (HCC) Active Problems:   Dementia without behavioral disturbance (HCC)   Acute deep vein thrombosis (DVT) of calf muscle vein of right lower extremity (HCC)   Ambulatory dysfunction   Acute hypoxemic respiratory failure (HCC)  #1 right lower lobe PE with mild respiratory failure requiring 2 L O2 nasal cannula/acute right lower extremity DVT involving right peroneal vein - Patient noted to have presented with worsening weakness, difficulty ambulating, noted to have a fever. - CT chest abdomen and pelvis done was consistent with acute PE in the right lower lobe extending to segmental branches without CT evidence of right heart strain. - Lower extremity Dopplers done with an acute right lower extremity DVT involving the right peroneal vein. - 2D echo ordered and done with a EF of 50 to 55%,NWMA, negative for right ventricular strain. - Patient was on IV heparin  and has been transitioned to Eliquis .      -Patient noted to have been pancultured due to concern for fevers with urine cultures with no growth to date, blood cultures negative x 5 days. -Status post 3 days IV Rocephin  which has been discontinued. - Patient remains afebrile.  - PT/OT.   2.  Advanced dementia - Continue Namenda , Aricept , Rexulti .    3.  Hypertension - Continue Avapro .  - Status post Lasix  20 mg IV x 1.   4.  Ambulatory dysfunction -Per admitting physician it is noted that patient's wife would like him to return home but needs to be able to ambulate with a walker prior to that as wife unable to lift patient. - PT/OT assessed patient and recommended SNF placement   5.  Well-controlled diabetes mellitus type 2 -Hemoglobin A1c 5.6. - Glucose on labs from 07/24/2024 of 107. - Per med rec patient noted to be on Ozempic . - SSI has been discontinued.    6.  Hypokalemia -Repleted.      DVT prophylaxis: Heparin  drip>>> Eliquis  Code Status: DNR Family Communication: Updated wife, and son at bedside. Disposition: Patient medically stable as of 07/24/2024.  Awaiting SNF placement.   Status is: Inpatient Remains inpatient appropriate because: Severity of illness   Consultants:  None  Procedures:  CT chest abdomen pelvis 07/18/2024 Chest x-ray 07/18/2024 2D echo 07/19/2024 Lower extremity Dopplers 07/19/2024  Antimicrobials:  Anti-infectives (From admission, onward)    Start     Dose/Rate Route Frequency Ordered Stop   07/19/24 0800  cefTRIAXone  (ROCEPHIN ) 2 g in sodium chloride   0.9 % 100 mL IVPB  Status:  Discontinued        2 g 200 mL/hr over 30 Minutes Intravenous Every 24 hours 07/19/24 0043 07/21/24 0743   07/18/24 1930  azithromycin  (ZITHROMAX ) tablet 500 mg  Status:  Discontinued        500 mg Oral Daily 07/18/24 1916 07/19/24 0043   07/18/24 1915  cefTRIAXone  (ROCEPHIN ) 1 g in sodium chloride  0.9 % 100 mL IVPB        1 g 200 mL/hr over 30 Minutes Intravenous  Once 07/18/24 1906 07/18/24 2117    07/18/24 1915  doxycycline (VIBRAMYCIN) 100 mg in sodium chloride  0.9 % 250 mL IVPB  Status:  Discontinued        100 mg 125 mL/hr over 120 Minutes Intravenous Every 12 hours 07/18/24 1906 07/18/24 1916         Subjective: Patient laying in bed. Denies any chest pain or shortness of breath.  No abdominal pain.  No bleeding.  Wife and son at bedside.  Patient sleeping, easily arousable.  Denies any chest pain or shortness of breath.  Denies any abdominal pain.  No bleeding noted.  Wife and son at bedside.   Objective: Vitals:   07/24/24 0701 07/24/24 1454 07/24/24 2056 07/25/24 0426  BP:  118/78 (!) 145/87 (!) 153/98  Pulse:  (!) 58 67 70  Resp:  18 18 18   Temp:   98 F (36.7 C) 98.1 F (36.7 C)  TempSrc:   Oral Oral  SpO2:  95% 94% 96%  Weight: 110.4 kg     Height:        Intake/Output Summary (Last 24 hours) at 07/25/2024 1021 Last data filed at 07/25/2024 0900 Gross per 24 hour  Intake 360 ml  Output 1550 ml  Net -1190 ml   Filed Weights   07/19/24 0852 07/23/24 0500 07/24/24 0701  Weight: 115.7 kg 112.7 kg 110.4 kg    Examination:  General exam: NAD Respiratory system: CTAB anterior lung fields.  No wheezes, no crackles, no rhonchi.  Fair air movement.  Speaking in full sentences.  Cardiovascular system: Regular rate rhythm no murmurs rubs or gallops.  No JVD.  No lower extremity edema. Gastrointestinal system: Abdomen is soft, nontender, nondistended, positive bowel sounds.  No rebound.  No guarding.   Central nervous system: Alert.  Moving extremities spontaneously.  No focal neurological deficits. Extremities: Symmetric 5 x 5 power. Skin: No rashes, lesions or ulcers Psychiatry: Judgement and insight appear poor to fair.. Mood & affect appropriate.     Data Reviewed: I have personally reviewed following labs and imaging studies  CBC: Recent Labs  Lab 07/18/24 1829 07/19/24 0540 07/20/24 0642 07/22/24 0633 07/23/24 0619 07/24/24 0533  WBC 12.5* 10.4  10.5 9.1 9.5 9.6  NEUTROABS 9.8*  --   --  6.3  --   --   HGB 12.8* 11.2* 11.2* 12.8* 12.4* 12.1*  HCT 38.9* 33.5* 33.0* 38.1* 36.4* 35.7*  MCV 98.2 96.8 97.6 93.6 94.1 94.7  PLT 148* 135* 137* 221 228 242    Basic Metabolic Panel: Recent Labs  Lab 07/19/24 0540 07/20/24 0642 07/21/24 0706 07/22/24 0633 07/23/24 0619 07/24/24 0533  NA 142 138 137 137 135 136  K 3.3* 3.7 3.4* 4.0 3.6 3.9  CL 106 107 102 101 100 101  CO2 27 23 28 25 26 26   GLUCOSE 126* 116* 123* 131* 126* 107*  BUN 13 11 7* 7* 11 11  CREATININE 0.52* 0.46* 0.45* 0.49*  0.54* 0.52*  CALCIUM  8.6* 8.5* 8.4* 8.9 8.7* 8.7*  MG 2.0 2.1  --  2.0  --   --   PHOS  --  2.6  --   --   --   --     GFR: Estimated Creatinine Clearance: 94.5 mL/min (A) (by C-G formula based on SCr of 0.52 mg/dL (L)).  Liver Function Tests: Recent Labs  Lab 07/18/24 1829 07/20/24 0642  AST 18  --   ALT 12  --   ALKPHOS 75  --   BILITOT 0.8  --   PROT 7.0  --   ALBUMIN 3.9 3.0*    CBG: Recent Labs  Lab 07/20/24 1728 07/20/24 2108 07/21/24 0743 07/21/24 1211 07/21/24 1632  GLUCAP 144* 110* 126* 138* 125*     Recent Results (from the past 240 hours)  Resp panel by RT-PCR (RSV, Flu A&B, Covid) Anterior Nasal Swab     Status: None   Collection Time: 07/18/24  6:05 PM   Specimen: Anterior Nasal Swab  Result Value Ref Range Status   SARS Coronavirus 2 by RT PCR NEGATIVE NEGATIVE Final    Comment: (NOTE) SARS-CoV-2 target nucleic acids are NOT DETECTED.  The SARS-CoV-2 RNA is generally detectable in upper respiratory specimens during the acute phase of infection. The lowest concentration of SARS-CoV-2 viral copies this assay can detect is 138 copies/mL. A negative result does not preclude SARS-Cov-2 infection and should not be used as the sole basis for treatment or other patient management decisions. A negative result may occur with  improper specimen collection/handling, submission of specimen other than nasopharyngeal  swab, presence of viral mutation(s) within the areas targeted by this assay, and inadequate number of viral copies(<138 copies/mL). A negative result must be combined with clinical observations, patient history, and epidemiological information. The expected result is Negative.  Fact Sheet for Patients:  bloggercourse.com  Fact Sheet for Healthcare Providers:  seriousbroker.it  This test is no t yet approved or cleared by the [place]  FDA and  has been authorized for detection and/or diagnosis of SARS-CoV-2 by FDA under an Emergency Use Authorization (EUA). This EUA will remain  in effect (meaning this test can be used) for the duration of the COVID-19 declaration under Section 564(b)(1) of the Act, 21 U.S.C.section 360bbb-3(b)(1), unless the authorization is terminated  or revoked sooner.       Influenza A by PCR NEGATIVE NEGATIVE Final   Influenza B by PCR NEGATIVE NEGATIVE Final    Comment: (NOTE) The Xpert Xpress SARS-CoV-2/FLU/RSV plus assay is intended as an aid in the diagnosis of influenza from Nasopharyngeal swab specimens and should not be used as a sole basis for treatment. Nasal washings and aspirates are unacceptable for Xpert Xpress SARS-CoV-2/FLU/RSV testing.  Fact Sheet for Patients: bloggercourse.com  Fact Sheet for Healthcare Providers: seriousbroker.it  This test is not yet approved or cleared by the [place]  FDA and has been authorized for detection and/or diagnosis of SARS-CoV-2 by FDA under an Emergency Use Authorization (EUA). This EUA will remain in effect (meaning this test can be used) for the duration of the COVID-19 declaration under Section 564(b)(1) of the Act, 21 U.S.C. section 360bbb-3(b)(1), unless the authorization is terminated or revoked.     Resp Syncytial Virus by PCR NEGATIVE NEGATIVE Final    Comment: (NOTE) Fact Sheet for  Patients: bloggercourse.com  Fact Sheet for Healthcare Providers: seriousbroker.it  This test is not yet approved or cleared by the [place]  FDA and has been authorized  for detection and/or diagnosis of SARS-CoV-2 by FDA under an Emergency Use Authorization (EUA). This EUA will remain in effect (meaning this test can be used) for the duration of the COVID-19 declaration under Section 564(b)(1) of the Act, 21 U.S.C. section 360bbb-3(b)(1), unless the authorization is terminated or revoked.  Performed at [facility], [address], [place], [place] [address]   Blood Culture (routine x 2)     Status: None   Collection Time: 07/18/24  6:11 PM   Specimen: Right Antecubital; Blood  Result Value Ref Range Status   Specimen Description   Final    RIGHT ANTECUBITAL Performed at [facility], [address], [place], [place] [address]    Special Requests   Final    BOTTLES DRAWN AEROBIC AND ANAEROBIC Blood Culture adequate volume Performed at [facility], [address], [place], [place] [address]    Culture   Final    NO GROWTH 5 DAYS Performed at [facility], [address], [place], [place] [address]    Report Status 07/23/2024 FINAL  Final  Urine Culture     Status: None   Collection Time: 07/19/24  2:01 AM   Specimen: Urine, Random  Result Value Ref Range Status   Specimen Description   Final    URINE, RANDOM Performed at [facility], [address] [place], [place] [address]    Special Requests   Final    NONE Reflexed from [phone] Performed at [facility], [address], [place], [place] [address]    Culture   Final    NO GROWTH Performed at [facility], [address], [place], [place] [address]    Report Status 07/20/2024 FINAL  Final  Blood Culture (routine x 2)     Status: None   Collection Time: 07/19/24  5:51  PM   Specimen: BLOOD RIGHT HAND  Result Value Ref Range Status   Specimen Description   Final    BLOOD RIGHT HAND Performed at [facility], [address], [place], [place] [address]    Special Requests   Final    BOTTLES DRAWN AEROBIC AND ANAEROBIC Blood Culture adequate volume Performed at [facility], [address] [place], [place] [address]    Culture   Final    NO GROWTH 5 DAYS Performed at [facility], [address], [place], [place] [address]    Report Status 07/24/2024 FINAL  Final         Radiology Studies: No results found.       Scheduled Meds:  apixaban   10 mg Oral BID   Followed by   NOREEN ON 07/28/2024] apixaban   5 mg Oral BID   brexpiprazole   1 mg Oral Daily   brimonidine   1 drop Both Eyes TID   donepezil   10 mg Oral QHS   dutasteride   0.5 mg Oral Daily   Gerhardt's butt cream   Topical BID   irbesartan   75 mg Oral Daily   magnesium  hydroxide  30 mL Oral Once   memantine   10 mg Oral BID   mouth rinse  15 mL Mouth Rinse 4 times per day   sertraline   100 mg Oral Daily   sodium chloride  flush  3 mL Intravenous Q12H   Continuous Infusions:     LOS: 7 days    Time spent: 40 minutes    Toribio Hummer, MD [facility]   To contact the attending provider between 7A-7P or the covering provider during after hours 7P-7A, please log into the web site [email or web address] and access using universal [place] password for that web site. If you do not have the password, please call the hospital operator.  07/25/2024, 10:21 AM    "

## 2024-07-25 NOTE — Plan of Care (Signed)

## 2024-07-25 NOTE — Care Management Important Message (Signed)
 Important Message  Patient Details IM Letter given. Name: [name] [name] MRN: [id number] Date of Birth: 07/27/1945   Important Message Given:  Yes - Medicare IM     Fady Stamps 07/25/2024, 9:32 AM

## 2024-07-26 DIAGNOSIS — I82461 Acute embolism and thrombosis of right calf muscular vein: Secondary | ICD-10-CM | POA: Diagnosis not present

## 2024-07-26 DIAGNOSIS — R262 Difficulty in walking, not elsewhere classified: Secondary | ICD-10-CM | POA: Diagnosis not present

## 2024-07-26 DIAGNOSIS — I2694 Multiple subsegmental pulmonary emboli without acute cor pulmonale: Secondary | ICD-10-CM | POA: Diagnosis not present

## 2024-07-26 DIAGNOSIS — F0390 Unspecified dementia without behavioral disturbance: Secondary | ICD-10-CM | POA: Diagnosis not present

## 2024-07-26 DIAGNOSIS — J9601 Acute respiratory failure with hypoxia: Secondary | ICD-10-CM | POA: Diagnosis not present

## 2024-07-26 NOTE — Progress Notes (Signed)
 Physical Therapy Treatment Patient Details Name: [name] [name] MRN: [id number] DOB: [birth year] Today's Date: 07/26/2024   History of Present Illness Patient is a 79 yo male presenting with weakness on 07/18/24. Recent ED visit on 1/12 with MRI negative. Admitted with pulmonary embolism. PMH includes:  dementia, type 2 diabetes, hypertension, aortic aneurysm    PT Comments  Pt was sleeping upon arrival. Pt's wife participated with treatment to assist with patient participation. Pt sat EOB with +2 total assist. Pt was able to tolerate sitting EOB with max assist while brushing hair/teeth. Pt became more agitated with tasks therefore we ended the treatment and returned supine in bed. Pt presents with significant dependencies in mobility affecting his independence due to decreased cognition and also strength. Pt has a tendency to resist movement making mobility more difficult. Pt will continue to benefit from acute skilled PT to maximize mobility and independence for the next venue of care.    If plan is discharge home, recommend the following: Two people to help with walking and/or transfers;Two people to help with bathing/dressing/bathroom;Direct supervision/assist for medications management;Assistance with cooking/housework;Direct supervision/assist for financial management;Assist for transportation;Help with stairs or ramp for entrance   Can travel by private vehicle     No  Equipment Recommendations  None recommended by PT    Recommendations for Other Services       Precautions / Restrictions Precautions Precautions: Fall Precaution/Restrictions Comments: baseline cog deficits secondary to advanced dementia Restrictions Weight Bearing Restrictions Per Provider Order: No     Mobility  Bed Mobility Overal bed mobility: Needs Assistance Bed Mobility: Supine to Sit Rolling: +2 for physical assistance, Total assist   Supine to sit: +2 for physical assistance, Total assist Sit to  supine: +2 for physical assistance, Total assist, HOB elevated   General bed mobility comments: decresed initiation, able to turn head in direction of roll and reach across with cuing, total assist with trunk and LEs    Transfers                        Ambulation/Gait                   Stairs             Wheelchair Mobility     Tilt Bed    Modified Rankin (Stroke Patients Only)       Balance Overall balance assessment: Needs assistance Sitting-balance support: Bilateral upper extremity supported, Feet supported Sitting balance-Leahy Scale: Zero Sitting balance - Comments: sat EOB with posterior support max assist while pt brushed hair, teeth, and focused on sitting balance and reaching tasks Postural control: Posterior lean                                  Communication Communication Communication: Impaired Factors Affecting Communication: Difficulty expressing self;Reduced clarity of speech  Cognition Arousal: Alert Behavior During Therapy: Restless, Agitated, Anxious   PT - Cognitive impairments: History of cognitive impairments, Orientation   Orientation impairments: Place, Time, Situation                     Following commands: Impaired Following commands impaired: Follows one step commands inconsistently    Cueing Cueing Techniques: Verbal cues, Gestural cues, Tactile cues, Visual cues  Exercises      General Comments General comments (skin integrity, edema, etc.): Pt's wife was present and assisted  with treatment which improved pt participation.      Pertinent Vitals/Pain Pain Assessment Pain Assessment: Faces Faces Pain Scale: Hurts little more Pain Location: with bed mobility Pain Descriptors / Indicators: Grimacing, Guarding Pain Intervention(s): Limited activity within patient's tolerance    Home Living                          Prior Function            PT Goals (current goals can  now be found in the care plan section) Progress towards PT goals: Not progressing toward goals - comment    Frequency    Min 2X/week      PT Plan      Co-evaluation              AM-PAC PT 6 Clicks Mobility   Outcome Measure  Help needed turning from your back to your side while in a flat bed without using bedrails?: Total Help needed moving from lying on your back to sitting on the side of a flat bed without using bedrails?: Total Help needed moving to and from a bed to a chair (including a wheelchair)?: Total Help needed standing up from a chair using your arms (e.g., wheelchair or bedside chair)?: Total Help needed to walk in hospital room?: Total Help needed climbing 3-5 steps with a railing? : Total 6 Click Score: 6    End of Session   Activity Tolerance: Treatment limited secondary to agitation Patient left: in bed;with family/visitor present;with call bell/phone within reach;with bed alarm set Nurse Communication: Need for lift equipment PT Visit Diagnosis: Muscle weakness (generalized) (M62.81);Other abnormalities of gait and mobility (R26.89);Difficulty in walking, not elsewhere classified (R26.2)     Time: 0950-1005 PT Time Calculation (min) (ACUTE ONLY): 15 min  Charges:    $Therapeutic Activity: 8-22 mins PT General Charges $$ ACUTE PT VISIT: 1 Visit                      Neriah Brott Kerstine 07/26/2024, 10:16 AM

## 2024-07-26 NOTE — Plan of Care (Signed)
" °  Problem: Education: Goal: Ability to describe self-care measures that may prevent or decrease complications (Diabetes Survival Skills Education) will improve Outcome: Adequate for Discharge Goal: Individualized Educational Video(s) Outcome: Adequate for Discharge   Problem: Clinical Measurements: Goal: Ability to maintain clinical measurements within normal limits will improve Outcome: Adequate for Discharge Goal: Will remain free from infection Outcome: Adequate for Discharge Goal: Diagnostic test results will improve Outcome: Adequate for Discharge Goal: Respiratory complications will improve Outcome: Adequate for Discharge Goal: Cardiovascular complication will be avoided Outcome: Adequate for Discharge   "

## 2024-07-26 NOTE — Progress Notes (Signed)
 " PROGRESS NOTE    [name] [name]  FMW:[id number] DOB: 22-Dec-1945 DOA: 07/18/2024 PCP: Clarice Nottingham, MD   Brief Narrative:  Patient 79 year old gentleman history of advanced dementia, type 2 diabetes, hypertension presented with severe weakness x 3 days.  Patient noted at baseline with the use of a walker, wife is primary caregiver however patient noted to have been significantly weak with difficulty ambulating over the past couple of days.  Patient noted to be leaning to the right side and he was brought to the ED on 07/17/2024 where MRI of the brain was done which was negative for any acute infarcts.  Patient noted to have a fever of 101 and subsequently brought to the ED.  CT chest abdomen and pelvis was done concerning for right lower lobe pulmonary embolism.  Patient placed on heparin  drip.  Patient also placed empirically on IV antibiotics pending infectious workup.  Patient admitted as above with right lower lobe PE and mild respiratory failure as well as right lower extremity DVT status post treatment, ongoing amatory dysfunction limits patient's status current plan to discharge patient to SNF for ongoing physical therapy and treatment once safe disposition has secured.     Assessment & Plan:   Principal Problem:   Pulmonary embolism (HCC) Active Problems:   Dementia without behavioral disturbance (HCC)   Acute deep vein thrombosis (DVT) of calf muscle vein of right lower extremity (HCC)   Ambulatory dysfunction   Acute hypoxemic respiratory failure (HCC)   Acute right lower lobe PE with acute respiratory failure requiring 2 L O2 nasal cannula Acute right lower extremity DVT involving right peroneal vein - CT chest abdomen and pelvis done was consistent with acute PE in the right lower lobe extending to segmental branches without CT evidence of right heart strain. - Lower extremity Dopplers done with an acute right lower extremity DVT involving the right peroneal vein. - 2D echo  ordered and done with a EF of 50 to 55%,NWMA, negative for right ventricular strain. - Patient was on IV heparin  and has been transitioned to Eliquis .    Advanced dementia - Continue Namenda , Aricept , Rexulti .  Hypertension - Continue Avapro .  Acute on chronic ambulatory dysfunction -baseline requires a walker, currently needs assistance above available care at home, discharged to SNF pending   Well-controlled diabetes mellitus type 2 - Hemoglobin A1c 5.6. - Resume home Ozempic  at discharge   Hypokalemia -Repleted.  DVT prophylaxis:  apixaban  (ELIQUIS ) tablet 10 mg  apixaban  (ELIQUIS ) tablet 5 mg  Code Status:   Code Status: Limited: Do not attempt resuscitation (DNR) -DNR-LIMITED -Do Not Intubate/DNI  Family Communication: Wife at bedside  Status is: Inpatient  Dispo: The patient is from: Home              Anticipated d/c is to: SNF              Anticipated d/c date is: Imminent              Patient currently is medically stable for discharge  Consultants:  None  Procedures:  None  Antimicrobials:  None indicated  Subjective: No acute issues or events noted overnight, review systems limited but patient denies pain or shortness of breath  Objective: Vitals:   07/25/24 0426 07/25/24 1618 07/25/24 2014 07/26/24 0449  BP: (!) 153/98 (!) 149/87 139/80 (!) 155/93  Pulse: 70 67 66 66  Resp: 18 16 18 18   Temp: 98.1 F (36.7 C) (!) 97.3 F (36.3 C) 98 F (36.7  C) 98.5 F (36.9 C)  TempSrc: Oral Oral Oral Oral  SpO2: 96% 95% 92% 95%  Weight:      Height:        Intake/Output Summary (Last 24 hours) at 07/26/2024 0750 Last data filed at 07/26/2024 0600 Gross per 24 hour  Intake 600 ml  Output 1850 ml  Net -1250 ml   Filed Weights   07/19/24 0852 07/23/24 0500 07/24/24 0701  Weight: 115.7 kg 112.7 kg 110.4 kg    Examination:  General:  Pleasantly resting in bed, No acute distress. HEENT:  Normocephalic atraumatic.  Sclerae nonicteric, noninjected.  Extraocular  movements intact bilaterally. Neck:  Without mass or deformity.  Trachea is midline. Lungs:  Clear to auscultate bilaterally without rhonchi, wheeze, or rales. Heart:  Regular rate and rhythm.  Without murmurs, rubs, or gallops. Abdomen:  Soft, nontender, nondistended.  Without guarding or rebound.  Data Reviewed: I have personally reviewed following labs and imaging studies  CBC: Recent Labs  Lab 07/20/24 0642 07/22/24 0633 07/23/24 0619 07/24/24 0533  WBC 10.5 9.1 9.5 9.6  NEUTROABS  --  6.3  --   --   HGB 11.2* 12.8* 12.4* 12.1*  HCT 33.0* 38.1* 36.4* 35.7*  MCV 97.6 93.6 94.1 94.7  PLT 137* 221 228 242   Basic Metabolic Panel: Recent Labs  Lab 07/20/24 0642 07/21/24 0706 07/22/24 0633 07/23/24 0619 07/24/24 0533  NA 138 137 137 135 136  K 3.7 3.4* 4.0 3.6 3.9  CL 107 102 101 100 101  CO2 23 28 25 26 26   GLUCOSE 116* 123* 131* 126* 107*  BUN 11 7* 7* 11 11  CREATININE 0.46* 0.45* 0.49* 0.54* 0.52*  CALCIUM  8.5* 8.4* 8.9 8.7* 8.7*  MG 2.1  --  2.0  --   --   PHOS 2.6  --   --   --   --    GFR: Estimated Creatinine Clearance: 94.5 mL/min (A) (by C-G formula based on SCr of 0.52 mg/dL (L)). Liver Function Tests: Recent Labs  Lab 07/20/24 0642  ALBUMIN 3.0*   CBG: Recent Labs  Lab 07/20/24 1728 07/20/24 2108 07/21/24 0743 07/21/24 1211 07/21/24 1632  GLUCAP 144* 110* 126* 138* 125*    Recent Results (from the past 240 hours)  Resp panel by RT-PCR (RSV, Flu A&B, Covid) Anterior Nasal Swab     Status: None   Collection Time: 07/18/24  6:05 PM   Specimen: Anterior Nasal Swab  Result Value Ref Range Status   SARS Coronavirus 2 by RT PCR NEGATIVE NEGATIVE Final    Comment: (NOTE) SARS-CoV-2 target nucleic acids are NOT DETECTED.  The SARS-CoV-2 RNA is generally detectable in upper respiratory specimens during the acute phase of infection. The lowest concentration of SARS-CoV-2 viral copies this assay can detect is 138 copies/mL. A negative result does  not preclude SARS-Cov-2 infection and should not be used as the sole basis for treatment or other patient management decisions. A negative result may occur with  improper specimen collection/handling, submission of specimen other than nasopharyngeal swab, presence of viral mutation(s) within the areas targeted by this assay, and inadequate number of viral copies(<138 copies/mL). A negative result must be combined with clinical observations, patient history, and epidemiological information. The expected result is Negative.  Fact Sheet for Patients:  bloggercourse.com  Fact Sheet for Healthcare Providers:  seriousbroker.it  This test is no t yet approved or cleared by the [place]  FDA and  has been authorized for detection and/or diagnosis of  SARS-CoV-2 by FDA under an Emergency Use Authorization (EUA). This EUA will remain  in effect (meaning this test can be used) for the duration of the COVID-19 declaration under Section 564(b)(1) of the Act, 21 U.S.C.section 360bbb-3(b)(1), unless the authorization is terminated  or revoked sooner.       Influenza A by PCR NEGATIVE NEGATIVE Final   Influenza B by PCR NEGATIVE NEGATIVE Final    Comment: (NOTE) The Xpert Xpress SARS-CoV-2/FLU/RSV plus assay is intended as an aid in the diagnosis of influenza from Nasopharyngeal swab specimens and should not be used as a sole basis for treatment. Nasal washings and aspirates are unacceptable for Xpert Xpress SARS-CoV-2/FLU/RSV testing.  Fact Sheet for Patients: bloggercourse.com  Fact Sheet for Healthcare Providers: seriousbroker.it  This test is not yet approved or cleared by the [place]  FDA and has been authorized for detection and/or diagnosis of SARS-CoV-2 by FDA under an Emergency Use Authorization (EUA). This EUA will remain in effect (meaning this test can be used) for the  duration of the COVID-19 declaration under Section 564(b)(1) of the Act, 21 U.S.C. section 360bbb-3(b)(1), unless the authorization is terminated or revoked.     Resp Syncytial Virus by PCR NEGATIVE NEGATIVE Final    Comment: (NOTE) Fact Sheet for Patients: bloggercourse.com  Fact Sheet for Healthcare Providers: seriousbroker.it  This test is not yet approved or cleared by the [place]  FDA and has been authorized for detection and/or diagnosis of SARS-CoV-2 by FDA under an Emergency Use Authorization (EUA). This EUA will remain in effect (meaning this test can be used) for the duration of the COVID-19 declaration under Section 564(b)(1) of the Act, 21 U.S.C. section 360bbb-3(b)(1), unless the authorization is terminated or revoked.  Performed at [facility], [address] [place], [place] [address]   Blood Culture (routine x 2)     Status: None   Collection Time: 07/18/24  6:11 PM   Specimen: Right Antecubital; Blood  Result Value Ref Range Status   Specimen Description   Final    RIGHT ANTECUBITAL Performed at [facility], [address], [place], [place] [address]    Special Requests   Final    BOTTLES DRAWN AEROBIC AND ANAEROBIC Blood Culture adequate volume Performed at [facility], [address], [place], [place] [address]    Culture   Final    NO GROWTH 5 DAYS Performed at [name], [address], [place], [place] [address]    Report Status 07/23/2024 FINAL  Final  Urine Culture     Status: None   Collection Time: 07/19/24  2:01 AM   Specimen: Urine, Random  Result Value Ref Range Status   Specimen Description   Final    URINE, RANDOM Performed at [facility], [address] [place], [place] [address]    Special Requests   Final    NONE Reflexed from [id number] Performed at [facility], [address],  [place], [place] [address]    Culture   Final    NO GROWTH Performed at [facility], [address] [place], [place] [address]    Report Status 07/20/2024 FINAL  Final  Blood Culture (routine x 2)     Status: None   Collection Time: 07/19/24  5:51 PM   Specimen: BLOOD RIGHT HAND  Result Value Ref Range Status   Specimen Description   Final    BLOOD RIGHT HAND Performed at [facility], [address], [place], [place] [address]    Special Requests   Final    BOTTLES DRAWN AEROBIC AND ANAEROBIC Blood Culture adequate volume Performed at [facility], [address], [place], [place] [address]    Culture   Final    NO GROWTH 5 DAYS Performed at [facility], [address] [place], [place] [address]    Report Status 07/24/2024 FINAL  Final         Radiology Studies: No results found.      Scheduled Meds:  apixaban   10 mg Oral BID   Followed by   NOREEN ON 07/28/2024] apixaban   5 mg Oral BID   brexpiprazole   1 mg Oral Daily   brimonidine   1 drop Both Eyes TID   donepezil   10 mg Oral QHS   dutasteride   0.5 mg Oral Daily   Gerhardt's butt cream   Topical BID   irbesartan   75 mg Oral Daily   magnesium  hydroxide  30 mL Oral Once   memantine   10 mg Oral BID   mouth rinse  15 mL Mouth Rinse 4 times per day   sertraline   100 mg Oral Daily   sodium chloride  flush  3 mL Intravenous Q12H   Continuous Infusions:   LOS: 8 days    Time spent:    Elsie JAYSON Montclair, DO [facility]  If 7PM-7AM, please contact night-coverage [email or web address]  07/26/2024, 7:50 AM      "

## 2024-07-26 NOTE — TOC Progression Note (Addendum)
 Transition of Care [facility]) - Progression Note    Patient Details  Name: [name] [name] MRN: [id number] Date of Birth: 05/05/1946  Transition of Care [facility] CM/SW Contact  Toy LITTIE Agar, RN Phone Number:[phone]  07/26/2024, 3:03 PM  Clinical Narrative:    DONALD remains under manuel review. PASRR approved  (PASRR # [id number]).  CM spoke with Randine at [facility] to confirm that patient can come tomorrow 1/22. SNF can not admit today due to it being so late in the day. D/c summary will need to be updated and has to be submitted no later that 3;30 pm in order to get meds from pharmacy. Wife at bedside updated. Wil plan for early morning d/c. Message sent to MD.    Expected Discharge Plan: Skilled Nursing Facility Barriers to Discharge: No Barriers Identified               Expected Discharge Plan and Services In-house Referral: NA Discharge Planning Services: CM Consult Post Acute Care Choice: Skilled Nursing Facility Living arrangements for the past 2 months: Single Family Home Expected Discharge Date: 07/24/24               DME Arranged: N/A DME Agency: NA       HH Arranged: NA HH Agency: NA         Social Drivers of Health (SDOH) Interventions SDOH Screenings   Food Insecurity: No Food Insecurity (07/19/2024)  Housing: Low Risk (07/19/2024)  Transportation Needs: No Transportation Needs (07/19/2024)  Utilities: Not At Risk (07/19/2024)  Social Connections: Unknown (07/19/2024)  Tobacco Use: Medium Risk (07/18/2024)    Readmission Risk Interventions    07/21/2024    3:21 PM  Readmission Risk Prevention Plan  Transportation Screening Complete  PCP or Specialist Appt within 5-7 Days Complete  Home Care Screening Complete  Medication Review (RN CM) Complete

## 2024-07-26 NOTE — NC FL2 (Signed)
 " [place]  MEDICAID FL2 LEVEL OF CARE FORM     IDENTIFICATION  Patient Name: [name] [name] Birthdate: [birth year] Sex: male Admission Date (Current Location): 07/18/2024  [facility] and [place] Number:  Producer, Television/film/video and Address:  [facility],  [address] [place], [place] [address]      Provider Number: [id number]  Attending Physician Name and Address:  Lue Elsie BROCKS, MD  Relative Name and Phone Number:  Selden Noteboom [phone]    Current Level of Care: Hospital Recommended Level of Care: Skilled Nursing Facility Prior Approval Number:    Date Approved/Denied:   PASRR Number: PASRR pending  Discharge Plan: SNF    Current Diagnoses: Patient Active Problem List   Diagnosis Date Noted   Acute hypoxemic respiratory failure (HCC) 07/24/2024   Dementia without behavioral disturbance (HCC) 07/19/2024   Acute deep vein thrombosis (DVT) of calf muscle vein of right lower extremity (HCC) 07/19/2024   Ambulatory dysfunction 07/19/2024   Pulmonary embolism (HCC) 07/18/2024   Aortic dilatation    Nonrheumatic mitral valve regurgitation    Aortic root dilation 07/25/2018   Aneurysm, ascending aorta 07/25/2018   Essential hypertension 07/25/2018   DM2 (diabetes mellitus, type 2) (HCC) 07/25/2018   Spinal stenosis of lumbar region with neurogenic claudication 07/25/2018   OSA on CPAP 07/25/2018   Hemorrhoids 07/26/2013   Left Ureteral stone 07/18/2011   Pyelonephritis 07/18/2011   Anal fissure 06/17/2011   History of laparoscopic adjustable gastric banding 06/17/2011    Orientation RESPIRATION BLADDER Height & Weight     Self  Normal Incontinent Weight: 110.4 kg Height:  6' (182.9 cm)  BEHAVIORAL SYMPTOMS/MOOD NEUROLOGICAL BOWEL NUTRITION STATUS     (n/a) Incontinent Diet (Regular diet)  AMBULATORY STATUS COMMUNICATION OF NEEDS Skin   Total Care Verbally Other (Comment) (redness noted to bilateral buttocks)                       Personal  Care Assistance Level of Assistance  Bathing, Feeding, Dressing Bathing Assistance: Maximum assistance Feeding assistance: Limited assistance Dressing Assistance: Maximum assistance     Functional Limitations Info  Sight, Hearing, Speech Sight Info: Adequate Hearing Info: Impaired Speech Info: Adequate    SPECIAL CARE FACTORS FREQUENCY  PT (By licensed PT), OT (By licensed OT)     PT Frequency: 5x/wk OT Frequency: 5x/wk            Contractures Contractures Info: Not present    Additional Factors Info  Code Status, Allergies, Psychotropic, Insulin  Sliding Scale, Isolation Precautions, Suctioning Needs Code Status Info: DNR Allergies Info: No known drug allergies Psychotropic Info: see d/c summary Insulin  Sliding Scale Info: see d/c summary Isolation Precautions Info: n/a Suctioning Needs: n/a   Current Medications (07/26/2024):  This is the current hospital active medication list Current Facility-Administered Medications  Medication Dose Route Frequency Provider Last Rate Last Admin   acetaminophen  (TYLENOL ) tablet 650 mg  650 mg Oral Q6H PRN Claiborne, Claudia, MD   650 mg at 07/23/24 2146   Or   acetaminophen  (TYLENOL ) suppository 650 mg  650 mg Rectal Q6H PRN Arthea Child, MD       apixaban  (ELIQUIS ) tablet 10 mg  10 mg Oral BID Sebastian Toribio GAILS, MD   10 mg at 07/25/24 2029   Followed by   NOREEN ON 07/28/2024] apixaban  (ELIQUIS ) tablet 5 mg  5 mg Oral BID Sebastian Toribio GAILS, MD       bisacodyl  (DULCOLAX) EC tablet 5 mg  5 mg Oral Daily PRN Claiborne, Claudia, MD       brexpiprazole  (REXULTI ) tablet 1 mg  1 mg Oral Daily Sebastian Toribio GAILS, MD   1 mg at 07/25/24 0935   brimonidine  (ALPHAGAN ) 0.2 % ophthalmic solution 1 drop  1 drop Both Eyes TID Sebastian Toribio GAILS, MD   1 drop at 07/25/24 2029   donepezil  (ARICEPT ) tablet 10 mg  10 mg Oral QHS Claiborne, Claudia, MD   10 mg at 07/25/24 0935   dutasteride  (AVODART ) capsule 0.5 mg  0.5 mg Oral Daily Claiborne,  Claudia, MD   0.5 mg at 07/25/24 0934   Gerhardt's butt cream   Topical BID Sebastian Toribio GAILS, MD   1 Application at 07/25/24 2029   irbesartan  (AVAPRO ) tablet 75 mg  75 mg Oral Daily Claiborne, Claudia, MD   75 mg at 07/25/24 0935   magnesium  hydroxide (MILK OF MAGNESIA) suspension 30 mL  30 mL Oral Once Arthea Child, MD       memantine  (NAMENDA ) tablet 10 mg  10 mg Oral BID Claiborne, Claudia, MD   10 mg at 07/25/24 2029   ondansetron  (ZOFRAN ) tablet 4 mg  4 mg Oral Q6H PRN Arthea Child, MD       Or   ondansetron  (ZOFRAN ) injection 4 mg  4 mg Intravenous Q6H PRN Claiborne, Claudia, MD       Oral care mouth rinse  15 mL Mouth Rinse 4 times per day Sebastian Toribio GAILS, MD   15 mL at 07/25/24 2029   Oral care mouth rinse  15 mL Mouth Rinse PRN Sebastian Toribio GAILS, MD       sertraline  (ZOLOFT ) tablet 100 mg  100 mg Oral Daily Claiborne, Claudia, MD   100 mg at 07/25/24 9063   sodium chloride  flush (NS) 0.9 % injection 3 mL  3 mL Intravenous Q12H Arthea Child, MD   3 mL at 07/25/24 2030     Discharge Medications: Please see discharge summary for a list of discharge medications.  Relevant Imaging Results:  Relevant Lab Results:   Additional Information SS# [id number]  Toy LITTIE Agar, RN     "

## 2024-07-27 DIAGNOSIS — F0390 Unspecified dementia without behavioral disturbance: Secondary | ICD-10-CM | POA: Diagnosis not present

## 2024-07-27 DIAGNOSIS — J9601 Acute respiratory failure with hypoxia: Secondary | ICD-10-CM | POA: Diagnosis not present

## 2024-07-27 DIAGNOSIS — I2694 Multiple subsegmental pulmonary emboli without acute cor pulmonale: Secondary | ICD-10-CM | POA: Diagnosis not present

## 2024-07-27 DIAGNOSIS — R262 Difficulty in walking, not elsewhere classified: Secondary | ICD-10-CM | POA: Diagnosis not present

## 2024-07-27 DIAGNOSIS — I82461 Acute embolism and thrombosis of right calf muscular vein: Secondary | ICD-10-CM | POA: Diagnosis not present

## 2024-07-27 NOTE — Progress Notes (Signed)
 [employer], [employer] [facility]) Hospital Liaison Note:  This is a current GUIDE patient with [facility].  Please call with any questions or concerns.  Eleanor Nail, LPN [facility] Liaison [phone]

## 2024-07-27 NOTE — Progress Notes (Signed)
 Report given to Mia at [facility] per this nurse regarding patient discharge/admission.

## 2024-07-27 NOTE — Discharge Summary (Signed)
 Physician Discharge Summary  [name] [name] FMW:[id number] DOB: [birth year] DOA: 07/18/2024  PCP: Clarice Nottingham, MD  Admit date: 07/18/2024 Discharge date: 07/27/2024  Admitted From: Home Disposition:  SNF  Recommendations for Outpatient Follow-up:  Follow up with PCP in 1-2 weeks  Discharge Condition:Stable  CODE STATUS:DNR  Diet recommendation: Low fat low carb diet as tolerated   Brief/Interim Summary: Patient 79 year old gentleman history of advanced dementia, type 2 diabetes, hypertension presented with severe weakness x 3 days.  Patient noted at baseline with the use of a walker, wife is primary caregiver however patient noted to have been significantly weak with difficulty ambulating over the past couple of days.  Patient noted to be leaning to the right side and he was brought to the ED on 07/17/2024 where MRI of the brain was done which was negative for any acute infarcts.  Patient noted to have a fever of 101 and subsequently brought to the ED.  CT chest abdomen and pelvis was done concerning for right lower lobe pulmonary embolism.  Patient placed on heparin  drip.  Patient also placed empirically on IV antibiotics pending infectious workup.   Patient admitted as above with right lower lobe PE and mild respiratory failure as well as right lower extremity DVT status post treatment, ongoing amatory dysfunction limits patient's status. Plan to discharge to SNF now that we have secured safe disposition.  Discharge Diagnoses:  Principal Problem:   Pulmonary embolism (HCC) Active Problems:   Dementia without behavioral disturbance (HCC)   Acute deep vein thrombosis (DVT) of calf muscle vein of right lower extremity (HCC)   Ambulatory dysfunction   Acute hypoxemic respiratory failure (HCC)  Acute right lower lobe PE with acute respiratory failure requiring 2 L O2 nasal cannula Acute right lower extremity DVT involving right peroneal vein - CT chest abdomen and pelvis done was consistent  with acute PE in the right lower lobe extending to segmental branches without CT evidence of right heart strain. - Lower extremity Dopplers done with an acute right lower extremity DVT involving the right peroneal vein. - 2D echo ordered and done with a EF of 50 to 55%,NWMA, negative for right ventricular strain. - Patient was on IV heparin  and has been transitioned to Eliquis .     Advanced dementia - Continue Namenda , Aricept , Rexulti .  Hypertension - Continue Avapro .  Acute on chronic ambulatory dysfunction -baseline requires a walker, currently needs assistance above available care at home, discharged to SNF pending    Well-controlled diabetes mellitus type 2 - Hemoglobin A1c 5.6. - Resume home Ozempic  at discharge   Hypokalemia -Repleted.  Discharge Instructions  Discharge Instructions     AMB Referral to [facility]   Complete by: As directed    Is patient currently on anticoagulation?: Yes   Diet general   Complete by: As directed    Increase activity slowly   Complete by: As directed       Allergies as of 07/27/2024   No Known Allergies      Medication List     PAUSE taking these medications    alfuzosin  10 MG 24 hr tablet Wait to take this until your doctor or other care provider tells you to start again. Commonly known as: UROXATRAL  Take 10 mg by mouth daily.   alfuzosin  10 MG 24 hr tablet Wait to take this until your doctor or other care provider tells you to start again. Commonly known as: UROXATRAL  Take 1 tablet (10 mg total) by mouth  daily.   amLODipine  5 MG tablet Wait to take this until your doctor or other care provider tells you to start again. Commonly known as: NORVASC  Take 1 tablet by mouth daily.   B-12 1000 MCG Tabs Wait to take this until your doctor or other care provider tells you to start again. 3,000 mcg.   TURMERIC-GINGER PO Wait to take this until your doctor or other care provider tells you to start again. Take 1  tablet by mouth daily.   Zinc 50 MG Tabs Wait to take this until your doctor or other care provider tells you to start again. Take 50 mg by mouth daily.       STOP taking these medications    amoxicillin  500 MG capsule Commonly known as: AMOXIL    aspirin EC 81 MG tablet   azithromycin  500 MG tablet Commonly known as: ZITHROMAX    cephALEXin  500 MG capsule Commonly known as: KEFLEX    clotrimazole-betamethasone cream Commonly known as: LOTRISONE   predniSONE  20 MG tablet Commonly known as: DELTASONE        TAKE these medications    acetaminophen  500 MG tablet Commonly known as: TYLENOL  Take 1,000 mg by mouth every 6 (six) hours as needed for moderate pain.   apixaban  5 MG Tabs tablet Commonly known as: ELIQUIS  Take 2 tablets (10 mg total) by mouth 2 (two) times daily for 4 days, THEN 1 tablet (5 mg total) 2 (two) times daily. Start taking on: July 24, 2024   brimonidine  0.2 % ophthalmic solution Commonly known as: ALPHAGAN  Place 1 drop into both eyes 3 (three) times daily.   docusate sodium  100 MG capsule Commonly known as: COLACE Take 200 mg by mouth daily as needed for mild constipation.   donepezil  10 MG tablet Commonly known as: ARICEPT  Take 1 tablet (10 mg total) by mouth at bedtime.   dutasteride  0.5 MG capsule Commonly known as: AVODART  Take 1 capsule (0.5 mg total) by mouth daily.   Gerhardt's butt cream Crea Apply 1 Application topically 2 (two) times daily for 10 days.   irbesartan  75 MG tablet Commonly known as: AVAPRO  Take 1 tablet (75 mg total) by mouth daily.   loperamide  2 MG capsule Commonly known as: IMODIUM  Take 1 capsule (2 mg total) by mouth every evening.   memantine  10 MG tablet Commonly known as: NAMENDA  Take 1 tablet (10 mg total) by mouth 2 (two) times daily. Start after finishing 5 mg tablets.   multivitamin tablet Take 1 tablet by mouth daily.   Ozempic  (2 MG/DOSE) 8 MG/3ML Sopn Generic drug: Semaglutide  (2  MG/DOSE) Inject 2 mg into the skin once a week. What changed: Another medication with the same name was removed. Continue taking this medication, and follow the directions you see here.   Rexulti  0.5 MG Tabs Generic drug: Brexpiprazole  Take 1 tablet (0.5 mg total) by mouth daily.   Rexulti  1 MG Tabs tablet Generic drug: brexpiprazole  Take 1 tablet (1 mg total) by mouth daily.   rosuvastatin  10 MG tablet Commonly known as: CRESTOR  TAKE 1 TABLET BY MOUTH EVERY DAY Start taking on: August 07, 2024 What changed: These instructions start on August 07, 2024. If you are unsure what to do until then, ask your doctor or other care provider.   sertraline  50 MG tablet Commonly known as: ZOLOFT  Take 1/2  tablet by mouth for 4 days then 1 tablet by mouh each morning   traZODone  50 MG tablet Commonly known as: DESYREL  Take 1 tablet (50 mg  total) by mouth at bedtime as needed for sleep. What changed:  when to take this reasons to take this   Vitamin C 500 MG Caps See admin instructions.   Vitamin D3 125 MCG (5000 UT) Tabs Take 5,000 Units by mouth daily.        Contact information for follow-up providers     MD at SNF Follow up.          Clarice Nottingham, MD. Schedule an appointment as soon as possible for a visit in 3 week(s).   Specialty: Internal Medicine Contact information: [address] [place] [address] [phone]              Contact information for after-discharge care     Destination     [facility], [place] .   Service: Skilled Nursing Contact information: [address] [place]  [phone]                    Allergies[1]  Consultations: None   Procedures/Studies: ECHOCARDIOGRAM COMPLETE Result Date: 07/19/2024    ECHOCARDIOGRAM REPORT   Patient Name:   [name] [name] Vanroekel Date of Exam: 07/19/2024 Medical Rec #:  [id number]     Height:       72.0 in Accession #:    [id number]     Weight:       255.1 lb Date of Birth:  Nov 21, 1945    BSA:          2.364 m Patient Age:    78 years      BP:           140/91 mmHg Patient Gender: M             HR:           69 bpm. Exam Location:  Inpatient Procedure: 2D Echo, 3D Echo, Cardiac Doppler, Color Doppler and Strain Analysis            (Both Spectral and Color Flow Doppler were utilized during            procedure). Indications:    Pulmonary Embolus  History:        Patient has prior history of Echocardiogram examinations, most                 recent 03/30/2024. CAD, Pulmonary HTN; Risk Factors:Hypertension,                 Diabetes and Sleep Apnea. Aortic Root Dilation with Ascending                 Aortic Aneurysm.  Sonographer:    Philomena Daring Referring Phys: [id number] DANIEL V THOMPSON  Sonographer Comments: Global longitudinal strain was attempted. IMPRESSIONS  1. Left ventricular ejection fraction, by estimation, is 50 to 55%. The left ventricle has low normal function. The left ventricle has no regional wall motion abnormalities. Left ventricular diastolic parameters were normal.  2. Right ventricular systolic function is normal. The right ventricular size is normal.  3. Left atrial size was mildly dilated.  4. The mitral valve is normal in structure. Trivial mitral valve regurgitation. No evidence of mitral stenosis.  5. The aortic valve is tricuspid. Aortic valve regurgitation is not visualized. No aortic stenosis is present.  6. Aortic dilatation noted. There is moderate dilatation of the aortic root, measuring 46 mm. FINDINGS  Left Ventricle: Left ventricular ejection fraction, by estimation, is 50 to 55%. The left ventricle has low  normal function. The left ventricle has no regional wall motion abnormalities. Strain was performed and the global longitudinal strain is indeterminate. The left ventricular internal cavity size was normal in size. There is no left ventricular hypertrophy. Left ventricular diastolic parameters were normal. Right  Ventricle: The right ventricular size is normal. Right ventricular systolic function is normal. Left Atrium: Left atrial size was mildly dilated. Right Atrium: Right atrial size was normal in size. Pericardium: There is no evidence of pericardial effusion. Mitral Valve: The mitral valve is normal in structure. Mild mitral annular calcification. Trivial mitral valve regurgitation. No evidence of mitral valve stenosis. Tricuspid Valve: The tricuspid valve is normal in structure. Tricuspid valve regurgitation is trivial. No evidence of tricuspid stenosis. Aortic Valve: The aortic valve is tricuspid. Aortic valve regurgitation is not visualized. No aortic stenosis is present. Pulmonic Valve: The pulmonic valve was normal in structure. Pulmonic valve regurgitation is not visualized. No evidence of pulmonic stenosis. Aorta: Aortic dilatation noted. There is moderate dilatation of the aortic root, measuring 46 mm. Venous: The inferior vena cava was not well visualized. IAS/Shunts: The interatrial septum was not well visualized.  LEFT VENTRICLE PLAX 2D LVIDd:         4.60 cm   Diastology LVIDs:         3.00 cm   LV e' medial:    8.38 cm/s LV PW:         1.00 cm   LV E/e' medial:  8.7 LV IVS:        1.00 cm   LV e' lateral:   8.70 cm/s LVOT diam:     2.30 cm   LV E/e' lateral: 8.4 LV SV:         94 LV SV Index:   40 LVOT Area:     4.15 cm LV IVRT:       100 msec                          3D Volume EF:                          3D EF:        60 %                          LV EDV:       200 ml                          LV ESV:       80 ml                          LV SV:        120 ml RIGHT VENTRICLE RV Basal diam:  3.10 cm RV Mid diam:    2.70 cm RV S prime:     9.14 cm/s TAPSE (M-mode): 2.2 cm LEFT ATRIUM             Index        RIGHT ATRIUM           Index LA diam:        4.20 cm 1.78 cm/m   RA Area:     19.30 cm LA Vol (A2C):   92.3 ml 39.05 ml/m  RA Volume:   49.90 ml  21.11 ml/m LA  Vol (A4C):   85.3 ml 36.09 ml/m LA  Biplane Vol: 90.3 ml 38.21 ml/m  AORTIC VALVE LVOT Vmax:   111.00 cm/s LVOT Vmean:  71.600 cm/s LVOT VTI:    0.226 m  AORTA Ao Root diam: 3.80 cm Ao Asc diam:  3.80 cm MITRAL VALVE MV Area (PHT): 4.12 cm    SHUNTS MV Decel Time: 184 msec    Systemic VTI:  0.23 m MV E velocity: 72.80 cm/s  Systemic Diam: 2.30 cm MV A velocity: 74.70 cm/s MV E/A ratio:  0.97 Redell Shallow MD Electronically signed by Redell Shallow MD Signature Date/Time: 07/19/2024/3:29:51 PM    Final    VAS US  LOWER EXTREMITY VENOUS (DVT) Result Date: 07/19/2024  Lower Venous DVT Study Patient Name:  [name] [name] Mcgloin  Date of Exam:   07/19/2024 Medical Rec #: [id number]      Accession #:    [id number] Date of Birth: [birth year]     Patient Gender: M Patient Age:   38 years Exam Location:  [facility] Procedure:      VAS US  LOWER EXTREMITY VENOUS (DVT) Referring Phys: TORIBIO HUMMER --------------------------------------------------------------------------------  Indications: Pulmonary embolism.  Comparison Study: No previous exams Performing Technologist: Jody Hill RVT, RDMS  Examination Guidelines: A complete evaluation includes B-mode imaging, spectral Doppler, color Doppler, and power Doppler as needed of all accessible portions of each vessel. Bilateral testing is considered an integral part of a complete examination. Limited examinations for reoccurring indications may be performed as noted. The reflux portion of the exam is performed with the patient in reverse Trendelenburg.  +---------+---------------+---------+-----------+----------+--------------+ RIGHT    CompressibilityPhasicitySpontaneityPropertiesThrombus Aging +---------+---------------+---------+-----------+----------+--------------+ CFV      Full           Yes      Yes                                 +---------+---------------+---------+-----------+----------+--------------+ SFJ      Full                                                         +---------+---------------+---------+-----------+----------+--------------+ FV Prox  Full           Yes      Yes                                 +---------+---------------+---------+-----------+----------+--------------+ FV Mid   Full           Yes      Yes                                 +---------+---------------+---------+-----------+----------+--------------+ FV DistalFull           Yes      Yes                                 +---------+---------------+---------+-----------+----------+--------------+ PFV      Full                                                        +---------+---------------+---------+-----------+----------+--------------+  POP      Full           Yes      Yes                                 +---------+---------------+---------+-----------+----------+--------------+ PTV      Full                                                        +---------+---------------+---------+-----------+----------+--------------+ PERO     None           No       No                   Acute          +---------+---------------+---------+-----------+----------+--------------+   +---------+---------------+---------+-----------+----------+------------------+ LEFT     CompressibilityPhasicitySpontaneityPropertiesThrombus Aging     +---------+---------------+---------+-----------+----------+------------------+ CFV      Full           Yes      Yes                                     +---------+---------------+---------+-----------+----------+------------------+ SFJ      Full                                                            +---------+---------------+---------+-----------+----------+------------------+ FV Prox  Full           Yes      Yes                                     +---------+---------------+---------+-----------+----------+------------------+ FV Mid   Full           Yes      Yes                                      +---------+---------------+---------+-----------+----------+------------------+ FV DistalFull           Yes      Yes                                     +---------+---------------+---------+-----------+----------+------------------+ PFV      Full                                                            +---------+---------------+---------+-----------+----------+------------------+ POP      Full           Yes      Yes                                     +---------+---------------+---------+-----------+----------+------------------+  PTV      None           No       No                   Acute - one of                                                           paired             +---------+---------------+---------+-----------+----------+------------------+ PERO     None           No       No                   Acute              +---------+---------------+---------+-----------+----------+------------------+ Soleal   None           No       No                   Acute              +---------+---------------+---------+-----------+----------+------------------+ Gastroc  None           No       No                   Acute              +---------+---------------+---------+-----------+----------+------------------+     Summary: RIGHT: - Findings consistent with acute deep vein thrombosis involving the right peroneal veins.  - There is no evidence of deep vein thrombosis in the lower extremity.  - No cystic structure found in the popliteal fossa.  LEFT: - Findings consistent with acute deep vein thrombosis involving the left peroneal veins, and left posterior tibial veins. Findings consistent with acute intramuscular thrombosis involving the left soleal veins, and left gastrocnemius veins. - A cystic structure is found in the popliteal fossa.  *See table(s) above for measurements and observations. Electronically signed by Lonni Gaskins MD on 07/19/2024 at 1:43:23 PM.     Final    CT CHEST ABDOMEN PELVIS W CONTRAST Result Date: 07/18/2024 EXAM: CT CHEST WITH CONTRAST 07/18/2024 08:42:25 PM TECHNIQUE: CT of the chest was performed with the administration of 100 mL iohexol  (OMNIPAQUE ) 300 MG/ML solution. Multiplanar reformatted images are provided for review. Automated exposure control, iterative reconstruction, and/or weight based adjustment of the mA/kV was utilized to reduce the radiation dose to as low as reasonably achievable. COMPARISON: 07/16/2023 and 10/07/2022. CLINICAL HISTORY: Sepsis, Leukocytosis, generalized weakness. FINDINGS: MEDIASTINUM: Heart: Extensive multivessel coronary artery calcifications. No CT evidence of right heart strain, however. Pericardium is unremarkable. The central airways are clear. Vessels: Intraluminal filling defect is identified within the right lower lobar pulmonary artery branching into several segmental pulmonary arteries in keeping with an acute pulmonary embolus. The central pulmonary arteries are enlarged in keeping with changes of pulmonary arterial hypertension. Mild atherosclerotic calcification within the thoracic aorta. No aortic aneurysm. LYMPH NODES: No mediastinal, hilar or axillary lymphadenopathy. LUNGS AND PLEURA: Small left basilar atelectasis. No focal consolidation or pulmonary edema. No pleural effusion or pneumothorax. SOFT TISSUES/BONES: Osseous structures are age appropriate. No acute bone abnormality. No lytic or blastic bone lesion. No acute abnormality of the soft  tissues. UPPER ABDOMEN: Limited images of the upper abdomen demonstrate a small hiatal hernia. Cholelithiasis is present. The gallbladder is distended. No superimposed pericholecystic inflammatory changes are identified, however, to suggest changes of acute cholecystitis. Circumferential rectal wall thickening and mild presacral edema may reflect changes of an infectious or inflammatory proctitis. No evidence of obstruction or perforation. Mild prostatic  hypertrophy. Moderate descending and sigmoid colonic diverticulosis without superimposed acute inflammatory change. The appendix is normal. The stomach, small bowel, and large bowel are otherwise unremarkable. Mild aortoiliac atherosclerotic calcification. IMPRESSION: 1. Acute pulmonary embolus in the right lower lobar pulmonary artery extending into segmental branches, without CT evidence of right heart strain. 2. Enlarged central pulmonary arteries, compatible with pulmonary arterial hypertension. 3. Circumferential rectal wall thickening and mild presacral edema, which may reflect infectious or inflammatory proctitis, without obstruction or perforation. 4. Cholelithiasis with gallbladder distention, without CT evidence of acute cholecystitis. 5. Extensive multivessel coronary artery calcifications. 6. Moderate descending and sigmoid colonic diverticulosis, without acute diverticulitis. 7. Small hiatal hernia. 8. RAF score aortic atherosclerosis (ICD10-I70.0). Electronically signed by: Dorethia Molt MD 07/18/2024 08:51 PM EST RP Workstation: [id number]   DG Chest Port 1 View Result Date: 07/18/2024 CLINICAL DATA:  Abnormal labs elevated white count EXAM: PORTABLE CHEST 1 VIEW COMPARISON:  10/04/2022 FINDINGS: Hypoventilatory changes with bronchovascular crowding. Cardiomegaly. Bibasilar atelectasis. No pneumothorax IMPRESSION: Hypoventilatory changes with bronchovascular crowding and bibasilar atelectasis. Cardiomegaly. Electronically Signed   By: Luke Bun M.D.   On: 07/18/2024 18:21   CT HEAD WO CONTRAST Result Date: 07/17/2024 CLINICAL DATA:  Dementia, right-sided weakness EXAM: CT HEAD WITHOUT CONTRAST TECHNIQUE: Contiguous axial images were obtained from the base of the skull through the vertex without intravenous contrast. RADIATION DOSE REDUCTION: This exam was performed according to the departmental dose-optimization program which includes automated exposure control, adjustment of the mA and/or  kV according to patient size and/or use of iterative reconstruction technique. COMPARISON:  MRI 04/01/2022 FINDINGS: CT HEAD: There is cerebral atrophy. Old lacunar infarct in the head of the caudate nucleus on the right. There is no hemorrhage. No acute ischemic changes. No mass lesion. The ventricles are normal. Skull/sinuses/orbits: No significant abnormality. IMPRESSION: Cerebral atrophy. Old lacunar infarct in the right caudate nucleus. No acute abnormality. Electronically Signed   By: Nancyann Burns M.D.   On: 07/17/2024 15:53   MR BRAIN WO CONTRAST Result Date: 07/17/2024 CLINICAL DATA:  Neuro deficit, acute stroke suspected EXAM: MRI HEAD WITHOUT CONTRAST TECHNIQUE: Multiplanar, multiecho pulse sequences of the brain and surrounding structures were obtained without intravenous contrast. COMPARISON:  April 01, 2022 FINDINGS: MRI brain: There is a small old infarct in the medial left occipital lobe with encephalomalacia. Old lacunar infarct in the right caudate nucleus. Old lacunar infarct in the right-side of the cerebellum. No acute infarct. The ventricles are normal. No mass lesion. There are normal flow signals in the carotid arteries and basilar artery. No significant bone marrow signal abnormality. No significant abnormality in the paranasal sinuses or soft tissues. IMPRESSION: 1. 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 Old lacunar infarcts in the right cerebellum and right caudate nucleus 3. No acute infarct 4. No change from 04/01/2022 Electronically Signed   By: Nancyann Burns M.D.   On: 07/17/2024 15:33     Subjective: No acute issues/events overnight   Discharge Exam: Vitals:   07/26/24 2005 07/27/24 0603  BP: (!) 153/95 (!) 146/80  Pulse: 75 69  Resp: 17 18  Temp: (!) 97.2 F (36.2 C) (!)  97.4 F (36.3 C)  SpO2:  90%   Vitals:   07/26/24 1448 07/26/24 2005 07/27/24 0546 07/27/24 0603  BP: (!) 140/84 (!) 153/95  (!) 146/80  Pulse: 64 75  69  Resp:  17  18  Temp:  97.8 F (36.6 C) (!) 97.2 F (36.2 C)  (!) 97.4 F (36.3 C)  TempSrc:  Oral    SpO2: 94%   90%  Weight:   107.3 kg   Height:        General: Pt is alert, awake, not in acute distress Cardiovascular: RRR, S1/S2 +, no rubs, no gallops Respiratory: CTA bilaterally, no wheezing, no rhonchi Abdominal: Soft, NT, ND, bowel sounds + Extremities: no edema, no cyanosis    The results of significant diagnostics from this hospitalization (including imaging, microbiology, ancillary and laboratory) are listed below for reference.     Microbiology: Recent Results (from the past 240 hours)  Resp panel by RT-PCR (RSV, Flu A&B, Covid) Anterior Nasal Swab     Status: None   Collection Time: 07/18/24  6:05 PM   Specimen: Anterior Nasal Swab  Result Value Ref Range Status   SARS Coronavirus 2 by RT PCR NEGATIVE NEGATIVE Final    Comment: (NOTE) SARS-CoV-2 target nucleic acids are NOT DETECTED.  The SARS-CoV-2 RNA is generally detectable in upper respiratory specimens during the acute phase of infection. The lowest concentration of SARS-CoV-2 viral copies this assay can detect is 138 copies/mL. A negative result does not preclude SARS-Cov-2 infection and should not be used as the sole basis for treatment or other patient management decisions. A negative result may occur with  improper specimen collection/handling, submission of specimen other than nasopharyngeal swab, presence of viral mutation(s) within the areas targeted by this assay, and inadequate number of viral copies(<138 copies/mL). A negative result must be combined with clinical observations, patient history, and epidemiological information. The expected result is Negative.  Fact Sheet for Patients:  bloggercourse.com  Fact Sheet for Healthcare Providers:  seriousbroker.it  This test is no t yet approved or cleared by the [place]  FDA and  has been authorized for detection  and/or diagnosis of SARS-CoV-2 by FDA under an Emergency Use Authorization (EUA). This EUA will remain  in effect (meaning this test can be used) for the duration of the COVID-19 declaration under Section 564(b)(1) of the Act, 21 U.S.C.section 360bbb-3(b)(1), unless the authorization is terminated  or revoked sooner.       Influenza A by PCR NEGATIVE NEGATIVE Final   Influenza B by PCR NEGATIVE NEGATIVE Final    Comment: (NOTE) The Xpert Xpress SARS-CoV-2/FLU/RSV plus assay is intended as an aid in the diagnosis of influenza from Nasopharyngeal swab specimens and should not be used as a sole basis for treatment. Nasal washings and aspirates are unacceptable for Xpert Xpress SARS-CoV-2/FLU/RSV testing.  Fact Sheet for Patients: bloggercourse.com  Fact Sheet for Healthcare Providers: seriousbroker.it  This test is not yet approved or cleared by the [place]  FDA and has been authorized for detection and/or diagnosis of SARS-CoV-2 by FDA under an Emergency Use Authorization (EUA). This EUA will remain in effect (meaning this test can be used) for the duration of the COVID-19 declaration under Section 564(b)(1) of the Act, 21 U.S.C. section 360bbb-3(b)(1), unless the authorization is terminated or revoked.     Resp Syncytial Virus by PCR NEGATIVE NEGATIVE Final    Comment: (NOTE) Fact Sheet for Patients: bloggercourse.com  Fact Sheet for Healthcare Providers: seriousbroker.it  This test is not  yet approved or cleared by the [place]  FDA and has been authorized for detection and/or diagnosis of SARS-CoV-2 by FDA under an Emergency Use Authorization (EUA). This EUA will remain in effect (meaning this test can be used) for the duration of the COVID-19 declaration under Section 564(b)(1) of the Act, 21 U.S.C. section 360bbb-3(b)(1), unless the authorization is terminated  or revoked.  Performed at [facility], [address] [place] [address] [place], [place] [address]   Blood Culture (routine x 2)     Status: None   Collection Time: 07/18/24  6:11 PM   Specimen: Right Antecubital; Blood  Result Value Ref Range Status   Specimen Description   Final    RIGHT ANTECUBITAL Performed at [facility], [address] [place], [place] [address]    Special Requests   Final    BOTTLES DRAWN AEROBIC AND ANAEROBIC Blood Culture adequate volume Performed at [facility], [address], [place], [place] [address]    Culture   Final    NO GROWTH 5 DAYS Performed at [facility], [address] [place], [place] [address]    Report Status 07/23/2024 FINAL  Final  Urine Culture     Status: None   Collection Time: 07/19/24  2:01 AM   Specimen: Urine, Random  Result Value Ref Range Status   Specimen Description   Final    URINE, RANDOM Performed at [facility], [address] [place], [place] [address]    Special Requests   Final    NONE Reflexed from [id number] Performed at [facility], [address] [place], [place] [address]    Culture   Final    NO GROWTH Performed at [facility], [address], [place], [place] [address]    Report Status 07/20/2024 FINAL  Final  Blood Culture (routine x 2)     Status: None   Collection Time: 07/19/24  5:51 PM   Specimen: BLOOD RIGHT HAND  Result Value Ref Range Status   Specimen Description   Final    BLOOD RIGHT HAND Performed at [facility], [address] [place], [place] [address]    Special Requests   Final    BOTTLES DRAWN AEROBIC AND ANAEROBIC Blood Culture adequate volume Performed at [facility], [address], [place], [place] [address]    Culture   Final    NO GROWTH 5 DAYS Performed at [facility], [address] [place], [place] [address]    Report Status 07/24/2024 FINAL  Final      Labs: BNP (last 3 results) No results for input(s): BNP in the last 8760 hours. Basic Metabolic Panel: Recent Labs  Lab 07/21/24 0706 07/22/24 0633 07/23/24 0619 07/24/24 0533  NA 137 137 135 136  K 3.4* 4.0 3.6 3.9  CL 102 101 100 101  CO2 28 25 26 26   GLUCOSE 123* 131* 126* 107*  BUN 7* 7* 11 11  CREATININE 0.45* 0.49* 0.54* 0.52*  CALCIUM  8.4* 8.9 8.7* 8.7*  MG  --  2.0  --   --    Liver Function Tests: No results for input(s): AST, ALT, ALKPHOS, BILITOT, PROT, ALBUMIN in the last 168 hours. No results for input(s): LIPASE, AMYLASE in the last 168 hours. No results for input(s): AMMONIA in the last 168 hours. CBC: Recent Labs  Lab 07/22/24 0633 07/23/24 0619 07/24/24 0533  WBC 9.1 9.5 9.6  NEUTROABS 6.3  --   --   HGB 12.8* 12.4* 12.1*  HCT 38.1* 36.4* 35.7*  MCV 93.6 94.1 94.7  PLT 221 228 242   Cardiac Enzymes: No results for input(s): CKTOTAL, CKMB, CKMBINDEX, TROPONINI in the last 168 hours. BNP: Invalid input(s): POCBNP CBG: Recent Labs  Lab 07/20/24 1728 07/20/24 2108 07/21/24 0743 07/21/24 1211 07/21/24 1632  GLUCAP 144* 110* 126* 138* 125*   D-Dimer No results for input(s): DDIMER in the last 72 hours. Hgb A1c No results for input(s): HGBA1C in the last 72 hours. Lipid Profile No results for input(s): CHOL, HDL, LDLCALC, TRIG, CHOLHDL, LDLDIRECT in the last 72 hours. Thyroid  function studies No results for input(s): TSH, T4TOTAL, T3FREE, THYROIDAB in the last 72 hours.  Invalid input(s): FREET3 Anemia work up No results for input(s): VITAMINB12, FOLATE, FERRITIN, TIBC, IRON, RETICCTPCT in the last 72 hours. Urinalysis    Component Value Date/Time   COLORURINE YELLOW 07/19/2024 0201   APPEARANCEUR CLEAR 07/19/2024 0201   LABSPEC >1.046 (H) 07/19/2024 0201   PHURINE 5.0 07/19/2024 0201   GLUCOSEU NEGATIVE 07/19/2024 0201   HGBUR MODERATE (A) 07/19/2024 0201    BILIRUBINUR NEGATIVE 07/19/2024 0201   KETONESUR NEGATIVE 07/19/2024 0201   PROTEINUR 30 (A) 07/19/2024 0201   UROBILINOGEN 4.0 (H) 10/04/2022 1042   NITRITE NEGATIVE 07/19/2024 0201   LEUKOCYTESUR TRACE (A) 07/19/2024 0201   Sepsis Labs Recent Labs  Lab 07/22/24 9366 07/23/24 0619 07/24/24 0533  WBC 9.1 9.5 9.6   Microbiology Recent Results (from the past 240 hours)  Resp panel by RT-PCR (RSV, Flu A&B, Covid) Anterior Nasal Swab     Status: None   Collection Time: 07/18/24  6:05 PM   Specimen: Anterior Nasal Swab  Result Value Ref Range Status   SARS Coronavirus 2 by RT PCR NEGATIVE NEGATIVE Final    Comment: (NOTE) SARS-CoV-2 target nucleic acids are NOT DETECTED.  The SARS-CoV-2 RNA is generally detectable in upper respiratory specimens during the acute phase of infection. The lowest concentration of SARS-CoV-2 viral copies this assay can detect is 138 copies/mL. A negative result does not preclude SARS-Cov-2 infection and should not be used as the sole basis for treatment or other patient management decisions. A negative result may occur with  improper specimen collection/handling, submission of specimen other than nasopharyngeal swab, presence of viral mutation(s) within the areas targeted by this assay, and inadequate number of viral copies(<138 copies/mL). A negative result must be combined with clinical observations, patient history, and epidemiological information. The expected result is Negative.  Fact Sheet for Patients:  bloggercourse.com  Fact Sheet for Healthcare Providers:  seriousbroker.it  This test is no t yet approved or cleared by the [place]  FDA and  has been authorized for detection and/or diagnosis of SARS-CoV-2 by FDA under an Emergency Use Authorization (EUA). This EUA will remain  in effect (meaning this test can be used) for the duration of the COVID-19 declaration under Section 564(b)(1)  of the Act, 21 U.S.C.section 360bbb-3(b)(1), unless the authorization is terminated  or revoked sooner.       Influenza A by PCR NEGATIVE NEGATIVE Final   Influenza B by PCR NEGATIVE NEGATIVE Final    Comment: (NOTE) The Xpert Xpress SARS-CoV-2/FLU/RSV plus assay is intended as an aid in the diagnosis of influenza from Nasopharyngeal swab specimens and should not be used as a sole basis for treatment. Nasal washings and aspirates are unacceptable for Xpert Xpress SARS-CoV-2/FLU/RSV testing.  Fact Sheet for Patients: bloggercourse.com  Fact  Sheet for Healthcare Providers: seriousbroker.it  This test is not yet approved or cleared by the [place]  FDA and has been authorized for detection and/or diagnosis of SARS-CoV-2 by FDA under an Emergency Use Authorization (EUA). This EUA will remain in effect (meaning this test can be used) for the duration of the COVID-19 declaration under Section 564(b)(1) of the Act, 21 U.S.C. section 360bbb-3(b)(1), unless the authorization is terminated or revoked.     Resp Syncytial Virus by PCR NEGATIVE NEGATIVE Final    Comment: (NOTE) Fact Sheet for Patients: bloggercourse.com  Fact Sheet for Healthcare Providers: seriousbroker.it  This test is not yet approved or cleared by the [place]  FDA and has been authorized for detection and/or diagnosis of SARS-CoV-2 by FDA under an Emergency Use Authorization (EUA). This EUA will remain in effect (meaning this test can be used) for the duration of the COVID-19 declaration under Section 564(b)(1) of the Act, 21 U.S.C. section 360bbb-3(b)(1), unless the authorization is terminated or revoked.  Performed at [facility], [address], [place], [place] [address]   Blood Culture (routine x 2)     Status: None   Collection Time: 07/18/24  6:11 PM   Specimen: Right  Antecubital; Blood  Result Value Ref Range Status   Specimen Description   Final    RIGHT ANTECUBITAL Performed at [facility], [address], [place], [place] [address]    Special Requests   Final    BOTTLES DRAWN AEROBIC AND ANAEROBIC Blood Culture adequate volume Performed at [facility], [address] [place], [place] [address]    Culture   Final    NO GROWTH 5 DAYS Performed at [facility], [address], [place], [place] [address]    Report Status 07/23/2024 FINAL  Final  Urine Culture     Status: None   Collection Time: 07/19/24  2:01 AM   Specimen: Urine, Random  Result Value Ref Range Status   Specimen Description   Final    URINE, RANDOM Performed at [facility], [address], [place], [place] [address]    Special Requests   Final    NONE Reflexed from [id number] Performed at [facility], [address] [place], [place] [address]    Culture   Final    NO GROWTH Performed at [facility], [address], [place], [place] [address]    Report Status 07/20/2024 FINAL  Final  Blood Culture (routine x 2)     Status: None   Collection Time: 07/19/24  5:51 PM   Specimen: BLOOD RIGHT HAND  Result Value Ref Range Status   Specimen Description   Final    BLOOD RIGHT HAND Performed at [facility], [address], [place], [place] [address]    Special Requests   Final    BOTTLES DRAWN AEROBIC AND ANAEROBIC Blood Culture adequate volume Performed at [facility], [address], [place], [place] [address]    Culture   Final    NO GROWTH 5 DAYS Performed at [facility], [address], [place], [place] [address]    Report Status 07/24/2024 FINAL  Final     Time coordinating discharge: Over 30 minutes  SIGNED:   Elsie JAYSON Montclair, DO [facility] 07/27/2024, 7:25 AM Pager   If 7PM-7AM, please contact night-coverage [email or web address]      [1] No Known Allergies

## 2024-07-27 NOTE — TOC Transition Note (Addendum)
 Transition of Care [facility]) - Discharge Note   Patient Details  Name: [name] [name] MRN: [id number] Date of Birth: 31-Oct-1945  [facility] CM/SW Contact:  Toy LITTIE Agar, RN Phone Number:[phone]  07/27/2024, 8:56 AM   Clinical Narrative:     Discharge summary and transfer report have been faxed to [facility]. Message has been left for [facility] with admissions at [facility]. Dishcharge packet has been placed in patients chart. Awaiting confirmation to call transport.   [id number] Transportation has been arranged per [facility]. Info for report has been sent to nurse Wife at bedside updated. No other needs noted. CM will sign off.    Final next level of care: Skilled Nursing Facility Barriers to Discharge: No Barriers Identified   Patient Goals and CMS Choice Patient states their goals for this hospitalization and ongoing recovery are:: Rehab facility CMS Medicare.gov Compare Post Acute Care list provided to:: Patient Represenative (must comment) (wife and son at bedside) Choice offered to / list presented to : Spouse, Patient, Adult Children [place] ownership interest in [facility].provided to:: Spouse    Discharge Placement              Patient chooses bed at: [facility], [place] Patient to be transferred to facility by: [facility] Name of family member notified: Sanchez Hemmer [phone] Patient and family notified of of transfer: 07/24/24  Discharge Plan and Services Additional resources added to the After Visit Summary for   In-house Referral: NA Discharge Planning Services: CM Consult Post Acute Care Choice: Skilled Nursing Facility          DME Arranged: N/A DME Agency: NA       HH Arranged: NA HH Agency: NA        Social Drivers of Health (SDOH) Interventions SDOH Screenings   Food Insecurity: No Food Insecurity (07/19/2024)  Housing: Low Risk (07/19/2024)  Transportation Needs: No Transportation Needs (07/19/2024)  Utilities: Not At Risk  (07/19/2024)  Social Connections: Unknown (07/19/2024)  Tobacco Use: Medium Risk (07/18/2024)     Readmission Risk Interventions    07/21/2024    3:21 PM  Readmission Risk Prevention Plan  Transportation Screening Complete  PCP or Specialist Appt within 5-7 Days Complete  Home Care Screening Complete  Medication Review (RN CM) Complete

## 2024-07-28 ENCOUNTER — Other Ambulatory Visit: Payer: Self-pay

## 2024-08-02 ENCOUNTER — Other Ambulatory Visit (HOSPITAL_COMMUNITY): Payer: Self-pay

## 2024-08-09 ENCOUNTER — Ambulatory Visit: Admitting: Pharmacist
